# Patient Record
Sex: Female | Born: 1950
Health system: Southern US, Community
[De-identification: ages and names within clinical notes are randomized; demographics above are authoritative.]

## PROBLEM LIST (undated history)

## (undated) DIAGNOSIS — I2510 Atherosclerotic heart disease of native coronary artery without angina pectoris: Secondary | ICD-10-CM

## (undated) DIAGNOSIS — K551 Chronic vascular disorders of intestine: Secondary | ICD-10-CM

## (undated) DIAGNOSIS — K219 Gastro-esophageal reflux disease without esophagitis: Secondary | ICD-10-CM

## (undated) DIAGNOSIS — E119 Type 2 diabetes mellitus without complications: Secondary | ICD-10-CM

## (undated) DIAGNOSIS — N189 Chronic kidney disease, unspecified: Secondary | ICD-10-CM

## (undated) DIAGNOSIS — R112 Nausea with vomiting, unspecified: Secondary | ICD-10-CM

## (undated) DIAGNOSIS — N200 Calculus of kidney: Secondary | ICD-10-CM

## (undated) DIAGNOSIS — Z9889 Other specified postprocedural states: Secondary | ICD-10-CM

## (undated) DIAGNOSIS — I5189 Other ill-defined heart diseases: Secondary | ICD-10-CM

## (undated) DIAGNOSIS — D649 Anemia, unspecified: Secondary | ICD-10-CM

## (undated) DIAGNOSIS — I10 Essential (primary) hypertension: Secondary | ICD-10-CM

## (undated) DIAGNOSIS — F32A Depression, unspecified: Secondary | ICD-10-CM

## (undated) DIAGNOSIS — M1990 Unspecified osteoarthritis, unspecified site: Secondary | ICD-10-CM

## (undated) DIAGNOSIS — J189 Pneumonia, unspecified organism: Secondary | ICD-10-CM

## (undated) DIAGNOSIS — J45909 Unspecified asthma, uncomplicated: Secondary | ICD-10-CM

## (undated) DIAGNOSIS — Z87442 Personal history of urinary calculi: Secondary | ICD-10-CM

## (undated) HISTORY — DX: Calculus of kidney: N20.0

## (undated) HISTORY — PX: ABDOMINAL HYSTERECTOMY: SHX81

## (undated) HISTORY — PX: BREAST BIOPSY: SHX20

## (undated) HISTORY — PX: REDUCTION MAMMAPLASTY: SUR839

## (undated) HISTORY — DX: Chronic vascular disorders of intestine: K55.1

## (undated) HISTORY — PX: TONSILLECTOMY: SUR1361

## (undated) HISTORY — DX: Atherosclerotic heart disease of native coronary artery without angina pectoris: I25.10

## (undated) HISTORY — DX: Other ill-defined heart diseases: I51.89

---

## 2004-07-05 ENCOUNTER — Ambulatory Visit: Payer: Self-pay

## 2004-07-25 ENCOUNTER — Ambulatory Visit: Payer: Self-pay | Admitting: Internal Medicine

## 2005-05-15 ENCOUNTER — Ambulatory Visit: Payer: Self-pay | Admitting: Unknown Physician Specialty

## 2005-05-16 ENCOUNTER — Ambulatory Visit: Payer: Self-pay | Admitting: Unknown Physician Specialty

## 2005-10-09 ENCOUNTER — Ambulatory Visit: Payer: Self-pay | Admitting: Internal Medicine

## 2005-10-24 ENCOUNTER — Ambulatory Visit: Payer: Self-pay | Admitting: Internal Medicine

## 2005-11-28 ENCOUNTER — Ambulatory Visit: Payer: Self-pay | Admitting: Internal Medicine

## 2006-07-07 ENCOUNTER — Emergency Department: Payer: Self-pay | Admitting: Emergency Medicine

## 2006-08-28 ENCOUNTER — Ambulatory Visit: Payer: Self-pay | Admitting: Internal Medicine

## 2008-07-05 ENCOUNTER — Ambulatory Visit: Payer: Self-pay | Admitting: Internal Medicine

## 2008-07-12 ENCOUNTER — Ambulatory Visit: Payer: Self-pay | Admitting: Internal Medicine

## 2009-01-23 ENCOUNTER — Ambulatory Visit: Payer: Self-pay | Admitting: Internal Medicine

## 2009-02-27 ENCOUNTER — Emergency Department: Payer: Self-pay | Admitting: Emergency Medicine

## 2009-07-04 ENCOUNTER — Other Ambulatory Visit: Payer: Self-pay | Admitting: Internal Medicine

## 2009-07-07 ENCOUNTER — Other Ambulatory Visit: Payer: Self-pay | Admitting: Internal Medicine

## 2009-08-22 ENCOUNTER — Ambulatory Visit: Payer: Self-pay | Admitting: Internal Medicine

## 2010-01-17 ENCOUNTER — Encounter: Payer: Self-pay | Admitting: General Practice

## 2010-01-26 ENCOUNTER — Ambulatory Visit: Payer: Self-pay | Admitting: General Practice

## 2010-01-28 ENCOUNTER — Encounter: Payer: Self-pay | Admitting: General Practice

## 2010-02-28 ENCOUNTER — Encounter: Payer: Self-pay | Admitting: General Practice

## 2010-04-26 ENCOUNTER — Other Ambulatory Visit: Payer: Self-pay | Admitting: Internal Medicine

## 2010-10-03 ENCOUNTER — Ambulatory Visit: Payer: Self-pay

## 2011-10-10 ENCOUNTER — Ambulatory Visit: Payer: Self-pay | Admitting: Surgery

## 2012-02-17 ENCOUNTER — Ambulatory Visit: Payer: Self-pay | Admitting: Internal Medicine

## 2012-05-27 ENCOUNTER — Ambulatory Visit: Payer: Self-pay | Admitting: Internal Medicine

## 2012-07-27 ENCOUNTER — Ambulatory Visit: Payer: Self-pay | Admitting: Bariatrics

## 2012-07-31 ENCOUNTER — Ambulatory Visit: Payer: Self-pay | Admitting: Bariatrics

## 2012-08-30 ENCOUNTER — Ambulatory Visit: Payer: Self-pay | Admitting: Bariatrics

## 2012-09-03 ENCOUNTER — Ambulatory Visit: Payer: Self-pay | Admitting: Bariatrics

## 2012-09-03 LAB — COMPREHENSIVE METABOLIC PANEL
Albumin: 3.5 g/dL (ref 3.4–5.0)
Alkaline Phosphatase: 89 U/L (ref 50–136)
Anion Gap: 6 — ABNORMAL LOW (ref 7–16)
BUN: 12 mg/dL (ref 7–18)
Bilirubin,Total: 0.3 mg/dL (ref 0.2–1.0)
Calcium, Total: 9.2 mg/dL (ref 8.5–10.1)
Chloride: 104 mmol/L (ref 98–107)
Co2: 27 mmol/L (ref 21–32)
Creatinine: 0.65 mg/dL (ref 0.60–1.30)
EGFR (African American): >60
EGFR (Non-African Amer.): >60
Glucose: 101 mg/dL — ABNORMAL HIGH (ref 65–99)
Osmolality: 274 (ref 275–301)
Potassium: 4 mmol/L (ref 3.5–5.1)
SGOT(AST): 31 U/L (ref 15–37)
SGPT (ALT): 40 U/L (ref 12–78)
Sodium: 137 mmol/L (ref 136–145)
Total Protein: 7.8 g/dL (ref 6.4–8.2)

## 2012-09-03 LAB — CBC WITH DIFFERENTIAL/PLATELET
Basophil #: 0 10*3/uL (ref 0.0–0.1)
Basophil %: 0.5 %
Eosinophil #: 0.1 10*3/uL (ref 0.0–0.7)
Eosinophil %: 1.8 %
HCT: 39.6 % (ref 35.0–47.0)
HGB: 13.2 g/dL (ref 12.0–16.0)
Lymphocyte #: 1.6 10*3/uL (ref 1.0–3.6)
Lymphocyte %: 29 %
MCH: 28.6 pg (ref 26.0–34.0)
MCHC: 33.3 g/dL (ref 32.0–36.0)
MCV: 86 fL (ref 80–100)
Monocyte #: 0.5 x10 3/mm (ref 0.2–0.9)
Monocyte %: 9.4 %
Neutrophil #: 3.3 10*3/uL (ref 1.4–6.5)
Neutrophil %: 59.3 %
Platelet: 164 10*3/uL (ref 150–440)
RBC: 4.6 10*6/uL (ref 3.80–5.20)
RDW: 13.1 % (ref 11.5–14.5)
WBC: 5.6 10*3/uL (ref 3.6–11.0)

## 2012-09-03 LAB — LIPASE, BLOOD: Lipase: 122 U/L (ref 73–393)

## 2012-09-03 LAB — TSH: Thyroid Stimulating Horm: 2.4 u[IU]/mL

## 2012-09-03 LAB — MAGNESIUM: Magnesium: 1.9 mg/dL

## 2012-09-03 LAB — PROTIME-INR
INR: 0.9
Prothrombin Time: 12.7 secs (ref 11.5–14.7)

## 2012-09-03 LAB — IRON AND TIBC
Iron Bind.Cap.(Total): 290 ug/dL (ref 250–450)
Iron Saturation: 21 %
Iron: 61 ug/dL (ref 50–170)
Unbound Iron-Bind.Cap.: 229 ug/dL

## 2012-09-03 LAB — AMYLASE: Amylase: 52 U/L (ref 25–115)

## 2012-09-03 LAB — FERRITIN: Ferritin (ARMC): 299 ng/mL (ref 8–388)

## 2012-09-03 LAB — PHOSPHORUS: Phosphorus: 3.2 mg/dL (ref 2.5–4.9)

## 2012-09-03 LAB — HEMOGLOBIN A1C: Hemoglobin A1C: 5.7 % (ref 4.2–6.3)

## 2012-09-03 LAB — APTT: Activated PTT: 28.5 secs (ref 23.6–35.9)

## 2012-09-03 LAB — FOLATE: Folic Acid: 19.3 ng/mL (ref 3.1–100.0)

## 2012-09-03 LAB — BILIRUBIN, DIRECT: Bilirubin, Direct: <0.1 mg/dL (ref 0.00–0.20)

## 2012-09-30 ENCOUNTER — Ambulatory Visit: Payer: Self-pay | Admitting: Bariatrics

## 2012-11-03 ENCOUNTER — Ambulatory Visit: Payer: Self-pay | Admitting: Internal Medicine

## 2012-11-26 ENCOUNTER — Ambulatory Visit: Payer: Self-pay | Admitting: Internal Medicine

## 2012-12-08 ENCOUNTER — Ambulatory Visit: Payer: Self-pay | Admitting: Bariatrics

## 2012-12-15 ENCOUNTER — Inpatient Hospital Stay: Payer: Self-pay | Admitting: Bariatrics

## 2012-12-15 LAB — CREATININE, SERUM
Creatinine: 0.6 mg/dL (ref 0.60–1.30)
EGFR (African American): >60
EGFR (Non-African Amer.): >60

## 2012-12-16 LAB — CBC WITH DIFFERENTIAL/PLATELET
Basophil #: 0 10*3/uL (ref 0.0–0.1)
Basophil %: 0.4 %
Eosinophil #: 0 10*3/uL (ref 0.0–0.7)
Eosinophil %: 0.7 %
HCT: 36 % (ref 35.0–47.0)
HGB: 11.9 g/dL — ABNORMAL LOW (ref 12.0–16.0)
Lymphocyte #: 1 10*3/uL (ref 1.0–3.6)
Lymphocyte %: 17.7 %
MCH: 28.2 pg (ref 26.0–34.0)
MCHC: 33 g/dL (ref 32.0–36.0)
MCV: 85 fL (ref 80–100)
Monocyte #: 0.5 x10 3/mm (ref 0.2–0.9)
Monocyte %: 9.8 %
Neutrophil #: 4 10*3/uL (ref 1.4–6.5)
Neutrophil %: 71.4 %
Platelet: 123 10*3/uL — ABNORMAL LOW (ref 150–440)
RBC: 4.23 10*6/uL (ref 3.80–5.20)
RDW: 12.9 % (ref 11.5–14.5)
WBC: 5.6 10*3/uL (ref 3.6–11.0)

## 2012-12-16 LAB — BASIC METABOLIC PANEL
Anion Gap: 8 (ref 7–16)
BUN: 6 mg/dL — ABNORMAL LOW (ref 7–18)
Calcium, Total: 8 mg/dL — ABNORMAL LOW (ref 8.5–10.1)
Chloride: 103 mmol/L (ref 98–107)
Co2: 25 mmol/L (ref 21–32)
Creatinine: 0.71 mg/dL (ref 0.60–1.30)
EGFR (African American): >60
EGFR (Non-African Amer.): >60
Glucose: 123 mg/dL — ABNORMAL HIGH (ref 65–99)
Osmolality: 271 (ref 275–301)
Potassium: 3.9 mmol/L (ref 3.5–5.1)
Sodium: 136 mmol/L (ref 136–145)

## 2012-12-16 LAB — ALBUMIN: Albumin: 3 g/dL — ABNORMAL LOW (ref 3.4–5.0)

## 2012-12-16 LAB — MAGNESIUM: Magnesium: 1.7 mg/dL — ABNORMAL LOW

## 2012-12-16 LAB — PHOSPHORUS: Phosphorus: 2.5 mg/dL (ref 2.5–4.9)

## 2012-12-21 LAB — PATHOLOGY REPORT

## 2012-12-23 ENCOUNTER — Other Ambulatory Visit: Payer: Self-pay | Admitting: Bariatrics

## 2012-12-23 LAB — COMPREHENSIVE METABOLIC PANEL
Albumin: 3.4 g/dL (ref 3.4–5.0)
Alkaline Phosphatase: 65 U/L (ref 50–136)
Anion Gap: 7 (ref 7–16)
BUN: 9 mg/dL (ref 7–18)
Bilirubin,Total: 0.4 mg/dL (ref 0.2–1.0)
Calcium, Total: 9.2 mg/dL (ref 8.5–10.1)
Chloride: 104 mmol/L (ref 98–107)
Co2: 26 mmol/L (ref 21–32)
Creatinine: 0.72 mg/dL (ref 0.60–1.30)
Glucose: 93 mg/dL (ref 65–99)
Osmolality: 272 (ref 275–301)
Potassium: 3.8 mmol/L (ref 3.5–5.1)
SGOT(AST): 31 U/L (ref 15–37)
SGPT (ALT): 39 U/L (ref 12–78)
Sodium: 137 mmol/L (ref 136–145)
Total Protein: 7.3 g/dL (ref 6.4–8.2)

## 2012-12-23 LAB — CBC WITH DIFFERENTIAL/PLATELET
Basophil #: 0 10*3/uL (ref 0.0–0.1)
Basophil %: 0.7 %
Eosinophil #: 0.4 10*3/uL (ref 0.0–0.7)
Eosinophil %: 8 %
HCT: 40.6 % (ref 35.0–47.0)
HGB: 13.6 g/dL (ref 12.0–16.0)
Lymphocyte #: 1 10*3/uL (ref 1.0–3.6)
Lymphocyte %: 18.2 %
MCH: 28.5 pg (ref 26.0–34.0)
MCHC: 33.5 g/dL (ref 32.0–36.0)
MCV: 85 fL (ref 80–100)
Monocyte #: 0.6 x10 3/mm (ref 0.2–0.9)
Monocyte %: 11.5 %
Neutrophil #: 3.3 10*3/uL (ref 1.4–6.5)
Neutrophil %: 61.6 %
Platelet: 191 10*3/uL (ref 150–440)
RBC: 4.77 10*6/uL (ref 3.80–5.20)
RDW: 13.3 % (ref 11.5–14.5)
WBC: 5.3 10*3/uL (ref 3.6–11.0)

## 2012-12-23 LAB — CLOSTRIDIUM DIFFICILE BY PCR

## 2013-01-07 ENCOUNTER — Ambulatory Visit: Payer: Self-pay | Admitting: Bariatrics

## 2013-01-28 ENCOUNTER — Ambulatory Visit: Payer: Self-pay | Admitting: Bariatrics

## 2013-06-21 ENCOUNTER — Other Ambulatory Visit: Payer: Self-pay | Admitting: General Practice

## 2013-06-21 LAB — COMPREHENSIVE METABOLIC PANEL
Albumin: 3.5 g/dL (ref 3.4–5.0)
Alkaline Phosphatase: 99 U/L (ref 50–136)
Anion Gap: 5 — ABNORMAL LOW (ref 7–16)
BUN: 20 mg/dL — ABNORMAL HIGH (ref 7–18)
Bilirubin,Total: 0.2 mg/dL (ref 0.2–1.0)
Calcium, Total: 8.8 mg/dL (ref 8.5–10.1)
Chloride: 106 mmol/L (ref 98–107)
Co2: 27 mmol/L (ref 21–32)
Creatinine: 0.74 mg/dL (ref 0.60–1.30)
EGFR (African American): >60
EGFR (Non-African Amer.): >60
Glucose: 93 mg/dL (ref 65–99)
Osmolality: 278 (ref 275–301)
Potassium: 4.2 mmol/L (ref 3.5–5.1)
SGOT(AST): 30 U/L (ref 15–37)
SGPT (ALT): 28 U/L (ref 12–78)
Sodium: 138 mmol/L (ref 136–145)
Total Protein: 7.6 g/dL (ref 6.4–8.2)

## 2013-06-21 LAB — PHOSPHORUS: Phosphorus: 3.1 mg/dL (ref 2.5–4.9)

## 2013-06-21 LAB — CBC WITH DIFFERENTIAL/PLATELET
Basophil #: 0 10*3/uL (ref 0.0–0.1)
Basophil %: 0.6 %
Eosinophil #: 0.2 10*3/uL (ref 0.0–0.7)
Eosinophil %: 2.5 %
HCT: 38.2 % (ref 35.0–47.0)
HGB: 13.1 g/dL (ref 12.0–16.0)
Lymphocyte #: 2.1 10*3/uL (ref 1.0–3.6)
Lymphocyte %: 33.7 %
MCH: 29.3 pg (ref 26.0–34.0)
MCHC: 34.3 g/dL (ref 32.0–36.0)
MCV: 85 fL (ref 80–100)
Monocyte #: 0.5 x10 3/mm (ref 0.2–0.9)
Monocyte %: 8.4 %
Neutrophil #: 3.4 10*3/uL (ref 1.4–6.5)
Neutrophil %: 54.8 %
Platelet: 133 10*3/uL — ABNORMAL LOW (ref 150–440)
RBC: 4.47 10*6/uL (ref 3.80–5.20)
RDW: 13.4 % (ref 11.5–14.5)
WBC: 6.2 10*3/uL (ref 3.6–11.0)

## 2013-06-21 LAB — MAGNESIUM: Magnesium: 1.9 mg/dL

## 2013-06-21 LAB — FOLATE: Folic Acid: 15 ng/mL (ref 3.1–100.0)

## 2013-06-21 LAB — IRON: Iron: 68 ug/dL (ref 50–170)

## 2013-06-21 LAB — FERRITIN: Ferritin (ARMC): 284 ng/mL (ref 8–388)

## 2013-06-21 LAB — AMYLASE: Amylase: 59 U/L (ref 25–115)

## 2014-01-05 ENCOUNTER — Other Ambulatory Visit: Payer: Self-pay | Admitting: Bariatrics

## 2014-01-05 LAB — CBC WITH DIFFERENTIAL/PLATELET
Basophil #: 0 x10 3/mm 3
Basophil %: 0.8 %
Eosinophil #: 0.1 x10 3/mm 3
Eosinophil %: 1.9 %
HCT: 40.6 %
HGB: 13.6 g/dL
Lymphocyte %: 42 %
Lymphs Abs: 2.2 x10 3/mm 3
MCH: 29.1 pg
MCHC: 33.6 g/dL
MCV: 87 fL
Monocyte #: 0.6 "x10 3/mm "
Monocyte %: 10.7 %
Neutrophil #: 2.3 x10 3/mm 3
Neutrophil %: 44.6 %
Platelet: 137 x10 3/mm 3 — ABNORMAL LOW
RBC: 4.7 X10 6/mm 3
RDW: 12.8 %
WBC: 5.3 x10 3/mm 3

## 2014-01-05 LAB — AMYLASE: Amylase: 63 U/L (ref 25–115)

## 2014-01-05 LAB — COMPREHENSIVE METABOLIC PANEL
Albumin: 3.7 g/dL (ref 3.4–5.0)
Alkaline Phosphatase: 81 U/L
Anion Gap: 6 — ABNORMAL LOW (ref 7–16)
BUN: 24 mg/dL — ABNORMAL HIGH (ref 7–18)
Bilirubin,Total: 0.3 mg/dL (ref 0.2–1.0)
Calcium, Total: 8.7 mg/dL (ref 8.5–10.1)
Chloride: 102 mmol/L (ref 98–107)
Co2: 27 mmol/L (ref 21–32)
Creatinine: 0.56 mg/dL — ABNORMAL LOW (ref 0.60–1.30)
EGFR (African American): >60
EGFR (Non-African Amer.): >60
Glucose: 77 mg/dL (ref 65–99)
Osmolality: 273 (ref 275–301)
Potassium: 3.9 mmol/L (ref 3.5–5.1)
SGOT(AST): 29 U/L (ref 15–37)
SGPT (ALT): 31 U/L (ref 12–78)
Sodium: 135 mmol/L — ABNORMAL LOW (ref 136–145)
Total Protein: 7.6 g/dL (ref 6.4–8.2)

## 2014-01-05 LAB — IRON: Iron: 58 ug/dL (ref 50–170)

## 2014-01-05 LAB — FOLATE: Folic Acid: 12.9 ng/mL

## 2014-01-05 LAB — PHOSPHORUS: Phosphorus: 3.9 mg/dL (ref 2.5–4.9)

## 2014-01-05 LAB — MAGNESIUM: Magnesium: 1.7 mg/dL — ABNORMAL LOW

## 2014-01-05 LAB — FERRITIN: Ferritin (ARMC): 237 ng/mL (ref 8–388)

## 2014-06-29 DIAGNOSIS — R1011 Right upper quadrant pain: Secondary | ICD-10-CM | POA: Insufficient documentation

## 2014-06-30 ENCOUNTER — Ambulatory Visit: Payer: Self-pay | Admitting: Gastroenterology

## 2014-09-14 ENCOUNTER — Encounter (INDEPENDENT_AMBULATORY_CARE_PROVIDER_SITE_OTHER): Payer: Self-pay | Admitting: Ophthalmology

## 2014-09-19 ENCOUNTER — Ambulatory Visit: Payer: Self-pay | Admitting: Unknown Physician Specialty

## 2014-10-06 ENCOUNTER — Encounter (INDEPENDENT_AMBULATORY_CARE_PROVIDER_SITE_OTHER): Payer: Self-pay | Admitting: Ophthalmology

## 2015-01-04 ENCOUNTER — Encounter: Payer: Self-pay | Admitting: *Deleted

## 2015-01-20 NOTE — Op Note (Signed)
PATIENT NAME:  April ChandlerDANLEY, Priscella B MR#:  [id number] DATE OF BIRTH:  10-25-1950  DATE OF PROCEDURE:  12/15/2012  PREOPERATIVE DIAGNOSIS: Morbid obesity with a body mass index of 44 hypertension, diabetes and presence of a hiatal hernia.   POSTOPERATIVE DIAGNOSIS:   Morbid obesity with a body mass index of 44 hypertension, diabetes and presence of a hiatal hernia.  PROCEDURE PERFORMED:  Laparoscopic sleeve gastrectomy with repair of hiatal hernia.  PHYSICIANS IN ATTENDANCE:  Effie ShyMichael Tyner, MD  PROCEDURE: The patient was brought to the operating room and placed in supine position. General anesthesia obtained with orotracheal intubation and a Foley catheter inserted sterilely. TED hose and Thromboguards were applied and a foot board applied at the end of the operative bed. The patient then had prep and drape of the lower chest and abdomen. A 5 mm Optiview was introduced under direct visualization in the left upper quadrant of the abdomen. Pneumoperitoneum was obtained with carbon dioxide. Three additional trocars were introduced across the upper abdomen. A Nathanson liver retractor introduced through a subxiphoid wound. The left lobe of the liver was elevated.  It should be noted that the patient did have evidence of moderate fibrosis of the liver consistent with cirrhosis. During the completion of the procedure, the patient did have core biopsies of the liver. This was not mentioned in the procedure  terminology.   Following elevation of the left lobe of the liver, the patient was noted to have a small hiatal hernia that was appreciated on preoperative upper endoscopy. The patient had division of the gastric pedicles along the greater curvature of the stomach beginning approximately 4 cm proximal to the pylorus. This was then extended superiorly with full division of the gastrocolic and gastrosplenic ligaments.  The upper fundus was freed from the undersurface of the left hemidiaphragm by use of the Harmonic  scalpel. The very apex of the stomach was lying within the lower mediastinum through the hiatus. Thin phrenoesophageal ligaments were divided along the medial margin of the left crural musculature. The patient then had division of the gastrohepatic ligament by use of the Harmonic scalpel. The peritoneum was incised just lateral to the right crural margin and blunt dissection was then used to reduce the herniated lesser sac fatty tissue.  Next, the patient underwent circumferential dissection of the lower esophagus from the mediastinum, separating the esophagus and vagal nerves from the underlying aorta, the pleural surfaces and the overlying pericardium. This was extended into the lower mediastinum over a distance of approximately 6 to 7 cm.  Ultimately we were able to deliver 2 cm of esophagus lying comfortably in the abdominal cavity. Two interrupted 0 Ethibond sutures were then used to approximate the crural musculature posteriorly. The patient at this time had a 4634 French bougie passed transorally and was directed down until the level of the antrum. This was then used as a template for creation of a medially based gastric sleeve or tube effect. The first firing of the GIA stapler was a green load Ethicon GIA stapler placed in a relative transverse direction in an effort to avoid any narrowing at the level of the incisura. Next, a somewhat more vertical line of staples were placed paralleling the lesser curvature and brought out just lateral to the angle of His. This resulted in a smooth transition from staple line to staple line.  There was a small dog ear of stomach left intact nearer than lateral to the angle of His. Next, the divided aspects of the  gastrosplenic and gastrocolic ligament were then secured to the lateral margin of the gastric sleeve and after fixating the stomach in the unbent reherniation into the lower mediastinum and a torsion of the sleeve.   At this time the lateral stomach delivered  through the right upper quadrant 15 mm trocar site. The fascia and peritoneum of this defect was then closed with a 0 Vicryl suture passed by way of a needle suture passing system under direct visualization. The staple line inspected for hemostasis and found to be excellent. The pneumoperitoneum was relieved at this time. The trocars were removed. It should be noted prior to complain reduction of the pneumoperitoneum a core biopsy of the left lobe of the liver was performed with a Tru-Cut needle biopsy system with 2 samples being retrieved. The wall of the liver was treated with Harmonic scalpel to achieve hemostasis at a needle puncture sites. Again, at this time the pneumoperitoneum was relieved. The sites were injected with 0.25 Marcaine followed by 4-0 Monocryl in the dermis followed by Dermabond. The patient was allowed to recover at this time having tolerated the procedure well with minimal blood loss.     ____________________________ Tyrone Apple Alva Garnet, MD mat:ct D: 12/15/2012 13:34:10 ET T: 12/15/2012 14:00:53 ET JOB#: [id number]  cc: Casimiro Needle A. Alva Garnet, MD, <Dictator> Tauseef G. Kathi Ludwig, MD Everette Rank MD ELECTRONICALLY SIGNED 12/16/2012 21:16

## 2015-01-20 NOTE — Consult Note (Signed)
Brief Consult Note: Diagnosis: Outpatient insulin prescription, DM, HTN.   Patient was seen by consultant.   Consult note dictated.   Orders entered.   Discussed with Attending MD.   Comments: 64y/o F with morbid obesity, HTn and DM who had a sleeve gastrectomy surgery and is being discharged home today, Medicine consult requested for outpt insulin prescription.  * DM- was on victoza at home, now that she had surgery and sugars well controlled in the hospital, also will be on liquid diet only for the next 2 weeks Per Dr. Alva Garnetyner, pt also had some weight loss lately. d/c victoza for now, started on humaLog kwikpen sliding scale adn gave instructions. pt has an appt with her endocrinologist Dr. Ellie LunchBhakti paul in 10days and advised to keep the appt  * HTN- also BP running low, advised to hold BP meds for now  * Morbid obesity- s/p sleeve gastrectomy sx. f/u with Dr. Alva Garnetyner.  Electronic Signatures: Enid BaasKalisetti, Kaylan Friedmann (MD)  (Signed 19-Mar-14 15:41)  Authored: Brief Consult Note   Last Updated: 19-Mar-14 15:41 by Enid BaasKalisetti, Jenai Scaletta (MD)

## 2015-01-20 NOTE — Consult Note (Signed)
PATIENT NAME:  April ChandlerDANLEY, Amariss B MR#:  [id number] DATE OF BIRTH:  Mar 23, 1951  DATE OF CONSULTATION:  12/16/2012  ADMITTING  PHYSICIAN:  Effie ShyMichael Tyner, MD  CONSULTING PHYSICIAN:  Enid Baasadhika Cruzito Standre, MD PRIMARY CARE PHYSICIAN: Corky DownsJaved Masoud, MD  PRIMARY ENDOCRINOLOGIST: Ellie LunchBhakti Paul, MD   REASON FOR CONSULTATION: Insulin regimen for discharge.  BRIEF HISTORY: The patient is a 64 year old morbidly obese female with a past medical history significant for hypertension, diabetes mellitus and morbid obesity, got admitted to the hospital by Dr. Alva Garnetyner for a scheduled sleeve gastrectomy procedure and repair of hiatal hernia.  She had her procedure done on 12/15/2012, did not have any postop complications. The patient has been a known diabetic for the past 10 years, but sugars in the hospital have been running in the 100 to 120 range. She will be going home on a liquid diet, and Dr. Alva Garnetyner requested outpatient diabetes management after the surgery to avoid hypoglycemia. The patient was seeing Dr. Renae FicklePaul from an endocrinology standpoint, she has been on Victoza 1.8 mg for the past 2 years. Her blood pressure has also been low normal postoperatively, and she was taking Benicar/HCTZ and verapamil.   PAST MEDICAL HISTORY: 1. Obesity.  2. Hypertension.  3. Diabetes mellitus.  4. Asthma.   PAST SURGICAL HISTORY: 1. Tonsillectomy.  2. Tubal ligation.  3. Hysterectomy with bilateral salpingo-oophorectomy. 4. Sleeve gastrectomy procedure done yesterday.   ALLERGIES TO MEDICATIONS: CODEINE, SULFA, AND PENICILLIN.   HOME MEDICATIONS: 1. Albuterol inhaler 2 puffs 4 times a day as needed for wheezing.  2. Estradiol 1 mg daily.  3. Lyrica 50 mg p.o. daily.  4. Zoloft 100 mg p.o. daily.  5. Victoza 1.8 mg daily.  6. Verapamil 80 mg 3 times a day.  7. Benicar/HCTZ 40/12.5 mg 1 tablet p.o. daily.   SOCIAL HISTORY: She lives at home with her husband. No history of smoking or alcohol use.   FAMILY HISTORY:  Significant for diabetes. Mom with coronary artery disease and also brain tumor.   REVIEW OF SYSTEMS:  CONSTITUTIONAL: No fever, fatigue or weakness.  EYES: No blurred vision, glaucoma, inflammation or cataracts.  ENT: No tinnitus, ear pain, epistaxis or discharge.  RESPIRATORY: No cough, wheeze, hemoptysis or COPD.  CARDIOVASCULAR: No chest pain, orthopnea, edema, arrhythmia, palpitations or syncope.  GASTROINTESTINAL: Positive for some nausea. No vomiting. No diarrhea. Positive for abdominal pain from her surgery. No hematemesis or melena.  GENITOURINARY: No dysuria, hematuria, renal calculus, frequency or incontinence.  ENDOCRINE: No polyuria, nocturia, thyroid problems, heat or cold intolerance.  HEMATOLOGY: No anemia, easy bruising or bleeding.  SKIN: No acne, rash or lesions.  MUSCULOSKELETAL: No neck, back, shoulder pain, arthritis or gout.  NEUROLOGICAL: No numbness, weakness, CVA, TIA, or seizures.  PSYCHOLOGICAL: No anxiety, insomnia, or depression.   PHYSICAL EXAMINATION: VITAL SIGNS: Temperature 98.3 degrees Fahrenheit, pulse 69, respirations 18, blood pressure 117/60, pulse oximetry 92% on room air.  GENERAL: A heavily-built, well-nourished female lying in bed, not in any acute distress.  HEENT: Normocephalic, atraumatic. Pupils are equal, round, reacting to light. Anicteric sclerae. Extraocular movements intact. Oropharynx clear without erythema, mass or exudates.  NECK: Supple, short and thick.  No thyromegaly, JVD or carotid bruits.  LYMPH: No lymphadenopathy.  LUNGS: Moving air bilaterally, clear breath sounds. No wheeze or crackles. No use of accessory muscles for breathing.  CARDIOVASCULAR: S1, S2, regular rate and rhythm. No murmurs, rubs, or gallops.   ABDOMEN: Soft, mild discomfort and soreness at the surgical site. She had laparoscopic  procedures, so multiple laparoscopic incisions are present. wound healing well. Normal bowel sounds.  EXTREMITIES: No pedal edema. No  clubbing or cyanosis, 2+ dorsalis pedis pulses palpable bilaterally.  SKIN: No acne, rash or lesions.  LYMPHATICS: No cervical lymphadenopathy.  NEUROLOGICAL: Cranial nerves intact. No focal motor or sensory deficits.  PSYCHOLOGICAL: The patient is awake, alert, oriented x 3.   LABORATORY DATA:  WBC is 5.6, hemoglobin 11.9, hematocrit 36.0, platelet count 123.  Sodium 136, potassium 3.9, chloride 103, bicarbonate 25, BUN 6, creatinine 0.71, glucose 123 and calcium of 8.0. Magnesium 1.7, phosphorus 2.5, albumin 3.0.   RECOMMENDATIONS: A 64 year old female with obesity, hypertension, diabetes, who had a sleeve gastrectomy done yesterday, being discharged today. Medicine consult requested for outpatient insulin prescription.   1. Diabetes mellitus: She was on Victoza at home.  Since she had the surgery, sugars are very well controlled, and she will be on a liquid diet for the next 2 weeks. Also, she has had recent weight loss, so we will discontinue the Victoza.  She was started on Humalog KwikPen sliding scale and given instructions. The patient already has an appointment with her endocrinologist, Dr. Ellie Lunch, in the next 10 days and was advised to keep the appointment.  2. Hypertension:  Also blood pressure is running low after her surgery, and she will be on a liquid diet; so, we will hold her Benicar/HCTZ and verapamil for now.  3. Asthma: Continue inhaler, currently appears stable. No need for any steroids.  4. Morbid obesity, status post sleeve gastrectomy:  Follow up with Dr. Effie Shy as scheduled.   CODE STATUS:  FULL CODE.          TIME SPENT IN CONSULTATION: 50 minutes.  ____________________________ Enid Baas, MD rk:cb D: 12/16/2012 15:40:57 ET T: 12/16/2012 15:55:18 ET JOB#: [id number]  cc: Enid Baas, MD, <Dictator> Bhakti B. Renae Fickle, MD Corky Downs, MD Enid Baas MD ELECTRONICALLY SIGNED 01/01/2013 14:43

## 2015-01-23 LAB — SURGICAL PATHOLOGY

## 2015-05-12 ENCOUNTER — Other Ambulatory Visit
Admission: RE | Admit: 2015-05-12 | Discharge: 2015-05-12 | Disposition: A | Discharge status: Home / Self Care | Payer: 59 | Source: Ambulatory Visit | Attending: Psychiatry | Admitting: Psychiatry

## 2015-05-12 DIAGNOSIS — F331 Major depressive disorder, recurrent, moderate: Secondary | ICD-10-CM | POA: Diagnosis not present

## 2015-05-12 LAB — FOLATE: Folate: 15.1 ng/mL (ref >5.9)

## 2015-05-12 LAB — VITAMIN B12: Vitamin B-12: 3067 pg/mL — ABNORMAL HIGH (ref 180–914)

## 2015-05-12 LAB — TSH: TSH: 2.193 u[IU]/mL (ref 0.350–4.500)

## 2015-10-04 DIAGNOSIS — H547 Unspecified visual loss: Secondary | ICD-10-CM | POA: Diagnosis not present

## 2015-10-04 DIAGNOSIS — H35371 Puckering of macula, right eye: Secondary | ICD-10-CM | POA: Diagnosis not present

## 2015-10-11 DIAGNOSIS — Z09 Encounter for follow-up examination after completed treatment for conditions other than malignant neoplasm: Secondary | ICD-10-CM | POA: Diagnosis not present

## 2015-10-11 DIAGNOSIS — F4310 Post-traumatic stress disorder, unspecified: Secondary | ICD-10-CM | POA: Diagnosis not present

## 2015-10-11 DIAGNOSIS — F331 Major depressive disorder, recurrent, moderate: Secondary | ICD-10-CM | POA: Diagnosis not present

## 2015-10-11 DIAGNOSIS — H35371 Puckering of macula, right eye: Secondary | ICD-10-CM | POA: Diagnosis not present

## 2015-10-14 DIAGNOSIS — J069 Acute upper respiratory infection, unspecified: Secondary | ICD-10-CM | POA: Diagnosis not present

## 2015-10-17 DIAGNOSIS — H40051 Ocular hypertension, right eye: Secondary | ICD-10-CM | POA: Diagnosis not present

## 2015-10-17 DIAGNOSIS — H1131 Conjunctival hemorrhage, right eye: Secondary | ICD-10-CM | POA: Diagnosis not present

## 2015-10-17 DIAGNOSIS — H40041 Steroid responder, right eye: Secondary | ICD-10-CM | POA: Diagnosis not present

## 2015-10-17 DIAGNOSIS — H2000 Unspecified acute and subacute iridocyclitis: Secondary | ICD-10-CM | POA: Diagnosis not present

## 2015-10-20 DIAGNOSIS — H40051 Ocular hypertension, right eye: Secondary | ICD-10-CM | POA: Diagnosis not present

## 2015-10-20 DIAGNOSIS — H2000 Unspecified acute and subacute iridocyclitis: Secondary | ICD-10-CM | POA: Diagnosis not present

## 2015-10-20 DIAGNOSIS — H1131 Conjunctival hemorrhage, right eye: Secondary | ICD-10-CM | POA: Diagnosis not present

## 2015-10-20 DIAGNOSIS — H40041 Steroid responder, right eye: Secondary | ICD-10-CM | POA: Diagnosis not present

## 2015-11-04 DIAGNOSIS — J208 Acute bronchitis due to other specified organisms: Secondary | ICD-10-CM | POA: Diagnosis not present

## 2015-11-04 DIAGNOSIS — J019 Acute sinusitis, unspecified: Secondary | ICD-10-CM | POA: Diagnosis not present

## 2015-11-04 DIAGNOSIS — B9689 Other specified bacterial agents as the cause of diseases classified elsewhere: Secondary | ICD-10-CM | POA: Diagnosis not present

## 2015-11-04 DIAGNOSIS — R300 Dysuria: Secondary | ICD-10-CM | POA: Diagnosis not present

## 2015-11-09 DIAGNOSIS — F331 Major depressive disorder, recurrent, moderate: Secondary | ICD-10-CM | POA: Diagnosis not present

## 2015-11-09 DIAGNOSIS — F4310 Post-traumatic stress disorder, unspecified: Secondary | ICD-10-CM | POA: Diagnosis not present

## 2015-11-13 DIAGNOSIS — H35371 Puckering of macula, right eye: Secondary | ICD-10-CM | POA: Diagnosis not present

## 2015-11-13 DIAGNOSIS — H35372 Puckering of macula, left eye: Secondary | ICD-10-CM | POA: Diagnosis not present

## 2015-11-28 DIAGNOSIS — H35372 Puckering of macula, left eye: Secondary | ICD-10-CM | POA: Diagnosis not present

## 2015-11-28 DIAGNOSIS — H462 Nutritional optic neuropathy: Secondary | ICD-10-CM | POA: Diagnosis not present

## 2015-11-28 DIAGNOSIS — H35371 Puckering of macula, right eye: Secondary | ICD-10-CM | POA: Diagnosis not present

## 2015-11-29 ENCOUNTER — Other Ambulatory Visit
Admission: RE | Admit: 2015-11-29 | Discharge: 2015-11-29 | Disposition: A | Discharge status: Home / Self Care | Payer: 59 | Source: Ambulatory Visit | Attending: Psychiatry | Admitting: Psychiatry

## 2015-11-29 DIAGNOSIS — Z79899 Other long term (current) drug therapy: Secondary | ICD-10-CM | POA: Insufficient documentation

## 2015-11-29 DIAGNOSIS — F331 Major depressive disorder, recurrent, moderate: Secondary | ICD-10-CM | POA: Diagnosis not present

## 2015-11-29 LAB — LIPID PANEL
Cholesterol: 259 mg/dL — ABNORMAL HIGH (ref 0–200)
HDL: 59 mg/dL (ref >40)
LDL Cholesterol: 151 mg/dL — ABNORMAL HIGH (ref 0–99)
Total CHOL/HDL Ratio: 4.4 RATIO
Triglycerides: 247 mg/dL — ABNORMAL HIGH (ref <150)
VLDL: 49 mg/dL — ABNORMAL HIGH (ref 0–40)

## 2016-01-12 DIAGNOSIS — F39 Unspecified mood [affective] disorder: Secondary | ICD-10-CM | POA: Diagnosis not present

## 2016-01-12 DIAGNOSIS — F4310 Post-traumatic stress disorder, unspecified: Secondary | ICD-10-CM | POA: Diagnosis not present

## 2016-01-15 DIAGNOSIS — M79671 Pain in right foot: Secondary | ICD-10-CM | POA: Diagnosis not present

## 2016-01-30 DIAGNOSIS — F39 Unspecified mood [affective] disorder: Secondary | ICD-10-CM | POA: Diagnosis not present

## 2016-01-31 DIAGNOSIS — M7671 Peroneal tendinitis, right leg: Secondary | ICD-10-CM | POA: Diagnosis not present

## 2016-01-31 DIAGNOSIS — M79671 Pain in right foot: Secondary | ICD-10-CM | POA: Diagnosis not present

## 2016-02-05 DIAGNOSIS — F331 Major depressive disorder, recurrent, moderate: Secondary | ICD-10-CM | POA: Diagnosis not present

## 2016-02-05 DIAGNOSIS — F4310 Post-traumatic stress disorder, unspecified: Secondary | ICD-10-CM | POA: Diagnosis not present

## 2016-02-12 DIAGNOSIS — F4310 Post-traumatic stress disorder, unspecified: Secondary | ICD-10-CM | POA: Diagnosis not present

## 2016-02-12 DIAGNOSIS — F331 Major depressive disorder, recurrent, moderate: Secondary | ICD-10-CM | POA: Diagnosis not present

## 2016-02-14 ENCOUNTER — Ambulatory Visit: Payer: Self-pay | Admitting: Physician Assistant

## 2016-02-14 ENCOUNTER — Encounter: Payer: Self-pay | Admitting: Physician Assistant

## 2016-02-14 VITALS — BP 152/90 | HR 80 | Temp 98.7°F

## 2016-02-14 MED ORDER — CLOTRIMAZOLE-BETAMETHASONE 1-0.05 % EX CREA: 1.0000 "application " | TOPICAL_CREAM | Freq: Two times a day (BID) | CUTANEOUS | Status: DC

## 2016-02-14 MED ORDER — FLUCONAZOLE 150 MG PO TABS: ORAL_TABLET | ORAL | Status: DC

## 2016-02-14 NOTE — Progress Notes (Signed)
S: c/o yeast under and between breasts, at [facility] [place], states has been using multiple otc powders without relief, states now her eyes are itching, ?if has fungus in her eyes, states she has macular degeneration and had surgery in Jan, no fever/chills, had recent lab work and her liver functions were ok  O: vitals wnl, nad, skin with pink inflamed areas under breasts and under panus of abdomen, some between her breasts, no drainage, n/v intact, conjunctiva of eyes is pale, no redness or drainage  A: candidiasis, itchy eyes  P: recommend pt contact her opth. Due to surgical intervention in January, lotrisone, diflucan 1 now and 1 on a week

## 2016-02-21 DIAGNOSIS — M7671 Peroneal tendinitis, right leg: Secondary | ICD-10-CM | POA: Diagnosis not present

## 2016-03-18 DIAGNOSIS — F4310 Post-traumatic stress disorder, unspecified: Secondary | ICD-10-CM | POA: Diagnosis not present

## 2016-03-18 DIAGNOSIS — F331 Major depressive disorder, recurrent, moderate: Secondary | ICD-10-CM | POA: Diagnosis not present

## 2016-03-27 DIAGNOSIS — M7671 Peroneal tendinitis, right leg: Secondary | ICD-10-CM | POA: Diagnosis not present

## 2016-04-08 DIAGNOSIS — F331 Major depressive disorder, recurrent, moderate: Secondary | ICD-10-CM | POA: Diagnosis not present

## 2016-04-15 DIAGNOSIS — F331 Major depressive disorder, recurrent, moderate: Secondary | ICD-10-CM | POA: Diagnosis not present

## 2016-04-17 DIAGNOSIS — F39 Unspecified mood [affective] disorder: Secondary | ICD-10-CM | POA: Diagnosis not present

## 2016-04-17 DIAGNOSIS — F4310 Post-traumatic stress disorder, unspecified: Secondary | ICD-10-CM | POA: Diagnosis not present

## 2016-04-22 DIAGNOSIS — F331 Major depressive disorder, recurrent, moderate: Secondary | ICD-10-CM | POA: Diagnosis not present

## 2016-05-09 DIAGNOSIS — I10 Essential (primary) hypertension: Secondary | ICD-10-CM | POA: Diagnosis not present

## 2016-05-09 DIAGNOSIS — R350 Frequency of micturition: Secondary | ICD-10-CM | POA: Diagnosis not present

## 2016-05-09 DIAGNOSIS — B369 Superficial mycosis, unspecified: Secondary | ICD-10-CM | POA: Diagnosis not present

## 2016-05-09 DIAGNOSIS — K219 Gastro-esophageal reflux disease without esophagitis: Secondary | ICD-10-CM | POA: Diagnosis not present

## 2016-05-09 DIAGNOSIS — K2960 Other gastritis without bleeding: Secondary | ICD-10-CM | POA: Diagnosis not present

## 2016-05-10 DIAGNOSIS — I10 Essential (primary) hypertension: Secondary | ICD-10-CM | POA: Diagnosis not present

## 2016-05-10 DIAGNOSIS — R5381 Other malaise: Secondary | ICD-10-CM | POA: Diagnosis not present

## 2016-05-21 DIAGNOSIS — G8929 Other chronic pain: Secondary | ICD-10-CM | POA: Diagnosis not present

## 2016-05-21 DIAGNOSIS — M2550 Pain in unspecified joint: Secondary | ICD-10-CM | POA: Diagnosis not present

## 2016-05-21 DIAGNOSIS — E785 Hyperlipidemia, unspecified: Secondary | ICD-10-CM | POA: Diagnosis not present

## 2016-05-21 DIAGNOSIS — I10 Essential (primary) hypertension: Secondary | ICD-10-CM | POA: Diagnosis not present

## 2016-05-21 DIAGNOSIS — M1990 Unspecified osteoarthritis, unspecified site: Secondary | ICD-10-CM | POA: Diagnosis not present

## 2016-05-21 DIAGNOSIS — M19042 Primary osteoarthritis, left hand: Secondary | ICD-10-CM | POA: Diagnosis not present

## 2016-05-21 DIAGNOSIS — M19041 Primary osteoarthritis, right hand: Secondary | ICD-10-CM | POA: Diagnosis not present

## 2016-05-28 DIAGNOSIS — R399 Unspecified symptoms and signs involving the genitourinary system: Secondary | ICD-10-CM | POA: Diagnosis not present

## 2016-06-04 DIAGNOSIS — G4700 Insomnia, unspecified: Secondary | ICD-10-CM | POA: Diagnosis not present

## 2016-06-04 DIAGNOSIS — F39 Unspecified mood [affective] disorder: Secondary | ICD-10-CM | POA: Diagnosis not present

## 2016-06-04 DIAGNOSIS — F4310 Post-traumatic stress disorder, unspecified: Secondary | ICD-10-CM | POA: Diagnosis not present

## 2016-06-10 DIAGNOSIS — M19041 Primary osteoarthritis, right hand: Secondary | ICD-10-CM | POA: Diagnosis not present

## 2016-06-10 DIAGNOSIS — G8929 Other chronic pain: Secondary | ICD-10-CM | POA: Insufficient documentation

## 2016-06-10 DIAGNOSIS — M2550 Pain in unspecified joint: Secondary | ICD-10-CM | POA: Diagnosis not present

## 2016-06-10 DIAGNOSIS — M79671 Pain in right foot: Secondary | ICD-10-CM | POA: Diagnosis not present

## 2016-06-11 ENCOUNTER — Other Ambulatory Visit: Payer: Self-pay | Admitting: Internal Medicine

## 2016-06-11 DIAGNOSIS — M79671 Pain in right foot: Secondary | ICD-10-CM

## 2016-06-25 ENCOUNTER — Ambulatory Visit
Admission: RE | Admit: 2016-06-25 | Discharge: 2016-06-25 | Disposition: A | Discharge status: Home / Self Care | Payer: 59 | Source: Ambulatory Visit | Attending: Internal Medicine | Admitting: Internal Medicine

## 2016-06-25 DIAGNOSIS — M19071 Primary osteoarthritis, right ankle and foot: Secondary | ICD-10-CM | POA: Diagnosis not present

## 2016-06-25 DIAGNOSIS — M7989 Other specified soft tissue disorders: Secondary | ICD-10-CM | POA: Insufficient documentation

## 2016-06-25 DIAGNOSIS — M79671 Pain in right foot: Secondary | ICD-10-CM

## 2016-06-27 DIAGNOSIS — F331 Major depressive disorder, recurrent, moderate: Secondary | ICD-10-CM | POA: Diagnosis not present

## 2016-06-27 DIAGNOSIS — F4310 Post-traumatic stress disorder, unspecified: Secondary | ICD-10-CM | POA: Diagnosis not present

## 2016-07-18 DIAGNOSIS — J983 Compensatory emphysema: Secondary | ICD-10-CM | POA: Diagnosis not present

## 2016-07-18 DIAGNOSIS — R5381 Other malaise: Secondary | ICD-10-CM | POA: Diagnosis not present

## 2016-07-18 DIAGNOSIS — E669 Obesity, unspecified: Secondary | ICD-10-CM | POA: Diagnosis not present

## 2016-07-18 DIAGNOSIS — F331 Major depressive disorder, recurrent, moderate: Secondary | ICD-10-CM | POA: Diagnosis not present

## 2016-07-18 DIAGNOSIS — K2960 Other gastritis without bleeding: Secondary | ICD-10-CM | POA: Diagnosis not present

## 2016-07-18 DIAGNOSIS — E119 Type 2 diabetes mellitus without complications: Secondary | ICD-10-CM | POA: Diagnosis not present

## 2016-08-01 DIAGNOSIS — H903 Sensorineural hearing loss, bilateral: Secondary | ICD-10-CM | POA: Diagnosis not present

## 2016-08-26 DIAGNOSIS — J983 Compensatory emphysema: Secondary | ICD-10-CM | POA: Diagnosis not present

## 2016-08-26 DIAGNOSIS — E119 Type 2 diabetes mellitus without complications: Secondary | ICD-10-CM | POA: Diagnosis not present

## 2016-08-26 DIAGNOSIS — E669 Obesity, unspecified: Secondary | ICD-10-CM | POA: Diagnosis not present

## 2016-08-26 DIAGNOSIS — E8881 Metabolic syndrome: Secondary | ICD-10-CM | POA: Diagnosis not present

## 2016-09-18 DIAGNOSIS — F5081 Binge eating disorder: Secondary | ICD-10-CM | POA: Diagnosis not present

## 2016-09-18 DIAGNOSIS — G4700 Insomnia, unspecified: Secondary | ICD-10-CM | POA: Diagnosis not present

## 2016-09-18 DIAGNOSIS — F4310 Post-traumatic stress disorder, unspecified: Secondary | ICD-10-CM | POA: Diagnosis not present

## 2016-09-18 DIAGNOSIS — F39 Unspecified mood [affective] disorder: Secondary | ICD-10-CM | POA: Diagnosis not present

## 2016-09-21 DIAGNOSIS — J4521 Mild intermittent asthma with (acute) exacerbation: Secondary | ICD-10-CM | POA: Diagnosis not present

## 2016-10-15 DIAGNOSIS — F5081 Binge eating disorder: Secondary | ICD-10-CM | POA: Diagnosis not present

## 2016-10-15 DIAGNOSIS — F39 Unspecified mood [affective] disorder: Secondary | ICD-10-CM | POA: Diagnosis not present

## 2016-10-15 DIAGNOSIS — F4310 Post-traumatic stress disorder, unspecified: Secondary | ICD-10-CM | POA: Diagnosis not present

## 2016-10-15 DIAGNOSIS — G4700 Insomnia, unspecified: Secondary | ICD-10-CM | POA: Diagnosis not present

## 2016-11-25 DIAGNOSIS — H35371 Puckering of macula, right eye: Secondary | ICD-10-CM | POA: Diagnosis not present

## 2016-11-29 DIAGNOSIS — J983 Compensatory emphysema: Secondary | ICD-10-CM | POA: Diagnosis not present

## 2016-11-29 DIAGNOSIS — E119 Type 2 diabetes mellitus without complications: Secondary | ICD-10-CM | POA: Diagnosis not present

## 2016-11-29 DIAGNOSIS — K739 Chronic hepatitis, unspecified: Secondary | ICD-10-CM | POA: Diagnosis not present

## 2016-11-29 DIAGNOSIS — I10 Essential (primary) hypertension: Secondary | ICD-10-CM | POA: Diagnosis not present

## 2016-12-19 DIAGNOSIS — H26492 Other secondary cataract, left eye: Secondary | ICD-10-CM | POA: Diagnosis not present

## 2017-01-02 DIAGNOSIS — I10 Essential (primary) hypertension: Secondary | ICD-10-CM | POA: Diagnosis not present

## 2017-01-02 DIAGNOSIS — E119 Type 2 diabetes mellitus without complications: Secondary | ICD-10-CM | POA: Diagnosis not present

## 2017-01-02 DIAGNOSIS — R0602 Shortness of breath: Secondary | ICD-10-CM | POA: Diagnosis not present

## 2017-01-02 DIAGNOSIS — K739 Chronic hepatitis, unspecified: Secondary | ICD-10-CM | POA: Diagnosis not present

## 2017-01-07 DIAGNOSIS — F4310 Post-traumatic stress disorder, unspecified: Secondary | ICD-10-CM | POA: Diagnosis not present

## 2017-01-07 DIAGNOSIS — F5081 Binge eating disorder: Secondary | ICD-10-CM | POA: Diagnosis not present

## 2017-01-07 DIAGNOSIS — G4700 Insomnia, unspecified: Secondary | ICD-10-CM | POA: Diagnosis not present

## 2017-01-07 DIAGNOSIS — F39 Unspecified mood [affective] disorder: Secondary | ICD-10-CM | POA: Diagnosis not present

## 2017-01-16 DIAGNOSIS — F331 Major depressive disorder, recurrent, moderate: Secondary | ICD-10-CM | POA: Diagnosis not present

## 2017-01-16 DIAGNOSIS — F4310 Post-traumatic stress disorder, unspecified: Secondary | ICD-10-CM | POA: Diagnosis not present

## 2017-01-22 DIAGNOSIS — K739 Chronic hepatitis, unspecified: Secondary | ICD-10-CM | POA: Diagnosis not present

## 2017-01-22 DIAGNOSIS — R0602 Shortness of breath: Secondary | ICD-10-CM | POA: Diagnosis not present

## 2017-01-22 DIAGNOSIS — R079 Chest pain, unspecified: Secondary | ICD-10-CM | POA: Diagnosis not present

## 2017-01-22 DIAGNOSIS — E119 Type 2 diabetes mellitus without complications: Secondary | ICD-10-CM | POA: Diagnosis not present

## 2017-01-22 DIAGNOSIS — I10 Essential (primary) hypertension: Secondary | ICD-10-CM | POA: Diagnosis not present

## 2017-01-23 DIAGNOSIS — E8881 Metabolic syndrome: Secondary | ICD-10-CM | POA: Diagnosis not present

## 2017-01-23 DIAGNOSIS — R42 Dizziness and giddiness: Secondary | ICD-10-CM | POA: Diagnosis not present

## 2017-01-23 DIAGNOSIS — R079 Chest pain, unspecified: Secondary | ICD-10-CM | POA: Diagnosis not present

## 2017-01-23 DIAGNOSIS — H26492 Other secondary cataract, left eye: Secondary | ICD-10-CM | POA: Diagnosis not present

## 2017-02-10 DIAGNOSIS — F39 Unspecified mood [affective] disorder: Secondary | ICD-10-CM | POA: Diagnosis not present

## 2017-02-10 DIAGNOSIS — Z79899 Other long term (current) drug therapy: Secondary | ICD-10-CM | POA: Diagnosis not present

## 2017-04-09 DIAGNOSIS — F4310 Post-traumatic stress disorder, unspecified: Secondary | ICD-10-CM | POA: Diagnosis not present

## 2017-04-09 DIAGNOSIS — G4700 Insomnia, unspecified: Secondary | ICD-10-CM | POA: Diagnosis not present

## 2017-04-09 DIAGNOSIS — F39 Unspecified mood [affective] disorder: Secondary | ICD-10-CM | POA: Diagnosis not present

## 2017-05-23 ENCOUNTER — Ambulatory Visit: Payer: Self-pay | Admitting: Family

## 2017-05-23 VITALS — BP 165/89 | HR 93 | Temp 98.5°F | Resp 16

## 2017-05-23 DIAGNOSIS — R350 Frequency of micturition: Secondary | ICD-10-CM

## 2017-05-23 LAB — POCT URINALYSIS DIPSTICK
Bilirubin, UA: NEGATIVE
Blood, UA: NEGATIVE
Glucose, UA: NEGATIVE
Leukocytes, UA: NEGATIVE
Nitrite, UA: POSITIVE
Protein, UA: NEGATIVE
Spec Grav, UA: >=1.03 — AB (ref 1.010–1.025)
Urobilinogen, UA: 1 E.U./dL
pH, UA: 5.5 (ref 5.0–8.0)

## 2017-05-23 MED ORDER — FLUCONAZOLE 150 MG PO TABS: 150.0000 mg | ORAL_TABLET | Freq: Once | ORAL | 0 refills | Status: AC

## 2017-05-23 MED ORDER — PHENAZOPYRIDINE HCL 200 MG PO TABS: 200.0000 mg | ORAL_TABLET | Freq: Three times a day (TID) | ORAL | 0 refills | Status: DC | PRN

## 2017-05-23 MED ORDER — NITROFURANTOIN MONOHYD MACRO 100 MG PO CAPS: 100.0000 mg | ORAL_CAPSULE | Freq: Two times a day (BID) | ORAL | 0 refills | Status: DC

## 2017-05-23 NOTE — Progress Notes (Signed)
S / 3-4 d h/o dysuria ,frequency , felt chilled yesterday, has been drinking diet sodas more and had constipation, h/o Recurrant UTIS   O/ alert , appears uncomfortable but not toxic ,BP up , afebrile- has had tylenol Heart rsr lungs clear, abd + sp tenderness   U/a with + nitrites , tr ketones A/ UTI  P /rx macrobid, diflucan, pyridium hydration encouraged.  If sxs not improving urged to seek urgent care.

## 2017-05-26 DIAGNOSIS — E119 Type 2 diabetes mellitus without complications: Secondary | ICD-10-CM | POA: Diagnosis not present

## 2017-05-26 DIAGNOSIS — J983 Compensatory emphysema: Secondary | ICD-10-CM | POA: Diagnosis not present

## 2017-05-26 DIAGNOSIS — K219 Gastro-esophageal reflux disease without esophagitis: Secondary | ICD-10-CM | POA: Diagnosis not present

## 2017-05-26 DIAGNOSIS — J399 Disease of upper respiratory tract, unspecified: Secondary | ICD-10-CM | POA: Diagnosis not present

## 2017-05-27 DIAGNOSIS — E119 Type 2 diabetes mellitus without complications: Secondary | ICD-10-CM | POA: Diagnosis not present

## 2017-06-16 DIAGNOSIS — R109 Unspecified abdominal pain: Secondary | ICD-10-CM | POA: Diagnosis not present

## 2017-06-16 DIAGNOSIS — R399 Unspecified symptoms and signs involving the genitourinary system: Secondary | ICD-10-CM | POA: Diagnosis not present

## 2017-06-16 DIAGNOSIS — Z87442 Personal history of urinary calculi: Secondary | ICD-10-CM | POA: Diagnosis not present

## 2017-07-08 DIAGNOSIS — F39 Unspecified mood [affective] disorder: Secondary | ICD-10-CM | POA: Diagnosis not present

## 2017-07-08 DIAGNOSIS — G4700 Insomnia, unspecified: Secondary | ICD-10-CM | POA: Diagnosis not present

## 2017-07-08 DIAGNOSIS — F4310 Post-traumatic stress disorder, unspecified: Secondary | ICD-10-CM | POA: Diagnosis not present

## 2017-07-08 DIAGNOSIS — F5081 Binge eating disorder: Secondary | ICD-10-CM | POA: Diagnosis not present

## 2017-07-09 DIAGNOSIS — E119 Type 2 diabetes mellitus without complications: Secondary | ICD-10-CM | POA: Diagnosis not present

## 2017-07-09 DIAGNOSIS — H35372 Puckering of macula, left eye: Secondary | ICD-10-CM | POA: Diagnosis not present

## 2017-07-24 DIAGNOSIS — N390 Urinary tract infection, site not specified: Secondary | ICD-10-CM | POA: Diagnosis not present

## 2017-07-24 DIAGNOSIS — N200 Calculus of kidney: Secondary | ICD-10-CM | POA: Diagnosis not present

## 2017-07-24 DIAGNOSIS — N23 Unspecified renal colic: Secondary | ICD-10-CM | POA: Diagnosis not present

## 2017-10-04 DIAGNOSIS — B0229 Other postherpetic nervous system involvement: Secondary | ICD-10-CM | POA: Diagnosis not present

## 2017-10-04 DIAGNOSIS — B0221 Postherpetic geniculate ganglionitis: Secondary | ICD-10-CM | POA: Diagnosis not present

## 2017-10-06 ENCOUNTER — Encounter: Payer: Self-pay | Admitting: Emergency Medicine

## 2017-10-06 ENCOUNTER — Emergency Department
Admission: EM | Admit: 2017-10-06 | Discharge: 2017-10-06 | Disposition: A | Discharge status: Home / Self Care | Payer: 59 | Attending: Emergency Medicine | Admitting: Emergency Medicine

## 2017-10-06 ENCOUNTER — Other Ambulatory Visit: Payer: Self-pay

## 2017-10-06 DIAGNOSIS — R21 Rash and other nonspecific skin eruption: Secondary | ICD-10-CM | POA: Diagnosis not present

## 2017-10-06 DIAGNOSIS — B029 Zoster without complications: Secondary | ICD-10-CM | POA: Insufficient documentation

## 2017-10-06 DIAGNOSIS — E119 Type 2 diabetes mellitus without complications: Secondary | ICD-10-CM | POA: Diagnosis not present

## 2017-10-06 DIAGNOSIS — J029 Acute pharyngitis, unspecified: Secondary | ICD-10-CM | POA: Diagnosis not present

## 2017-10-06 DIAGNOSIS — I10 Essential (primary) hypertension: Secondary | ICD-10-CM | POA: Insufficient documentation

## 2017-10-06 DIAGNOSIS — Z79899 Other long term (current) drug therapy: Secondary | ICD-10-CM | POA: Insufficient documentation

## 2017-10-06 DIAGNOSIS — J45909 Unspecified asthma, uncomplicated: Secondary | ICD-10-CM | POA: Diagnosis not present

## 2017-10-06 DIAGNOSIS — R509 Fever, unspecified: Secondary | ICD-10-CM | POA: Diagnosis not present

## 2017-10-06 HISTORY — DX: Type 2 diabetes mellitus without complications: E11.9

## 2017-10-06 HISTORY — DX: Unspecified asthma, uncomplicated: J45.909

## 2017-10-06 HISTORY — DX: Essential (primary) hypertension: I10

## 2017-10-06 LAB — CBC WITH DIFFERENTIAL/PLATELET
Basophils Absolute: 0 10*3/uL (ref 0–0.1)
Basophils Relative: 0 %
Eosinophils Absolute: 0 10*3/uL (ref 0–0.7)
Eosinophils Relative: 1 %
HCT: 42.7 % (ref 35.0–47.0)
Hemoglobin: 14.4 g/dL (ref 12.0–16.0)
Lymphocytes Relative: 14 %
Lymphs Abs: 0.6 10*3/uL — ABNORMAL LOW (ref 1.0–3.6)
MCH: 29 pg (ref 26.0–34.0)
MCHC: 33.8 g/dL (ref 32.0–36.0)
MCV: 85.8 fL (ref 80.0–100.0)
Monocytes Absolute: 0.7 10*3/uL (ref 0.2–0.9)
Monocytes Relative: 15 %
Neutro Abs: 3.3 10*3/uL (ref 1.4–6.5)
Neutrophils Relative %: 70 %
Platelets: 149 10*3/uL — ABNORMAL LOW (ref 150–440)
RBC: 4.97 MIL/uL (ref 3.80–5.20)
RDW: 12.9 % (ref 11.5–14.5)
WBC: 4.7 10*3/uL (ref 3.6–11.0)

## 2017-10-06 LAB — COMPREHENSIVE METABOLIC PANEL
ALT: 24 U/L (ref 14–54)
AST: 31 U/L (ref 15–41)
Albumin: 3.7 g/dL (ref 3.5–5.0)
Alkaline Phosphatase: 92 U/L (ref 38–126)
Anion gap: 8 (ref 5–15)
BUN: 9 mg/dL (ref 6–20)
CO2: 25 mmol/L (ref 22–32)
Calcium: 8.8 mg/dL — ABNORMAL LOW (ref 8.9–10.3)
Chloride: 100 mmol/L — ABNORMAL LOW (ref 101–111)
Creatinine, Ser: 0.62 mg/dL (ref 0.44–1.00)
GFR calc Af Amer: >60 mL/min (ref >60)
GFR calc non Af Amer: >60 mL/min (ref >60)
Glucose, Bld: 167 mg/dL — ABNORMAL HIGH (ref 65–99)
Potassium: 4.2 mmol/L (ref 3.5–5.1)
Sodium: 133 mmol/L — ABNORMAL LOW (ref 135–145)
Total Bilirubin: 0.6 mg/dL (ref 0.3–1.2)
Total Protein: 7.5 g/dL (ref 6.5–8.1)

## 2017-10-06 MED ORDER — SODIUM CHLORIDE 0.9 % IV BOLUS (SEPSIS)
1000.0000 mL | Freq: Once | INTRAVENOUS | Status: AC
  Administered 2017-10-06: 1000 mL via INTRAVENOUS

## 2017-10-06 MED ORDER — LIDOCAINE VISCOUS 2 % MT SOLN
5.0000 mL | Freq: Once | OROMUCOSAL | Status: AC
  Administered 2017-10-06: 5 mL via OROMUCOSAL
  Filled 2017-10-06: qty 15

## 2017-10-06 MED ORDER — MAGIC MOUTHWASH
5.0000 mL | Freq: Once | ORAL | Status: AC
  Administered 2017-10-06: 5 mL via ORAL
  Filled 2017-10-06: qty 10

## 2017-10-06 MED ORDER — ONDANSETRON HCL 4 MG/2ML IJ SOLN
4.0000 mg | Freq: Once | INTRAMUSCULAR | Status: AC
  Administered 2017-10-06: 4 mg via INTRAVENOUS
  Filled 2017-10-06: qty 2

## 2017-10-06 MED ORDER — LIDOCAINE VISCOUS 2 % MT SOLN: OROMUCOSAL | 0 refills | Status: DC

## 2017-10-06 NOTE — ED Provider Notes (Signed)
[facility] Emergency Department Provider Note  ___________________________________________   First MD Initiated Contact with Patient 10/06/17 [phone]     (approximate)  I have reviewed the triage vital signs and the nursing notes.   HISTORY  Chief Complaint Herpes Zoster   HPI April Singleton is a 67 y.o. female is here with complaint of right-sided  And facial rash with swelling. Patient states that she saw one of the doctors at [facility] and started on antiviral medication 2 days ago. She was diagnosed with herpes zoster. Patient is continue taking medication as directed. She states that her mouth is now extremely sore making it difficult for her to drink fluids. She denies any fever or chills. She continues to take Norco and Valtrex 1000mg   3 times a day.  patient rates her pain as 8 out of 10.   Past Medical History:  Diagnosis Date  . Asthma   . Diabetes mellitus without complication (HCC)   . Hypertension     There are no active problems to display for this patient.   Past Surgical History:  Procedure Laterality Date  . ABDOMINAL HYSTERECTOMY    . BREAST BIOPSY    . TONSILLECTOMY      Prior to Admission medications   Medication Sig Start Date End Date Taking? Authorizing Provider  carbamazepine (TEGRETOL) 200 MG tablet Take 200 mg by mouth 3 (three) times daily.   Yes [provider]  citalopram (CELEXA) 20 MG tablet Take 20 mg by mouth daily.   Yes [provider]  lidocaine (XYLOCAINE) 2 % solution 1-2 tsp swish and swallow as needed for throat pain q 4 hours 10/06/17   Tommi RumpsSummers, Christl Fessenden L, PA-C  nitrofurantoin, macrocrystal-monohydrate, (MACROBID) 100 MG capsule Take 1 capsule (100 mg total) by mouth 2 (two) times daily. 05/23/17   Francesco SorMoore, Tommie Anne, NP  phenazopyridine (PYRIDIUM) 200 MG tablet Take 1 tablet (200 mg total) by mouth 3 (three) times daily as needed for pain. 05/23/17   Francesco SorMoore, Tommie Anne, NP  verapamil  (CALAN-SR) 180 MG CR tablet  04/04/17   [provider]    Allergies Codeine; Penicillins; Prednisone; and Sulfa antibiotics  No family history on file.  Social History Social History   Tobacco Use  . Smoking status: Never Smoker  Substance Use Topics  . Alcohol use: Not on file  . Drug use: Not on file    Review of Systems Constitutional: subjective fever/chills Eyes: No visual changes. ENT: positive sore throat. Cardiovascular: Denies chest pain. Respiratory: Denies shortness of breath. Gastrointestinal: No abdominal pain.  positive nausea, no vomiting.  Skin: positive for rash. Neurological: Negative for headaches, focal weakness or numbness. ____________________________________________   PHYSICAL EXAM:  VITAL SIGNS: ED Triage Vitals  Enc Vitals Group     BP 10/06/17 0744 (!) 179/76     Pulse Rate 10/06/17 0744 95     Resp 10/06/17 0744 18     Temp 10/06/17 0744 98.5 F (36.9 C)     Temp Source 10/06/17 0744 Oral     SpO2 10/06/17 0744 98 %     Weight 10/06/17 0744 220 lb (99.8 kg)     Height 10/06/17 0744 5\' 1"  (1.549 m)     Head Circumference --      Peak Flow --      Pain Score 10/06/17 0752 8     Pain Loc --      Pain Edu? --      Excl. in  GC? --    Constitutional: Alert and oriented. Well appearing and in no acute distress. Eyes: Conjunctivae are normal.  Head: Atraumatic. Nose: No congestion/rhinnorhea. Skin around her nose is erythematous with postnasal present at the nostril. No active drainage. Mouth/Throat: Mucous membranes are moist.  Oropharynx  With right sided erythema and individual vesicles on the soft palate. Uvula is midline.patient is able to talk in complete sentences. Neck: No stridor.   Hematological/Lymphatic/Immunilogical: No cervical lymphadenopathy.  Cardiovascular: Normal rate, regular rhythm. Grossly normal heart sounds.  Good peripheral circulation. Respiratory: Normal respiratory effort.  No retractions. Lungs  CTAB. Gastrointestinal: Soft and nontender. No distention.  Musculoskeletal: moves upper and lower extremities without difficulty. Normal gait and without assistance. Neurologic:  Normal speech and language. No gross focal neurologic deficits are appreciated. No gait instability. Skin:  Skin is warm, dry.  Erythematous macular area on the right side of face including cheek and right side of nose.Vesicles present with pustule just below the nostril to the right.No drainage and no open areas present. Psychiatric: Mood and affect are normal. Speech and behavior are normal.  ____________________________________________   LABS (all labs ordered are listed, but only abnormal results are displayed)  Labs Reviewed  CBC WITH DIFFERENTIAL/PLATELET - Abnormal; Notable for the following components:      Result Value   Platelets 149 (*)    Lymphs Abs 0.6 (*)    All other components within normal limits  COMPREHENSIVE METABOLIC PANEL - Abnormal; Notable for the following components:   Sodium 133 (*)    Chloride 100 (*)    Glucose, Bld 167 (*)    Calcium 8.8 (*)    All other components within normal limits     PROCEDURES  Procedure(s) performed: None  Procedures  Critical Care performed: No  ____________________________________________   INITIAL IMPRESSION / ASSESSMENT AND PLAN / ED COURSE Patient was given normal saline 1 L while in the department and began to improve. Patient was sitting in a chair prior to discharge. Patient will continue taking Valtrex and Norco as directed. Patient has an appointment with her ophthalmologist at [facility] tomorrow. She is given a prescription for viscous lidocaine to swish in her mouth for throat pain to enable her to drink fluids without pain. Patient and husband were told to return to the emergency Department if any worsening of her symptoms. In talking with the husband and patient was recently exposed to her daughter who had shingles and also  a grandchild who has chickenpox. ____________________________________________   FINAL CLINICAL IMPRESSION(S) / ED DIAGNOSES  Final diagnoses:  Herpes zoster without complication     ED Discharge Orders        Ordered    lidocaine (XYLOCAINE) 2 % solution     10/06/17 1505       Note:  This document was prepared using Dragon voice recognition software and may include unintentional dictation errors.    Tommi Rumps, PA-C 10/06/17 1534    Minna Antis, MD 10/07/17 2318

## 2017-10-06 NOTE — ED Notes (Addendum)
See triage note  Developed rash to right side of face several days ago  Was [facility] and placed on Valtrex and given norco for the pain  States pain is worse  States she now has swelling to right side of face and feels likes throat is swollen

## 2017-10-06 NOTE — Discharge Instructions (Signed)
Continue your shingles medication as prescribed by your doctor. Continue pain medication as directed. Keep your appointment with [facility] tomorrow. Return to the emergency department if any severe worsening of her symptoms.

## 2017-10-06 NOTE — ED Triage Notes (Signed)
Here for recheck. Began rash 5 days ago R face. States was seen by MD and started on antiviral [facility]. States painful in mouth. Still taking antiviral.

## 2017-10-06 NOTE — ED Notes (Signed)
Up to bathroom

## 2017-10-09 DIAGNOSIS — G4700 Insomnia, unspecified: Secondary | ICD-10-CM | POA: Diagnosis not present

## 2017-10-09 DIAGNOSIS — H538 Other visual disturbances: Secondary | ICD-10-CM | POA: Diagnosis not present

## 2017-10-09 DIAGNOSIS — F5081 Binge eating disorder: Secondary | ICD-10-CM | POA: Diagnosis not present

## 2017-10-09 DIAGNOSIS — F39 Unspecified mood [affective] disorder: Secondary | ICD-10-CM | POA: Diagnosis not present

## 2017-10-09 DIAGNOSIS — F4310 Post-traumatic stress disorder, unspecified: Secondary | ICD-10-CM | POA: Diagnosis not present

## 2017-10-20 ENCOUNTER — Other Ambulatory Visit: Payer: Self-pay | Admitting: Ophthalmology

## 2017-10-20 DIAGNOSIS — H53451 Other localized visual field defect, right eye: Secondary | ICD-10-CM | POA: Diagnosis not present

## 2017-10-28 ENCOUNTER — Ambulatory Visit
Admission: RE | Admit: 2017-10-28 | Discharge: 2017-10-28 | Disposition: A | Discharge status: Home / Self Care | Payer: 59 | Source: Ambulatory Visit | Attending: Ophthalmology | Admitting: Ophthalmology

## 2017-10-28 DIAGNOSIS — H53451 Other localized visual field defect, right eye: Secondary | ICD-10-CM | POA: Insufficient documentation

## 2017-10-28 DIAGNOSIS — H53452 Other localized visual field defect, left eye: Secondary | ICD-10-CM | POA: Diagnosis not present

## 2017-10-28 MED ORDER — GADOBENATE DIMEGLUMINE 529 MG/ML IV SOLN
20.0000 mL | Freq: Once | INTRAVENOUS | Status: AC | PRN
  Administered 2017-10-28: 20 mL via INTRAVENOUS

## 2017-12-03 DIAGNOSIS — F39 Unspecified mood [affective] disorder: Secondary | ICD-10-CM | POA: Diagnosis not present

## 2017-12-03 DIAGNOSIS — F4310 Post-traumatic stress disorder, unspecified: Secondary | ICD-10-CM | POA: Diagnosis not present

## 2017-12-03 DIAGNOSIS — G4700 Insomnia, unspecified: Secondary | ICD-10-CM | POA: Diagnosis not present

## 2017-12-29 DIAGNOSIS — F39 Unspecified mood [affective] disorder: Secondary | ICD-10-CM | POA: Diagnosis not present

## 2017-12-29 DIAGNOSIS — F4310 Post-traumatic stress disorder, unspecified: Secondary | ICD-10-CM | POA: Diagnosis not present

## 2017-12-29 DIAGNOSIS — G4700 Insomnia, unspecified: Secondary | ICD-10-CM | POA: Diagnosis not present

## 2017-12-29 DIAGNOSIS — F5081 Binge eating disorder: Secondary | ICD-10-CM | POA: Diagnosis not present

## 2018-02-17 DIAGNOSIS — F5081 Binge eating disorder: Secondary | ICD-10-CM | POA: Diagnosis not present

## 2018-02-17 DIAGNOSIS — F4310 Post-traumatic stress disorder, unspecified: Secondary | ICD-10-CM | POA: Diagnosis not present

## 2018-02-17 DIAGNOSIS — F39 Unspecified mood [affective] disorder: Secondary | ICD-10-CM | POA: Diagnosis not present

## 2018-02-17 DIAGNOSIS — G4700 Insomnia, unspecified: Secondary | ICD-10-CM | POA: Diagnosis not present

## 2018-03-02 DIAGNOSIS — K219 Gastro-esophageal reflux disease without esophagitis: Secondary | ICD-10-CM | POA: Diagnosis not present

## 2018-03-02 DIAGNOSIS — W450XXA Nail entering through skin, initial encounter: Secondary | ICD-10-CM | POA: Diagnosis not present

## 2018-03-02 DIAGNOSIS — E119 Type 2 diabetes mellitus without complications: Secondary | ICD-10-CM | POA: Diagnosis not present

## 2018-03-02 DIAGNOSIS — J983 Compensatory emphysema: Secondary | ICD-10-CM | POA: Diagnosis not present

## 2018-03-02 DIAGNOSIS — E669 Obesity, unspecified: Secondary | ICD-10-CM | POA: Diagnosis not present

## 2018-03-12 DIAGNOSIS — Z0000 Encounter for general adult medical examination without abnormal findings: Secondary | ICD-10-CM | POA: Diagnosis not present

## 2018-03-16 ENCOUNTER — Other Ambulatory Visit
Admission: RE | Admit: 2018-03-16 | Discharge: 2018-03-16 | Disposition: A | Discharge status: Home / Self Care | Payer: 59 | Source: Ambulatory Visit | Attending: Internal Medicine | Admitting: Internal Medicine

## 2018-03-16 DIAGNOSIS — E7800 Pure hypercholesterolemia, unspecified: Secondary | ICD-10-CM | POA: Insufficient documentation

## 2018-03-16 LAB — CBC
HCT: 40.2 % (ref 35.0–47.0)
Hemoglobin: 13.7 g/dL (ref 12.0–16.0)
MCH: 29.6 pg (ref 26.0–34.0)
MCHC: 34.2 g/dL (ref 32.0–36.0)
MCV: 86.5 fL (ref 80.0–100.0)
Platelets: 177 10*3/uL (ref 150–440)
RBC: 4.65 MIL/uL (ref 3.80–5.20)
RDW: 12.8 % (ref 11.5–14.5)
WBC: 5.3 10*3/uL (ref 3.6–11.0)

## 2018-03-16 LAB — BASIC METABOLIC PANEL
Anion gap: 12 (ref 5–15)
BUN: 10 mg/dL (ref 6–20)
CO2: 25 mmol/L (ref 22–32)
Calcium: 9.2 mg/dL (ref 8.9–10.3)
Chloride: 100 mmol/L — ABNORMAL LOW (ref 101–111)
Creatinine, Ser: 0.54 mg/dL (ref 0.44–1.00)
GFR calc Af Amer: >60 mL/min (ref >60)
GFR calc non Af Amer: >60 mL/min (ref >60)
Glucose, Bld: 117 mg/dL — ABNORMAL HIGH (ref 65–99)
Potassium: 4 mmol/L (ref 3.5–5.1)
Sodium: 137 mmol/L (ref 135–145)

## 2018-03-16 LAB — LIPID PANEL
Cholesterol: 281 mg/dL — ABNORMAL HIGH (ref 0–200)
HDL: 68 mg/dL (ref >40)
LDL Cholesterol: 136 mg/dL — ABNORMAL HIGH (ref 0–99)
Total CHOL/HDL Ratio: 4.1 RATIO
Triglycerides: 383 mg/dL — ABNORMAL HIGH (ref <150)
VLDL: 77 mg/dL — ABNORMAL HIGH (ref 0–40)

## 2018-03-16 LAB — HEMOGLOBIN A1C
Hgb A1c MFr Bld: 7.1 % — ABNORMAL HIGH (ref 4.8–5.6)
Mean Plasma Glucose: 157.07 mg/dL

## 2018-03-16 LAB — CORTISOL: Cortisol, Plasma: 7.4 ug/dL

## 2018-03-16 LAB — TSH: TSH: 2.682 u[IU]/mL (ref 0.350–4.500)

## 2018-03-17 LAB — MICROALBUMIN / CREATININE URINE RATIO
Creatinine, Urine: 135.2 mg/dL
Microalb Creat Ratio: 4.1 mg/g creat (ref 0.0–30.0)
Microalb, Ur: 5.5 ug/mL — ABNORMAL HIGH

## 2018-03-25 DIAGNOSIS — M25551 Pain in right hip: Secondary | ICD-10-CM | POA: Diagnosis not present

## 2018-03-25 DIAGNOSIS — M25552 Pain in left hip: Secondary | ICD-10-CM | POA: Diagnosis not present

## 2018-03-25 DIAGNOSIS — R5382 Chronic fatigue, unspecified: Secondary | ICD-10-CM | POA: Diagnosis not present

## 2018-03-25 DIAGNOSIS — M19042 Primary osteoarthritis, left hand: Secondary | ICD-10-CM | POA: Diagnosis not present

## 2018-03-25 DIAGNOSIS — R7989 Other specified abnormal findings of blood chemistry: Secondary | ICD-10-CM | POA: Diagnosis not present

## 2018-03-25 DIAGNOSIS — M2550 Pain in unspecified joint: Secondary | ICD-10-CM | POA: Diagnosis not present

## 2018-03-25 DIAGNOSIS — M19041 Primary osteoarthritis, right hand: Secondary | ICD-10-CM | POA: Diagnosis not present

## 2018-05-18 DIAGNOSIS — G4700 Insomnia, unspecified: Secondary | ICD-10-CM | POA: Diagnosis not present

## 2018-05-18 DIAGNOSIS — F39 Unspecified mood [affective] disorder: Secondary | ICD-10-CM | POA: Diagnosis not present

## 2018-05-18 DIAGNOSIS — F4310 Post-traumatic stress disorder, unspecified: Secondary | ICD-10-CM | POA: Diagnosis not present

## 2018-05-22 ENCOUNTER — Other Ambulatory Visit
Admission: RE | Admit: 2018-05-22 | Discharge: 2018-05-22 | Disposition: A | Discharge status: Home / Self Care | Payer: 59 | Source: Ambulatory Visit | Attending: Psychiatry | Admitting: Psychiatry

## 2018-05-22 DIAGNOSIS — D510 Vitamin B12 deficiency anemia due to intrinsic factor deficiency: Secondary | ICD-10-CM | POA: Insufficient documentation

## 2018-05-22 DIAGNOSIS — Z79899 Other long term (current) drug therapy: Secondary | ICD-10-CM | POA: Insufficient documentation

## 2018-05-22 LAB — VITAMIN B12: Vitamin B-12: 676 pg/mL (ref 180–914)

## 2018-05-22 LAB — FOLATE: Folate: 15.2 ng/mL (ref >5.9)

## 2018-05-22 LAB — HEMOGLOBIN A1C
Hgb A1c MFr Bld: 7.4 % — ABNORMAL HIGH (ref 4.8–5.6)
Mean Plasma Glucose: 165.68 mg/dL

## 2018-06-10 ENCOUNTER — Other Ambulatory Visit: Payer: Self-pay | Admitting: Internal Medicine

## 2018-06-10 ENCOUNTER — Ambulatory Visit
Admission: RE | Admit: 2018-06-10 | Discharge: 2018-06-10 | Disposition: A | Discharge status: Home / Self Care | Payer: 59 | Source: Ambulatory Visit | Attending: Internal Medicine | Admitting: Internal Medicine

## 2018-06-10 DIAGNOSIS — Z1231 Encounter for screening mammogram for malignant neoplasm of breast: Secondary | ICD-10-CM | POA: Diagnosis not present

## 2018-07-02 DIAGNOSIS — M19041 Primary osteoarthritis, right hand: Secondary | ICD-10-CM | POA: Diagnosis not present

## 2018-07-02 DIAGNOSIS — R7989 Other specified abnormal findings of blood chemistry: Secondary | ICD-10-CM | POA: Diagnosis not present

## 2018-07-02 DIAGNOSIS — R5382 Chronic fatigue, unspecified: Secondary | ICD-10-CM | POA: Diagnosis not present

## 2018-07-08 ENCOUNTER — Other Ambulatory Visit: Payer: Self-pay | Admitting: Gastroenterology

## 2018-07-08 DIAGNOSIS — R1011 Right upper quadrant pain: Secondary | ICD-10-CM | POA: Diagnosis not present

## 2018-07-08 DIAGNOSIS — R1031 Right lower quadrant pain: Secondary | ICD-10-CM | POA: Diagnosis not present

## 2018-07-08 DIAGNOSIS — R197 Diarrhea, unspecified: Secondary | ICD-10-CM | POA: Diagnosis not present

## 2018-07-10 ENCOUNTER — Other Ambulatory Visit
Admission: RE | Admit: 2018-07-10 | Discharge: 2018-07-10 | Disposition: A | Discharge status: Home / Self Care | Payer: 59 | Source: Ambulatory Visit | Attending: Gastroenterology | Admitting: Gastroenterology

## 2018-07-10 DIAGNOSIS — R197 Diarrhea, unspecified: Secondary | ICD-10-CM | POA: Insufficient documentation

## 2018-07-10 LAB — GASTROINTESTINAL PANEL BY PCR, STOOL (REPLACES STOOL CULTURE)

## 2018-07-10 LAB — C DIFFICILE QUICK SCREEN W PCR REFLEX
C Diff antigen: NEGATIVE
C Diff interpretation: NOT DETECTED
C Diff toxin: NEGATIVE

## 2018-07-13 ENCOUNTER — Ambulatory Visit: Payer: 59

## 2018-07-15 DIAGNOSIS — G4700 Insomnia, unspecified: Secondary | ICD-10-CM | POA: Diagnosis not present

## 2018-07-15 DIAGNOSIS — F5081 Binge eating disorder: Secondary | ICD-10-CM | POA: Diagnosis not present

## 2018-07-15 DIAGNOSIS — F39 Unspecified mood [affective] disorder: Secondary | ICD-10-CM | POA: Diagnosis not present

## 2018-07-15 DIAGNOSIS — F4310 Post-traumatic stress disorder, unspecified: Secondary | ICD-10-CM | POA: Diagnosis not present

## 2018-07-17 ENCOUNTER — Ambulatory Visit
Admission: RE | Admit: 2018-07-17 | Discharge: 2018-07-17 | Disposition: A | Discharge status: Home / Self Care | Payer: 59 | Source: Ambulatory Visit | Attending: Gastroenterology | Admitting: Gastroenterology

## 2018-07-17 DIAGNOSIS — Z9884 Bariatric surgery status: Secondary | ICD-10-CM | POA: Diagnosis not present

## 2018-07-17 DIAGNOSIS — K5730 Diverticulosis of large intestine without perforation or abscess without bleeding: Secondary | ICD-10-CM | POA: Insufficient documentation

## 2018-07-17 DIAGNOSIS — R197 Diarrhea, unspecified: Secondary | ICD-10-CM | POA: Diagnosis not present

## 2018-07-17 DIAGNOSIS — R1011 Right upper quadrant pain: Secondary | ICD-10-CM | POA: Insufficient documentation

## 2018-07-17 DIAGNOSIS — R1031 Right lower quadrant pain: Secondary | ICD-10-CM | POA: Insufficient documentation

## 2018-07-17 DIAGNOSIS — N200 Calculus of kidney: Secondary | ICD-10-CM | POA: Insufficient documentation

## 2018-07-17 DIAGNOSIS — K7460 Unspecified cirrhosis of liver: Secondary | ICD-10-CM | POA: Diagnosis not present

## 2018-07-17 DIAGNOSIS — K449 Diaphragmatic hernia without obstruction or gangrene: Secondary | ICD-10-CM | POA: Diagnosis not present

## 2018-07-17 LAB — POCT I-STAT CREATININE: Creatinine, Ser: 0.5 mg/dL (ref 0.44–1.00)

## 2018-07-17 MED ORDER — IOHEXOL 300 MG/ML  SOLN
100.0000 mL | Freq: Once | INTRAMUSCULAR | Status: AC | PRN
  Administered 2018-07-17: 100 mL via INTRAVENOUS

## 2018-08-19 DIAGNOSIS — R197 Diarrhea, unspecified: Secondary | ICD-10-CM | POA: Diagnosis not present

## 2018-08-19 DIAGNOSIS — Z538 Procedure and treatment not carried out for other reasons: Secondary | ICD-10-CM | POA: Diagnosis not present

## 2018-08-19 DIAGNOSIS — K635 Polyp of colon: Secondary | ICD-10-CM | POA: Diagnosis not present

## 2018-08-19 DIAGNOSIS — K5730 Diverticulosis of large intestine without perforation or abscess without bleeding: Secondary | ICD-10-CM | POA: Diagnosis not present

## 2018-08-19 DIAGNOSIS — R1012 Left upper quadrant pain: Secondary | ICD-10-CM | POA: Diagnosis not present

## 2018-08-19 DIAGNOSIS — R1011 Right upper quadrant pain: Secondary | ICD-10-CM | POA: Diagnosis not present

## 2018-08-19 DIAGNOSIS — E669 Obesity, unspecified: Secondary | ICD-10-CM | POA: Diagnosis not present

## 2018-08-19 DIAGNOSIS — K641 Second degree hemorrhoids: Secondary | ICD-10-CM | POA: Diagnosis not present

## 2018-08-19 DIAGNOSIS — K219 Gastro-esophageal reflux disease without esophagitis: Secondary | ICD-10-CM | POA: Diagnosis not present

## 2018-08-19 DIAGNOSIS — K6289 Other specified diseases of anus and rectum: Secondary | ICD-10-CM | POA: Diagnosis not present

## 2018-08-19 DIAGNOSIS — K621 Rectal polyp: Secondary | ICD-10-CM | POA: Diagnosis not present

## 2018-09-09 DIAGNOSIS — G8929 Other chronic pain: Secondary | ICD-10-CM | POA: Diagnosis not present

## 2018-10-14 DIAGNOSIS — E119 Type 2 diabetes mellitus without complications: Secondary | ICD-10-CM | POA: Diagnosis not present

## 2018-10-14 DIAGNOSIS — R5381 Other malaise: Secondary | ICD-10-CM | POA: Diagnosis not present

## 2018-10-29 DIAGNOSIS — G4700 Insomnia, unspecified: Secondary | ICD-10-CM | POA: Diagnosis not present

## 2018-10-29 DIAGNOSIS — F39 Unspecified mood [affective] disorder: Secondary | ICD-10-CM | POA: Diagnosis not present

## 2018-10-29 DIAGNOSIS — F4310 Post-traumatic stress disorder, unspecified: Secondary | ICD-10-CM | POA: Diagnosis not present

## 2018-10-29 DIAGNOSIS — F5081 Binge eating disorder: Secondary | ICD-10-CM | POA: Diagnosis not present

## 2018-11-18 ENCOUNTER — Other Ambulatory Visit: Payer: Self-pay | Admitting: Gastroenterology

## 2018-11-18 DIAGNOSIS — F5081 Binge eating disorder: Secondary | ICD-10-CM | POA: Diagnosis not present

## 2018-11-18 DIAGNOSIS — F4310 Post-traumatic stress disorder, unspecified: Secondary | ICD-10-CM | POA: Diagnosis not present

## 2018-11-18 DIAGNOSIS — F39 Unspecified mood [affective] disorder: Secondary | ICD-10-CM | POA: Diagnosis not present

## 2018-11-18 DIAGNOSIS — G4700 Insomnia, unspecified: Secondary | ICD-10-CM | POA: Diagnosis not present

## 2018-11-18 DIAGNOSIS — K7460 Unspecified cirrhosis of liver: Secondary | ICD-10-CM

## 2018-12-06 DIAGNOSIS — M1711 Unilateral primary osteoarthritis, right knee: Secondary | ICD-10-CM | POA: Diagnosis not present

## 2018-12-06 DIAGNOSIS — M25561 Pain in right knee: Secondary | ICD-10-CM | POA: Diagnosis not present

## 2018-12-06 DIAGNOSIS — R8290 Unspecified abnormal findings in urine: Secondary | ICD-10-CM | POA: Diagnosis not present

## 2018-12-06 DIAGNOSIS — B373 Candidiasis of vulva and vagina: Secondary | ICD-10-CM | POA: Diagnosis not present

## 2018-12-06 DIAGNOSIS — R350 Frequency of micturition: Secondary | ICD-10-CM | POA: Diagnosis not present

## 2019-02-18 DIAGNOSIS — R5382 Chronic fatigue, unspecified: Secondary | ICD-10-CM | POA: Diagnosis not present

## 2019-02-18 DIAGNOSIS — M2550 Pain in unspecified joint: Secondary | ICD-10-CM | POA: Diagnosis not present

## 2019-02-18 DIAGNOSIS — R7989 Other specified abnormal findings of blood chemistry: Secondary | ICD-10-CM | POA: Diagnosis not present

## 2019-02-23 DIAGNOSIS — M2550 Pain in unspecified joint: Secondary | ICD-10-CM | POA: Diagnosis not present

## 2019-02-23 DIAGNOSIS — M19041 Primary osteoarthritis, right hand: Secondary | ICD-10-CM | POA: Diagnosis not present

## 2019-02-23 DIAGNOSIS — G8929 Other chronic pain: Secondary | ICD-10-CM | POA: Diagnosis not present

## 2019-02-25 DIAGNOSIS — M25561 Pain in right knee: Secondary | ICD-10-CM | POA: Diagnosis not present

## 2019-02-25 DIAGNOSIS — G8929 Other chronic pain: Secondary | ICD-10-CM | POA: Diagnosis not present

## 2019-04-12 DIAGNOSIS — F4310 Post-traumatic stress disorder, unspecified: Secondary | ICD-10-CM | POA: Diagnosis not present

## 2019-04-12 DIAGNOSIS — F39 Unspecified mood [affective] disorder: Secondary | ICD-10-CM | POA: Diagnosis not present

## 2019-04-12 DIAGNOSIS — G4700 Insomnia, unspecified: Secondary | ICD-10-CM | POA: Diagnosis not present

## 2019-04-12 DIAGNOSIS — F5081 Binge eating disorder: Secondary | ICD-10-CM | POA: Diagnosis not present

## 2019-04-26 DIAGNOSIS — Z03818 Encounter for observation for suspected exposure to other biological agents ruled out: Secondary | ICD-10-CM | POA: Diagnosis not present

## 2019-04-26 DIAGNOSIS — R1011 Right upper quadrant pain: Secondary | ICD-10-CM | POA: Diagnosis not present

## 2019-04-26 DIAGNOSIS — R399 Unspecified symptoms and signs involving the genitourinary system: Secondary | ICD-10-CM | POA: Diagnosis not present

## 2019-04-26 DIAGNOSIS — J0140 Acute pansinusitis, unspecified: Secondary | ICD-10-CM | POA: Diagnosis not present

## 2019-05-04 ENCOUNTER — Ambulatory Visit
Admission: RE | Admit: 2019-05-04 | Discharge: 2019-05-04 | Disposition: A | Discharge status: Home / Self Care | Payer: 59 | Source: Ambulatory Visit | Attending: Gastroenterology | Admitting: Gastroenterology

## 2019-05-04 ENCOUNTER — Other Ambulatory Visit: Payer: Self-pay

## 2019-05-04 DIAGNOSIS — K7460 Unspecified cirrhosis of liver: Secondary | ICD-10-CM | POA: Insufficient documentation

## 2019-05-06 DIAGNOSIS — K7460 Unspecified cirrhosis of liver: Secondary | ICD-10-CM | POA: Diagnosis not present

## 2019-05-10 ENCOUNTER — Other Ambulatory Visit: Payer: Self-pay | Admitting: Gastroenterology

## 2019-05-10 DIAGNOSIS — R935 Abnormal findings on diagnostic imaging of other abdominal regions, including retroperitoneum: Secondary | ICD-10-CM

## 2019-05-10 DIAGNOSIS — K7460 Unspecified cirrhosis of liver: Secondary | ICD-10-CM

## 2019-05-10 DIAGNOSIS — K769 Liver disease, unspecified: Secondary | ICD-10-CM

## 2019-05-11 DIAGNOSIS — F5081 Binge eating disorder: Secondary | ICD-10-CM | POA: Diagnosis not present

## 2019-05-11 DIAGNOSIS — F4310 Post-traumatic stress disorder, unspecified: Secondary | ICD-10-CM | POA: Diagnosis not present

## 2019-05-11 DIAGNOSIS — G4700 Insomnia, unspecified: Secondary | ICD-10-CM | POA: Diagnosis not present

## 2019-05-11 DIAGNOSIS — F39 Unspecified mood [affective] disorder: Secondary | ICD-10-CM | POA: Diagnosis not present

## 2019-05-14 ENCOUNTER — Other Ambulatory Visit: Payer: Self-pay

## 2019-05-14 ENCOUNTER — Ambulatory Visit
Admission: RE | Admit: 2019-05-14 | Discharge: 2019-05-14 | Disposition: A | Discharge status: Home / Self Care | Payer: 59 | Source: Ambulatory Visit | Attending: Gastroenterology | Admitting: Gastroenterology

## 2019-05-14 DIAGNOSIS — K7460 Unspecified cirrhosis of liver: Secondary | ICD-10-CM | POA: Insufficient documentation

## 2019-05-14 DIAGNOSIS — K769 Liver disease, unspecified: Secondary | ICD-10-CM | POA: Insufficient documentation

## 2019-05-14 DIAGNOSIS — R935 Abnormal findings on diagnostic imaging of other abdominal regions, including retroperitoneum: Secondary | ICD-10-CM | POA: Diagnosis not present

## 2019-05-14 DIAGNOSIS — R112 Nausea with vomiting, unspecified: Secondary | ICD-10-CM | POA: Diagnosis not present

## 2019-05-14 MED ORDER — GADOBUTROL 1 MMOL/ML IV SOLN
9.0000 mL | Freq: Once | INTRAVENOUS | Status: AC | PRN
  Administered 2019-05-14: 9 mL via INTRAVENOUS

## 2019-06-11 DIAGNOSIS — F5081 Binge eating disorder: Secondary | ICD-10-CM | POA: Diagnosis not present

## 2019-06-11 DIAGNOSIS — F4310 Post-traumatic stress disorder, unspecified: Secondary | ICD-10-CM | POA: Diagnosis not present

## 2019-06-11 DIAGNOSIS — F39 Unspecified mood [affective] disorder: Secondary | ICD-10-CM | POA: Diagnosis not present

## 2019-06-11 DIAGNOSIS — G4700 Insomnia, unspecified: Secondary | ICD-10-CM | POA: Diagnosis not present

## 2019-06-24 DIAGNOSIS — F332 Major depressive disorder, recurrent severe without psychotic features: Secondary | ICD-10-CM | POA: Diagnosis not present

## 2019-07-13 DIAGNOSIS — F332 Major depressive disorder, recurrent severe without psychotic features: Secondary | ICD-10-CM | POA: Diagnosis not present

## 2019-07-19 DIAGNOSIS — F332 Major depressive disorder, recurrent severe without psychotic features: Secondary | ICD-10-CM | POA: Diagnosis not present

## 2019-07-20 DIAGNOSIS — F332 Major depressive disorder, recurrent severe without psychotic features: Secondary | ICD-10-CM | POA: Diagnosis not present

## 2019-07-21 DIAGNOSIS — F332 Major depressive disorder, recurrent severe without psychotic features: Secondary | ICD-10-CM | POA: Diagnosis not present

## 2019-07-22 DIAGNOSIS — F332 Major depressive disorder, recurrent severe without psychotic features: Secondary | ICD-10-CM | POA: Diagnosis not present

## 2019-07-23 DIAGNOSIS — F332 Major depressive disorder, recurrent severe without psychotic features: Secondary | ICD-10-CM | POA: Diagnosis not present

## 2019-07-27 DIAGNOSIS — F332 Major depressive disorder, recurrent severe without psychotic features: Secondary | ICD-10-CM | POA: Diagnosis not present

## 2019-07-28 DIAGNOSIS — F332 Major depressive disorder, recurrent severe without psychotic features: Secondary | ICD-10-CM | POA: Diagnosis not present

## 2019-07-29 DIAGNOSIS — F332 Major depressive disorder, recurrent severe without psychotic features: Secondary | ICD-10-CM | POA: Diagnosis not present

## 2019-08-02 DIAGNOSIS — F332 Major depressive disorder, recurrent severe without psychotic features: Secondary | ICD-10-CM | POA: Diagnosis not present

## 2019-08-03 DIAGNOSIS — F332 Major depressive disorder, recurrent severe without psychotic features: Secondary | ICD-10-CM | POA: Diagnosis not present

## 2019-08-04 DIAGNOSIS — F332 Major depressive disorder, recurrent severe without psychotic features: Secondary | ICD-10-CM | POA: Diagnosis not present

## 2019-08-05 DIAGNOSIS — F332 Major depressive disorder, recurrent severe without psychotic features: Secondary | ICD-10-CM | POA: Diagnosis not present

## 2019-08-06 DIAGNOSIS — F332 Major depressive disorder, recurrent severe without psychotic features: Secondary | ICD-10-CM | POA: Diagnosis not present

## 2019-08-09 DIAGNOSIS — F332 Major depressive disorder, recurrent severe without psychotic features: Secondary | ICD-10-CM | POA: Diagnosis not present

## 2019-08-10 DIAGNOSIS — I10 Essential (primary) hypertension: Secondary | ICD-10-CM | POA: Diagnosis not present

## 2019-08-10 DIAGNOSIS — F410 Panic disorder [episodic paroxysmal anxiety] without agoraphobia: Secondary | ICD-10-CM | POA: Diagnosis not present

## 2019-08-10 DIAGNOSIS — Z8659 Personal history of other mental and behavioral disorders: Secondary | ICD-10-CM | POA: Diagnosis not present

## 2019-08-10 DIAGNOSIS — F332 Major depressive disorder, recurrent severe without psychotic features: Secondary | ICD-10-CM | POA: Diagnosis not present

## 2019-08-10 DIAGNOSIS — F419 Anxiety disorder, unspecified: Secondary | ICD-10-CM | POA: Diagnosis not present

## 2019-08-17 DIAGNOSIS — F332 Major depressive disorder, recurrent severe without psychotic features: Secondary | ICD-10-CM | POA: Diagnosis not present

## 2019-08-18 DIAGNOSIS — F332 Major depressive disorder, recurrent severe without psychotic features: Secondary | ICD-10-CM | POA: Diagnosis not present

## 2019-08-19 DIAGNOSIS — F332 Major depressive disorder, recurrent severe without psychotic features: Secondary | ICD-10-CM | POA: Diagnosis not present

## 2019-08-20 DIAGNOSIS — F332 Major depressive disorder, recurrent severe without psychotic features: Secondary | ICD-10-CM | POA: Diagnosis not present

## 2019-08-23 DIAGNOSIS — F332 Major depressive disorder, recurrent severe without psychotic features: Secondary | ICD-10-CM | POA: Diagnosis not present

## 2019-08-24 DIAGNOSIS — F332 Major depressive disorder, recurrent severe without psychotic features: Secondary | ICD-10-CM | POA: Diagnosis not present

## 2019-08-25 DIAGNOSIS — F332 Major depressive disorder, recurrent severe without psychotic features: Secondary | ICD-10-CM | POA: Diagnosis not present

## 2019-08-27 DIAGNOSIS — F332 Major depressive disorder, recurrent severe without psychotic features: Secondary | ICD-10-CM | POA: Diagnosis not present

## 2019-08-30 DIAGNOSIS — F332 Major depressive disorder, recurrent severe without psychotic features: Secondary | ICD-10-CM | POA: Diagnosis not present

## 2019-08-31 DIAGNOSIS — F332 Major depressive disorder, recurrent severe without psychotic features: Secondary | ICD-10-CM | POA: Diagnosis not present

## 2019-09-01 DIAGNOSIS — F332 Major depressive disorder, recurrent severe without psychotic features: Secondary | ICD-10-CM | POA: Diagnosis not present

## 2019-09-02 DIAGNOSIS — F332 Major depressive disorder, recurrent severe without psychotic features: Secondary | ICD-10-CM | POA: Diagnosis not present

## 2019-09-02 IMAGING — CT CT ABD-PELV W/ CM
2 of 5 series · 17 of 46 positions shown, 19 images · IV contrast (APPLIED)
Comparison: None.

CLINICAL DATA: Worsening right lower quadrant and right flank pain
for 4 weeks. Diarrhea. Previous sleeve gastrectomy and hysterectomy.

EXAM:
CT ABDOMEN AND PELVIS WITH CONTRAST
TECHNIQUE: Multidetector CT imaging of the abdomen and pelvis was performed
using the standard protocol following bolus administration of
intravenous contrast.
CONTRAST:  100mL OMNIPAQUE IOHEXOL 300 MG/ML  SOLN

[Series 2: routine abd/pel with · axial · 0.89mm/px · z∈[-426,-26]mm · 14 of 90 slices shown, 16 images]
[im 5/90  soft-tissue]
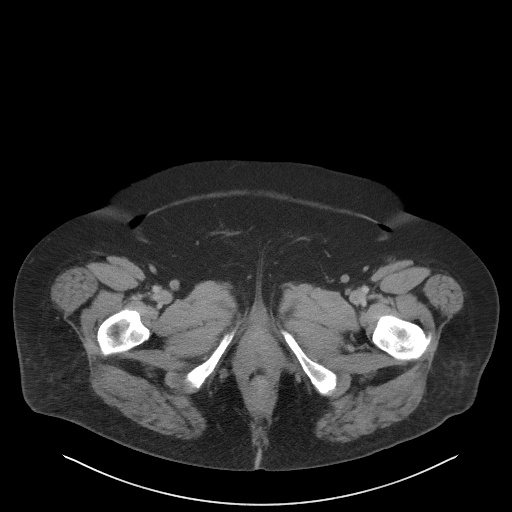
[im 5/90  bone]
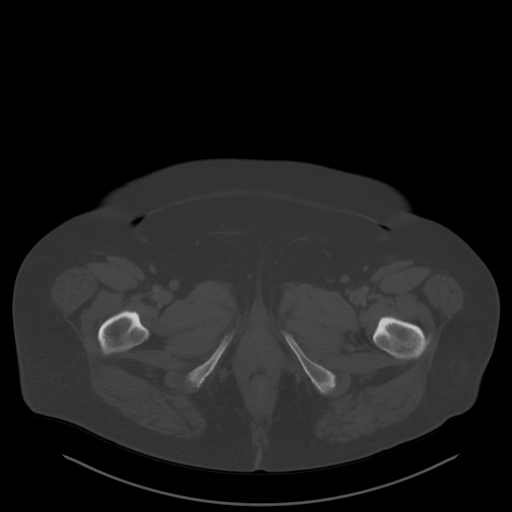
[im 10/90  soft-tissue]
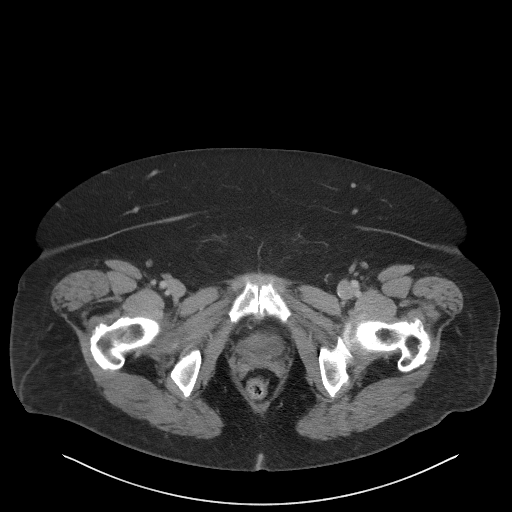
[im 20/90  soft-tissue]
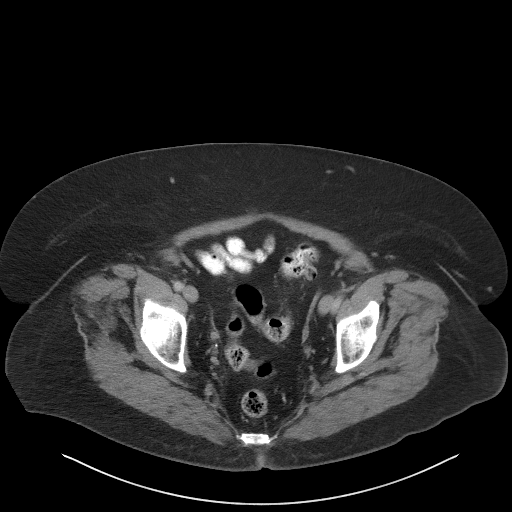
[im 25/90  soft-tissue]
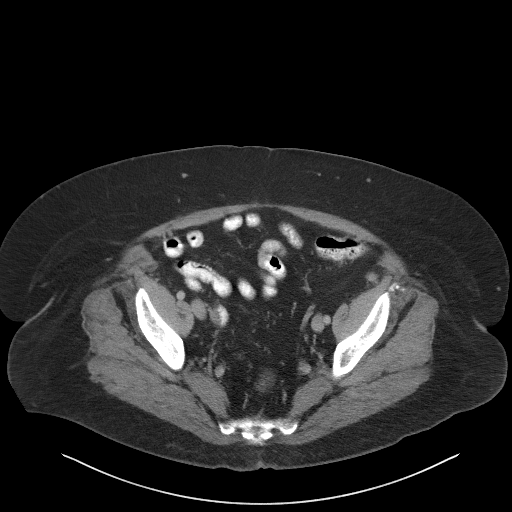
[im 30/90  soft-tissue]
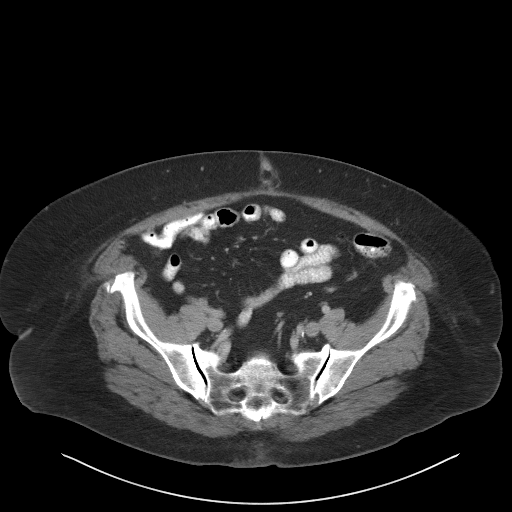
[im 35/90  soft-tissue]
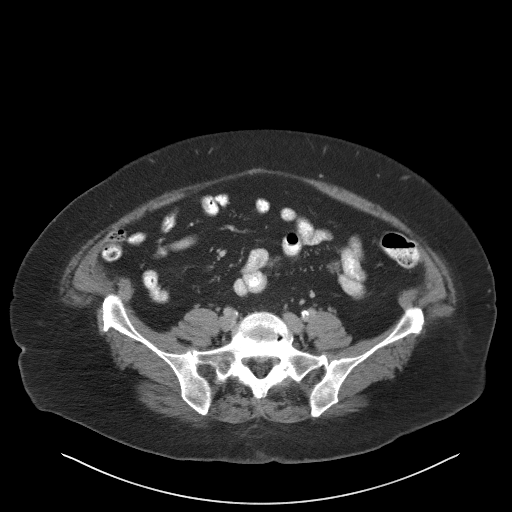
[im 40/90  soft-tissue]
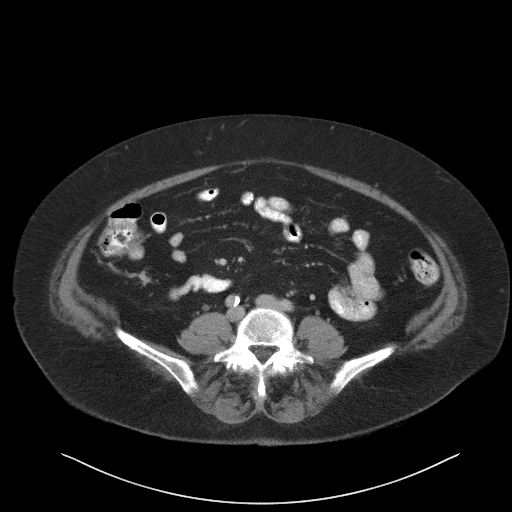
[im 50/90  soft-tissue]
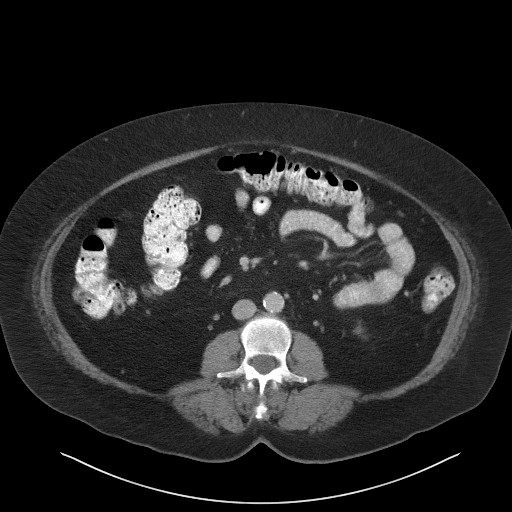
[im 55/90  soft-tissue]
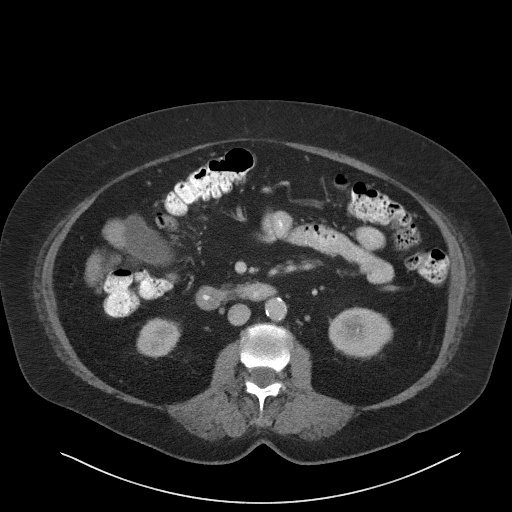
[im 55/90  bone]
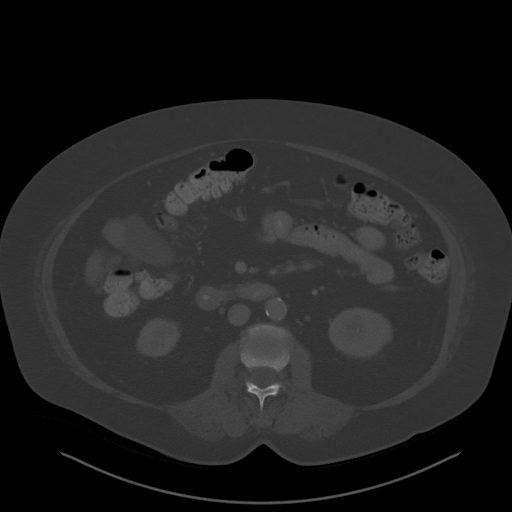
[im 60/90  soft-tissue]
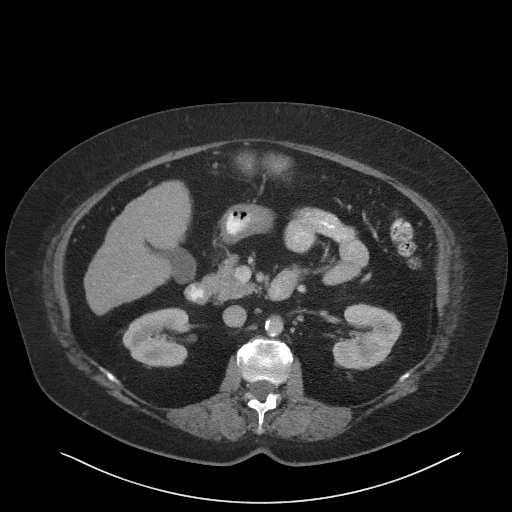
[im 65/90  soft-tissue]
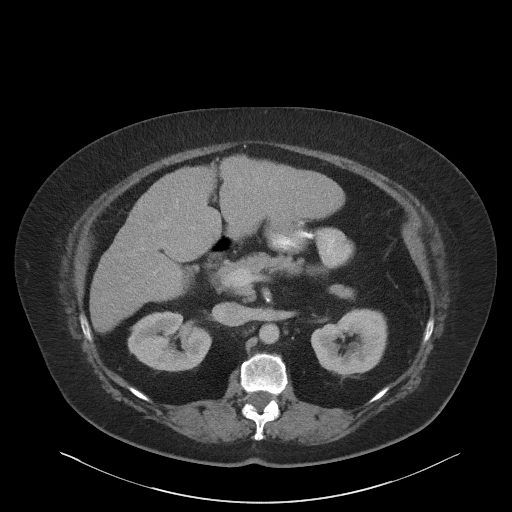
[im 70/90  soft-tissue]
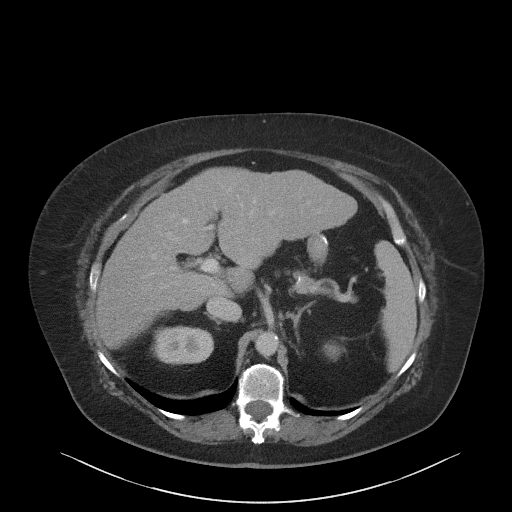
[im 80/90  soft-tissue]
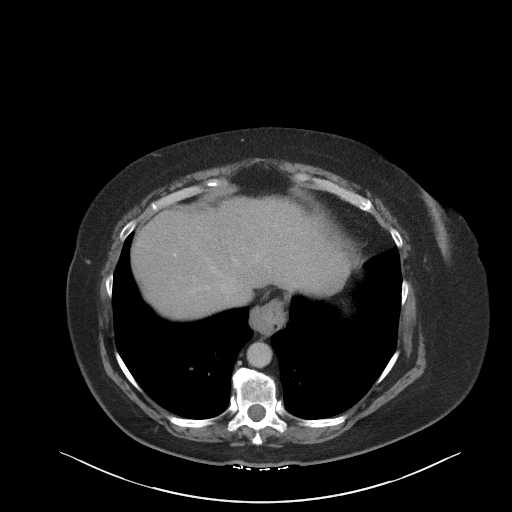
[im 85/90  soft-tissue]
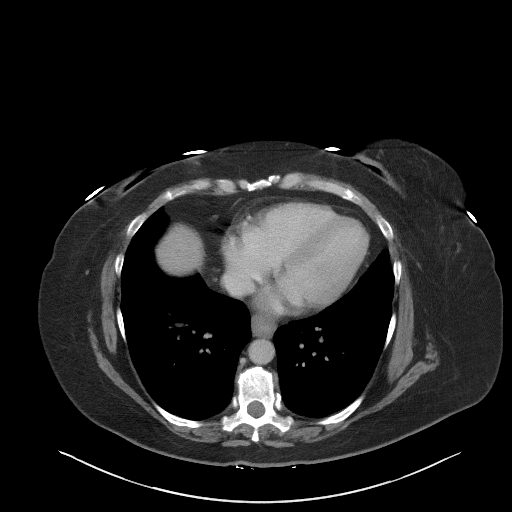

[Series 6: coronal st · coronal · 0.88mm/px · 3 of 112 slices shown]
[im 38/112  soft-tissue]
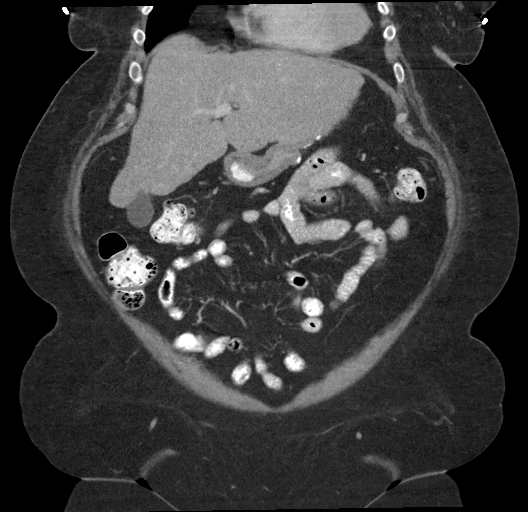
[im 50/112  soft-tissue]
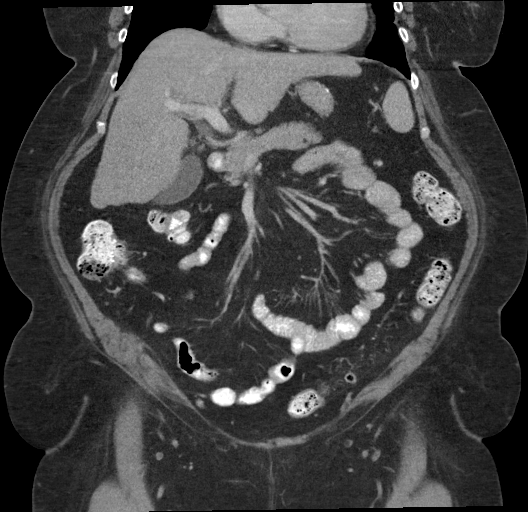
[im 62/112  soft-tissue]
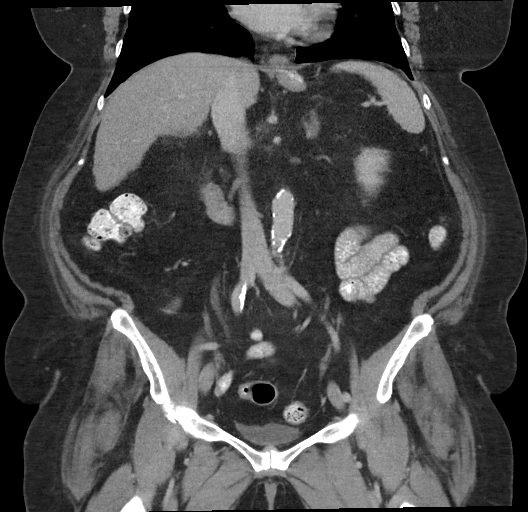

[17 of 46 positions shown; findings below may reference images not displayed]

FINDINGS: Lower Chest: No acute findings.

Hepatobiliary: Hepatic cirrhosis is demonstrated. No hepatic masses
identified. Portal veins are patent. Gallbladder is unremarkable.

Pancreas:  No mass or inflammatory changes.

Spleen: Within normal limits in size and appearance.

Adrenals/Urinary Tract: No masses identified. A few punctate renal
calculi are noted bilaterally. No evidence of ureteral calculi or
hydronephrosis. Unremarkable unopacified urinary bladder.

Stomach/Bowel: Previous sleeve gastrectomy noted. Small hiatal
hernia is seen. No evidence of obstruction, inflammatory process or
abnormal fluid collections. Mild diverticulosis is seen mainly
involving the descending and sigmoid colon, however there is no
evidence of diverticulitis. Normal appendix visualized.

Vascular/Lymphatic: No pathologically enlarged lymph nodes. No
abdominal aortic aneurysm. Aortic atherosclerosis.

Reproductive: Prior hysterectomy noted. Adnexal regions are
unremarkable in appearance.

Other:  No evidence of ascites.

Musculoskeletal:  No suspicious bone lesions identified.
IMPRESSION: No evidence of appendicitis or other acute findings.

Punctate bilateral renal calculi. No evidence of ureteral calculi or
hydronephrosis.

Small hiatal hernia.  Previous sleeve gastrectomy.

Colonic diverticulosis, without radiographic evidence of
diverticulitis.

Hepatic cirrhosis. No evidence of hepatic neoplasm or other
complication.

## 2019-09-03 DIAGNOSIS — F332 Major depressive disorder, recurrent severe without psychotic features: Secondary | ICD-10-CM | POA: Diagnosis not present

## 2019-09-06 DIAGNOSIS — F332 Major depressive disorder, recurrent severe without psychotic features: Secondary | ICD-10-CM | POA: Diagnosis not present

## 2019-09-07 DIAGNOSIS — F332 Major depressive disorder, recurrent severe without psychotic features: Secondary | ICD-10-CM | POA: Diagnosis not present

## 2019-09-09 DIAGNOSIS — F332 Major depressive disorder, recurrent severe without psychotic features: Secondary | ICD-10-CM | POA: Diagnosis not present

## 2019-09-13 DIAGNOSIS — F332 Major depressive disorder, recurrent severe without psychotic features: Secondary | ICD-10-CM | POA: Diagnosis not present

## 2019-09-21 DIAGNOSIS — F332 Major depressive disorder, recurrent severe without psychotic features: Secondary | ICD-10-CM | POA: Diagnosis not present

## 2019-09-23 DIAGNOSIS — F332 Major depressive disorder, recurrent severe without psychotic features: Secondary | ICD-10-CM | POA: Diagnosis not present

## 2019-09-27 DIAGNOSIS — F332 Major depressive disorder, recurrent severe without psychotic features: Secondary | ICD-10-CM | POA: Diagnosis not present

## 2019-09-29 DIAGNOSIS — G4700 Insomnia, unspecified: Secondary | ICD-10-CM | POA: Diagnosis not present

## 2019-09-29 DIAGNOSIS — F5081 Binge eating disorder: Secondary | ICD-10-CM | POA: Diagnosis not present

## 2019-09-29 DIAGNOSIS — F4310 Post-traumatic stress disorder, unspecified: Secondary | ICD-10-CM | POA: Diagnosis not present

## 2019-09-29 DIAGNOSIS — F39 Unspecified mood [affective] disorder: Secondary | ICD-10-CM | POA: Diagnosis not present

## 2019-11-25 DIAGNOSIS — G4700 Insomnia, unspecified: Secondary | ICD-10-CM | POA: Diagnosis not present

## 2019-11-25 DIAGNOSIS — F39 Unspecified mood [affective] disorder: Secondary | ICD-10-CM | POA: Diagnosis not present

## 2019-11-25 DIAGNOSIS — F5081 Binge eating disorder: Secondary | ICD-10-CM | POA: Diagnosis not present

## 2019-11-25 DIAGNOSIS — F4310 Post-traumatic stress disorder, unspecified: Secondary | ICD-10-CM | POA: Diagnosis not present

## 2019-12-08 DIAGNOSIS — F39 Unspecified mood [affective] disorder: Secondary | ICD-10-CM | POA: Diagnosis not present

## 2019-12-08 DIAGNOSIS — F4310 Post-traumatic stress disorder, unspecified: Secondary | ICD-10-CM | POA: Diagnosis not present

## 2020-01-17 DIAGNOSIS — F4310 Post-traumatic stress disorder, unspecified: Secondary | ICD-10-CM | POA: Diagnosis not present

## 2020-01-17 DIAGNOSIS — F39 Unspecified mood [affective] disorder: Secondary | ICD-10-CM | POA: Diagnosis not present

## 2020-01-17 DIAGNOSIS — G4700 Insomnia, unspecified: Secondary | ICD-10-CM | POA: Diagnosis not present

## 2020-01-17 DIAGNOSIS — F5081 Binge eating disorder: Secondary | ICD-10-CM | POA: Diagnosis not present

## 2020-02-02 ENCOUNTER — Other Ambulatory Visit: Payer: Self-pay | Admitting: *Deleted

## 2020-02-02 ENCOUNTER — Other Ambulatory Visit: Payer: Self-pay | Admitting: Internal Medicine

## 2020-02-02 MED ORDER — METFORMIN HCL ER 500 MG PO TB24: 500.0000 mg | ORAL_TABLET | Freq: Two times a day (BID) | ORAL | 6 refills | Status: DC

## 2020-03-15 ENCOUNTER — Ambulatory Visit (INDEPENDENT_AMBULATORY_CARE_PROVIDER_SITE_OTHER): Payer: 59 | Admitting: Internal Medicine

## 2020-03-15 ENCOUNTER — Encounter: Payer: Self-pay | Admitting: Internal Medicine

## 2020-03-15 ENCOUNTER — Other Ambulatory Visit: Payer: Self-pay

## 2020-03-15 VITALS — BP 164/89 | HR 82 | Temp 97.8°F | Ht 60.0 in | Wt 209.8 lb

## 2020-03-15 DIAGNOSIS — E119 Type 2 diabetes mellitus without complications: Secondary | ICD-10-CM | POA: Insufficient documentation

## 2020-03-15 DIAGNOSIS — E669 Obesity, unspecified: Secondary | ICD-10-CM | POA: Insufficient documentation

## 2020-03-15 DIAGNOSIS — R300 Dysuria: Secondary | ICD-10-CM | POA: Insufficient documentation

## 2020-03-15 DIAGNOSIS — J069 Acute upper respiratory infection, unspecified: Secondary | ICD-10-CM

## 2020-03-15 DIAGNOSIS — E1165 Type 2 diabetes mellitus with hyperglycemia: Secondary | ICD-10-CM | POA: Insufficient documentation

## 2020-03-15 DIAGNOSIS — E1169 Type 2 diabetes mellitus with other specified complication: Secondary | ICD-10-CM | POA: Diagnosis not present

## 2020-03-15 LAB — POCT URINALYSIS DIPSTICK
Bilirubin, UA: NEGATIVE
Blood, UA: NEGATIVE
Glucose, UA: NEGATIVE
Nitrite, UA: POSITIVE
Protein, UA: NEGATIVE
Spec Grav, UA: 1.015 (ref 1.010–1.025)
Urobilinogen, UA: >=8 E.U./dL — AB
pH, UA: 7 (ref 5.0–8.0)

## 2020-03-15 LAB — GLUCOSE, POCT (MANUAL RESULT ENTRY): POC Glucose: 173 mg/dl — AB (ref 70–99)

## 2020-03-15 NOTE — Progress Notes (Addendum)
Established Patient Office Visit  SUBJECTIVE:  Patient ID: April Singleton, female    DOB: Oct 31, 1950  Age: 69 y.o. MRN: [id number]  CC:  Chief Complaint  Patient presents with  . Urinary Tract Infection    Burning during urination  . Sore Throat    x2 weeks  . Ear Pain    x2 weeks, left    HPI [name] presents for bilateral eat pain, worse in the left than the right. She has also has a sore throat and symptoms of a UTI.  She went to the beach two weeks ago. She notes that her granddaughter had the same symptoms as she did, but her eardrum actually burst. She feels like her throat feels swollen and her voice raspy. I have encouraged her to gargle with a salt water rinse amid negative physical exam results.   She notes that her blood sugars have been high. Dr. Francis Dowse took her A1C a few months ago and she notes that it was 7.0. She needs her blood sugar strips renewed. She notes that her blood sugar was 346 at one point. She checks her blood sugar 4 times per day, and more often if she feels bad. I have encouraged her to keep a log of her blood sugar.   She notes that she needs a physical at some point between now and July 1st, 2021.   Urinalysis today revealed small amount of Leukocytes and >=8.0 Urobilinogen. Macrobid prescribed to treat. Could be due to high blood sugar.   Past Medical History:  Diagnosis Date  . Asthma   . Diabetes mellitus without complication (HCC)   . Hypertension     Past Surgical History:  Procedure Laterality Date  . ABDOMINAL HYSTERECTOMY    . BREAST BIOPSY    . TONSILLECTOMY      History reviewed. No pertinent family history.  Social History   Socioeconomic History  . Marital status: Married    Spouse name: Not on file  . Number of children: Not on file  . Years of education: Not on file  . Highest education level: Not on file  Occupational History  . Not on file  Tobacco Use  . Smoking status: Never Smoker  . Smokeless  tobacco: Never Used  Substance and Sexual Activity  . Alcohol use: Not on file  . Drug use: Not on file  . Sexual activity: Not on file  Other Topics Concern  . Not on file  Social History Narrative  . Not on file   Social Determinants of Health   Financial Resource Strain:   . Difficulty of Paying Living Expenses:   Food Insecurity:   . Worried About Programme researcher, broadcasting/film/video in the Last Year:   . Barista in the Last Year:   Transportation Needs:   . Freight forwarder (Medical):   Marland Kitchen Lack of Transportation (Non-Medical):   Physical Activity:   . Days of Exercise per Week:   . Minutes of Exercise per Session:   Stress:   . Feeling of Stress :   Social Connections:   . Frequency of Communication with Friends and Family:   . Frequency of Social Gatherings with Friends and Family:   . Attends Religious Services:   . Active Member of Clubs or Organizations:   . Attends Banker Meetings:   Marland Kitchen Marital Status:   Intimate Partner Violence:   . Fear of Current or Ex-Partner:   . Emotionally Abused:   .  Physically Abused:   . Sexually Abused:      Current Outpatient Medications:  .  celecoxib (CELEBREX) 200 MG capsule, Take 1 capsule by mouth daily., Disp: , Rfl:  .  cholestyramine (QUESTRAN) 4 GM/DOSE powder, SMARTSIG:1 Each By Mouth Twice Daily, Disp: , Rfl:  .  clotrimazole-betamethasone (LOTRISONE) cream, , Disp: , Rfl:  .  estradiol (ESTRACE) 1 MG tablet, Take 1 mg by mouth daily., Disp: , Rfl:  .  metFORMIN (GLUCOPHAGE-XR) 500 MG 24 hr tablet, Take 1 tablet (500 mg total) by mouth 2 (two) times daily., Disp: 60 tablet, Rfl: 6 .  milk thistle 175 MG tablet, Take 175 mg by mouth daily., Disp: , Rfl:  .  pantoprazole (PROTONIX) 40 MG tablet, Take 40 mg by mouth daily., Disp: , Rfl:  .  phenazopyridine (PYRIDIUM) 200 MG tablet, Take 1 tablet (200 mg total) by mouth 3 (three) times daily as needed for pain., Disp: 10 tablet, Rfl: 0 .  sertraline (ZOLOFT) 100  MG tablet, Take 150 mg by mouth daily., Disp: , Rfl:  .  verapamil (CALAN-SR) 180 MG CR tablet, , Disp: , Rfl: 4 .  Verapamil HCl CR 200 MG CP24, Take 1 capsule by mouth daily., Disp: , Rfl:  .  VYVANSE 20 MG capsule, Take 20 mg by mouth at bedtime., Disp: , Rfl:  .  zolpidem (AMBIEN) 10 MG tablet, Take 10 mg by mouth at bedtime as needed., Disp: , Rfl:    Allergies  Allergen Reactions  . Codeine Other (See Comments)  . Penicillins   . Prednisone Other (See Comments)    Makes her violent   . Sulfa Antibiotics Hives    ROS Review of Systems  Constitutional: Positive for fever (occasional).  HENT: Positive for ear pain, sore throat and voice change. Negative for ear discharge and trouble swallowing.   Eyes: Negative.   Respiratory: Negative.   Cardiovascular: Negative.   Gastrointestinal: Negative.   Endocrine: Negative.   Genitourinary: Positive for dysuria.  Musculoskeletal: Negative.   Skin: Negative.   Allergic/Immunologic: Negative.   Neurological: Negative.   Hematological: Negative.   Psychiatric/Behavioral: Negative.   All other systems reviewed and are negative.     OBJECTIVE:    Physical Exam Vitals reviewed.  Constitutional:      Appearance: Normal appearance. She is well-developed.  HENT:     Right Ear: Hearing, ear canal and external ear normal. No swelling.     Left Ear: Hearing, ear canal and external ear normal. No swelling.     Mouth/Throat:     Mouth: Mucous membranes are moist.     Pharynx: No pharyngeal swelling or posterior oropharyngeal erythema.     Tonsils: No tonsillar exudate.  Eyes:     Pupils: Pupils are equal, round, and reactive to light.  Cardiovascular:     Rate and Rhythm: Normal rate and regular rhythm.     Pulses: Normal pulses.     Heart sounds: Normal heart sounds.  Pulmonary:     Effort: Pulmonary effort is normal.     Breath sounds: Normal breath sounds.  Abdominal:     Palpations: There is no hepatomegaly, splenomegaly or  mass.     Tenderness: There is no abdominal tenderness.  Musculoskeletal:     Right lower leg: No edema.     Left lower leg: No edema.  Neurological:     Mental Status: She is alert and oriented to person, place, and time.  Psychiatric:  Mood and Affect: Mood and affect normal.        Behavior: Behavior normal.        Thought Content: Thought content normal.        Judgment: Judgment normal.     BP (!) 164/89   Pulse 82   Temp 97.8 F (36.6 C) (Temporal)   Ht 5' (1.524 m)   Wt 209 lb 12.8 oz (95.2 kg)   BMI 40.97 kg/m  Wt Readings from Last 3 Encounters:  03/15/20 209 lb 12.8 oz (95.2 kg)  10/06/17 220 lb (99.8 kg)    Health Maintenance Due  Topic Date Due  . Hepatitis C Screening  Never done  . FOOT EXAM  Never done  . OPHTHALMOLOGY EXAM  Never done  . COVID-19 Vaccine (1) Never done  . TETANUS/TDAP  Never done  . COLONOSCOPY  Never done  . DEXA SCAN  Never done  . PNA vac Low Risk Adult (1 of 2 - PCV13) Never done  . HEMOGLOBIN A1C  11/22/2018  . URINE MICROALBUMIN  03/17/2019    There are no preventive care reminders to display for this patient.  CBC Latest Ref Rng & Units 03/16/2018 10/06/2017 01/05/2014  WBC 3.6 - 11.0 K/uL 5.3 4.7 5.3  Hemoglobin 12.0 - 16.0 g/dL 38.9 37.3 42.8  Hematocrit 35 - 47 % 40.2 42.7 40.6  Platelets 150 - 440 K/uL 177 149(L) 137(L)   CMP Latest Ref Rng & Units 07/17/2018 03/16/2018 10/06/2017  Glucose 65 - 99 mg/dL - 768(T) 157(W)  BUN 6 - 20 mg/dL - 10 9  Creatinine 6.20 - 1.00 mg/dL 3.55 9.74 1.63  Sodium 135 - 145 mmol/L - 137 133(L)  Potassium 3.5 - 5.1 mmol/L - 4.0 4.2  Chloride 101 - 111 mmol/L - 100(L) 100(L)  CO2 22 - 32 mmol/L - 25 25  Calcium 8.9 - 10.3 mg/dL - 9.2 8.4(T)  Total Protein 6.5 - 8.1 g/dL - - 7.5  Total Bilirubin 0.3 - 1.2 mg/dL - - 0.6  Alkaline Phos 38 - 126 U/L - - 92  AST 15 - 41 U/L - - 31  ALT 14 - 54 U/L - - 24    Lab Results  Component Value Date   TSH 2.682 03/16/2018   Lab Results    Component Value Date   ALBUMIN 3.7 10/06/2017   ANIONGAP 12 03/16/2018   Lab Results  Component Value Date   CHOL 281 (H) 03/16/2018   CHOL 259 (H) 11/29/2015   HDL 68 03/16/2018   HDL 59 11/29/2015   LDLCALC 136 (H) 03/16/2018   LDLCALC 151 (H) 11/29/2015   CHOLHDL 4.1 03/16/2018   CHOLHDL 4.4 11/29/2015   Lab Results  Component Value Date   TRIG 383 (H) 03/16/2018   Lab Results  Component Value Date   HGBA1C 7.4 (H) 05/22/2018   HGBA1C 7.1 (H) 03/16/2018   HGBA1C 5.7 09/03/2012      ASSESSMENT & PLAN:   Problem List Items Addressed This Visit      Endocrine   Type 2 diabetes mellitus with other specified complication (HCC) - Primary    Restarted on Farxiga 10 mg p.o. daily samples were given for 14 days if his blood sugar does not come down then we may have to switch her to insulin.      Relevant Orders   POCT glucose (manual entry) (Completed)     Other   Burning with urination    Started on Macrodantin  Relevant Orders   POCT urinalysis dipstick (Completed)   Obesity (BMI 35.0-39.9 without comorbidity)    - I encouraged the patient to lose weight. I educated them on making healthy dietary choices including eating more  vegetables and less fried foods. -           Relevant Medications   VYVANSE 20 MG capsule    Other Visit Diagnoses    Viral upper respiratory tract infection       Relevant Medications   clotrimazole-betamethasone (LOTRISONE) cream      No orders of the defined types were placed in this encounter.  1. Type 2 diabetes mellitus with other specified complication, unspecified whether long term insulin use (HCC)  - POCT glucose (240)  2. Burning with urination  - POCT urinalysis dipstick  3. Obesity (BMI 35.0-39.9 without comorbidity) 4.  Viral bronchitis Follow-up: Return in about 2 weeks (around 03/29/2020).   Given sample of Farxiga for blood sugar control.   Dr. Woodroe Chen [facility] [address] [place], [place] [address]   By signing my name below, I, [employer], attest that this documentation has been prepared under the direction and in the presence of Corky Downs, MD. Electronically Signed: Corky Downs, MD 03/15/20, 2:47 PM   {I personally performed the services described in this documentation, which was SCRIBED in my presence. The recorded information has been reviewed and considered accurate. It has been edited as necessary during review. Corky Downs, MD

## 2020-03-15 NOTE — Assessment & Plan Note (Addendum)
-   I encouraged the patient to lose weight. I educated them on making healthy dietary choices including eating more  vegetables and less fried foods. -

## 2020-03-15 NOTE — Assessment & Plan Note (Signed)
Started on Macrodantin 

## 2020-03-15 NOTE — Assessment & Plan Note (Signed)
Restarted on Farxiga 10 mg p.o. daily samples were given for 14 days if his blood sugar does not come down then we may have to switch her to insulin.

## 2020-03-16 ENCOUNTER — Other Ambulatory Visit: Payer: Self-pay

## 2020-03-16 MED ORDER — FREESTYLE LANCETS MISC
12 refills | Status: DC
Start: 2020-03-16 — End: 2021-08-13

## 2020-03-16 MED ORDER — NITROFURANTOIN MACROCRYSTAL 50 MG PO CAPS: 50.0000 mg | ORAL_CAPSULE | Freq: Four times a day (QID) | ORAL | 2 refills | Status: DC

## 2020-03-16 MED ORDER — FREESTYLE LITE TEST VI STRP
ORAL_STRIP | 12 refills | Status: DC
Start: 2020-03-16 — End: 2020-10-26

## 2020-03-16 NOTE — Addendum Note (Signed)
Addended by: Corky Downs on: 03/16/2020 12:04 PM   Modules accepted: Orders

## 2020-03-20 ENCOUNTER — Other Ambulatory Visit: Payer: Self-pay | Admitting: *Deleted

## 2020-03-20 MED ORDER — FLUCONAZOLE 150 MG PO TABS
150.0000 mg | ORAL_TABLET | Freq: Once | ORAL | 0 refills | Status: AC
Start: 2020-03-20 — End: 2020-03-20

## 2020-03-21 ENCOUNTER — Other Ambulatory Visit: Payer: Self-pay

## 2020-03-21 ENCOUNTER — Ambulatory Visit (INDEPENDENT_AMBULATORY_CARE_PROVIDER_SITE_OTHER): Payer: 59 | Admitting: Obstetrics and Gynecology

## 2020-03-21 ENCOUNTER — Encounter: Payer: Self-pay | Admitting: Obstetrics and Gynecology

## 2020-03-21 VITALS — BP 160/90 | Ht 60.0 in | Wt 208.0 lb

## 2020-03-21 DIAGNOSIS — R350 Frequency of micturition: Secondary | ICD-10-CM

## 2020-03-21 DIAGNOSIS — B373 Candidiasis of vulva and vagina: Secondary | ICD-10-CM

## 2020-03-21 DIAGNOSIS — R81 Glycosuria: Secondary | ICD-10-CM

## 2020-03-21 DIAGNOSIS — B3731 Acute candidiasis of vulva and vagina: Secondary | ICD-10-CM

## 2020-03-21 LAB — POCT WET PREP WITH KOH
Clue Cells Wet Prep HPF POC: NEGATIVE
KOH Prep POC: NEGATIVE
Trichomonas, UA: NEGATIVE
Yeast Wet Prep HPF POC: POSITIVE

## 2020-03-21 LAB — POCT URINALYSIS DIPSTICK
Bilirubin, UA: NEGATIVE
Blood, UA: NEGATIVE
Glucose, UA: POSITIVE — AB
Ketones, UA: NEGATIVE
Leukocytes, UA: NEGATIVE
Nitrite, UA: NEGATIVE
Protein, UA: NEGATIVE
Spec Grav, UA: 1.01 (ref 1.010–1.025)
pH, UA: 5 (ref 5.0–8.0)

## 2020-03-21 MED ORDER — TERCONAZOLE 0.4 % VA CREA: 1.0000 | TOPICAL_CREAM | Freq: Every day | VAGINAL | 0 refills | Status: DC

## 2020-03-21 MED ORDER — FLUCONAZOLE 150 MG PO TABS: 150.0000 mg | ORAL_TABLET | Freq: Once | ORAL | 0 refills | Status: AC

## 2020-03-21 NOTE — Patient Instructions (Signed)
I value your feedback and entrusting us with your care. If you get a [facility] patient survey, I would appreciate you taking the time to let us know about your experience today. Thank you!  As of September 09, 2019, your lab results will be released to your MyChart immediately, before I even have a chance to see them. Please give me time to review them and contact you if there are any abnormalities. Thank you for your patience.  

## 2020-03-21 NOTE — Progress Notes (Signed)
April Downs, MD   Chief Complaint  Patient presents with  . Vaginal Itching    irritation, odor, no discharge since Sun morning  . Urinary Tract Infection    frequency and burning urinating since Sun morning    HPI:      [name] is a 69 y.o. No obstetric history on file. whose LMP was No LMP recorded. Patient has had a hysterectomy., presents today for significant vaginal itching/irritation with swelling, dysuria, and discomfort for 3 days. Started abx a few days prior for UTI sx. Took a diflucan yesterday without sx relief.  Pt denies vag d/c. Has a hx of not well-controlled DM and was started on new DM medicine that had rare complication of necrotizing fasciitis and pt concerned about that. Stopped the DM medicine recently. Fasting glucose in 250s this AM. Has eaten since.   UTI symptoms of frequency improving, but still has dysuria. Has LBP, pelvic pain and chills. Treated for UTI with macrobid with PCP 03/15/20 for 10 days. Leuks on UA.    Past Medical History:  Diagnosis Date  . Asthma   . Diabetes mellitus without complication (HCC)   . Hypertension     Past Surgical History:  Procedure Laterality Date  . ABDOMINAL HYSTERECTOMY    . BREAST BIOPSY    . TONSILLECTOMY      Family History  Problem Relation Age of Onset  . Liver cancer Father   . Lung cancer Father     Social History   Socioeconomic History  . Marital status: Married    Spouse name: Not on file  . Number of children: Not on file  . Years of education: Not on file  . Highest education level: Not on file  Occupational History  . Not on file  Tobacco Use  . Smoking status: Never Smoker  . Smokeless tobacco: Never Used  Vaping Use  . Vaping Use: Never used  Substance and Sexual Activity  . Alcohol use: Yes    Alcohol/week: 0.0 standard drinks  . Drug use: Never  . Sexual activity: Yes    Birth control/protection: Surgical    Comment: Hysterectomy  Other Topics Concern  . Not  on file  Social History Narrative  . Not on file   Social Determinants of Health   Financial Resource Strain:   . Difficulty of Paying Living Expenses:   Food Insecurity:   . Worried About Programme researcher, broadcasting/film/video in the Last Year:   . Barista in the Last Year:   Transportation Needs:   . Freight forwarder (Medical):   Marland Kitchen Lack of Transportation (Non-Medical):   Physical Activity:   . Days of Exercise per Week:   . Minutes of Exercise per Session:   Stress:   . Feeling of Stress :   Social Connections:   . Frequency of Communication with Friends and Family:   . Frequency of Social Gatherings with Friends and Family:   . Attends Religious Services:   . Active Member of Clubs or Organizations:   . Attends Banker Meetings:   Marland Kitchen Marital Status:   Intimate Partner Violence:   . Fear of Current or Ex-Partner:   . Emotionally Abused:   Marland Kitchen Physically Abused:   . Sexually Abused:     Outpatient Medications Prior to Visit  Medication Sig Dispense Refill  . celecoxib (CELEBREX) 200 MG capsule Take 1 capsule by mouth daily.    . cholestyramine Lanetta Inch)  4 GM/DOSE powder SMARTSIG:1 Each By Mouth Twice Daily    . clotrimazole-betamethasone (LOTRISONE) cream     . estradiol (ESTRACE) 1 MG tablet Take 1 mg by mouth daily.    Marland Kitchen glucose blood (FREESTYLE LITE) test strip Use as instructed 100 each 12  . Lancets (FREESTYLE) lancets Use as instructed 100 each 12  . metFORMIN (GLUCOPHAGE-XR) 500 MG 24 hr tablet Take 1 tablet (500 mg total) by mouth 2 (two) times daily. 60 tablet 6  . milk thistle 175 MG tablet Take 175 mg by mouth daily.    . nitrofurantoin (MACRODANTIN) 50 MG capsule Take 1 capsule (50 mg total) by mouth 4 (four) times daily. 40 capsule 2  . pantoprazole (PROTONIX) 40 MG tablet Take 40 mg by mouth daily.    . phenazopyridine (PYRIDIUM) 200 MG tablet Take 1 tablet (200 mg total) by mouth 3 (three) times daily as needed for pain. 10 tablet 0  . sertraline  (ZOLOFT) 100 MG tablet Take 150 mg by mouth daily.    . verapamil (CALAN-SR) 180 MG CR tablet   4  . Verapamil HCl CR 200 MG CP24 Take 1 capsule by mouth daily.    Marland Kitchen VYVANSE 20 MG capsule Take 20 mg by mouth at bedtime.    Marland Kitchen zolpidem (AMBIEN) 10 MG tablet Take 10 mg by mouth at bedtime as needed.     No facility-administered medications prior to visit.      ROS:  Review of Systems  Constitutional: Negative for fever.  Gastrointestinal: Negative for blood in stool, constipation, diarrhea, nausea and vomiting.  Genitourinary: Positive for dysuria, frequency and vaginal pain. Negative for dyspareunia, flank pain, hematuria, urgency, vaginal bleeding and vaginal discharge.  Musculoskeletal: Negative for back pain.  Skin: Negative for rash.    OBJECTIVE:   Vitals:  BP (!) 160/90   Ht 5' (1.524 m)   Wt 208 lb (94.3 kg)   BMI 40.62 kg/m   Physical Exam Vitals reviewed.  Constitutional:      Appearance: She is well-developed.  Pulmonary:     Effort: Pulmonary effort is normal.  Genitourinary:    Pubic Area: No rash.      Labia:        Right: Rash and tenderness present. No lesion.        Left: Rash and tenderness present. No lesion.      Vagina: Vaginal discharge present. No erythema or tenderness.     Uterus: Absent. Not enlarged and not tender.      Adnexa: Right adnexa normal and left adnexa normal.       Right: No mass or tenderness.         Left: No mass or tenderness.       Comments: ERYTHEMA, SWELLING WITH FISSURES BILAT LABIA MAJORA AND MINORA; NO ULCERATIVE LESIONS Musculoskeletal:        General: Normal range of motion.     Cervical back: Normal range of motion.  Skin:    General: Skin is warm and dry.  Neurological:     General: No focal deficit present.     Mental Status: She is alert and oriented to person, place, and time.  Psychiatric:        Mood and Affect: Mood normal.        Behavior: Behavior normal.        Thought Content: Thought content normal.         Judgment: Judgment normal.     Results: Results for orders  placed or performed in visit on 03/21/20 (from the past 24 hour(s))  POCT Urinalysis Dipstick     Status: Abnormal   Collection Time: 03/21/20  2:31 PM  Result Value Ref Range   Color, UA yellow    Clarity, UA clear    Glucose, UA Positive (A) Negative   Bilirubin, UA neg    Ketones, UA neg    Spec Grav, UA 1.010 1.010 - 1.025   Blood, UA neg    pH, UA 5.0 5.0 - 8.0   Protein, UA Negative Negative   Urobilinogen, UA     Nitrite, UA neg    Leukocytes, UA Negative Negative   Appearance     Odor    POCT Wet Prep with KOH     Status: Abnormal   Collection Time: 03/21/20  2:32 PM  Result Value Ref Range   Trichomonas, UA Negative    Clue Cells Wet Prep HPF POC neg    Epithelial Wet Prep HPF POC     Yeast Wet Prep HPF POC pos    Bacteria Wet Prep HPF POC     RBC Wet Prep HPF POC     WBC Wet Prep HPF POC     KOH Prep POC Negative Negative   Large glucose on UA.   Assessment/Plan: Candidal vaginitis - Plan: terconazole (TERAZOL 7) 0.4 % vaginal cream, fluconazole (DIFLUCAN) 150 MG tablet, POCT Wet Prep with KOH; Pos sx, exam, and wet prep. Given severity, Rx diflucan for 2nd dose (pt aware it needed more time to start working) and terazol. No evid of necrotizing fasciitis--reassurance to pt. Cool compresses. F/u prn.   Urinary frequency - Plan: POCT Urinalysis Dipstick; Neg UA. Being treated for UTI. Dysuria part of vaginitis sx. Frequency could also be polyuria with DM.   Glucosuria--instructed pt to f/u with PCP re: DM meds she's no longer taking.     Meds ordered this encounter  Medications  . terconazole (TERAZOL 7) 0.4 % vaginal cream    Sig: Place 1 applicator vaginally at bedtime.    Dispense:  45 g    Refill:  0    Order Specific Question:   Supervising Provider    Answer:   Gae Dry [id number]  . fluconazole (DIFLUCAN) 150 MG tablet    Sig: Take 1 tablet (150 mg total) by mouth once for 1  dose.    Dispense:  1 tablet    Refill:  0    Order Specific Question:   Supervising Provider    Answer:   Gae Dry [[id number]      Return if symptoms worsen or fail to improve.  Kazue Cerro B. Maat Kafer, PA-C 03/21/2020 2:34 PM

## 2020-03-27 ENCOUNTER — Other Ambulatory Visit: Payer: Self-pay

## 2020-03-27 ENCOUNTER — Encounter: Payer: Self-pay | Admitting: Internal Medicine

## 2020-03-27 ENCOUNTER — Ambulatory Visit (INDEPENDENT_AMBULATORY_CARE_PROVIDER_SITE_OTHER): Payer: 59 | Admitting: Internal Medicine

## 2020-03-27 VITALS — BP 136/87 | HR 86 | Ht 60.0 in | Wt 208.6 lb

## 2020-03-27 DIAGNOSIS — E669 Obesity, unspecified: Secondary | ICD-10-CM | POA: Diagnosis not present

## 2020-03-27 DIAGNOSIS — M12812 Other specific arthropathies, not elsewhere classified, left shoulder: Secondary | ICD-10-CM | POA: Diagnosis not present

## 2020-03-27 DIAGNOSIS — Z6841 Body Mass Index (BMI) 40.0 and over, adult: Secondary | ICD-10-CM | POA: Diagnosis not present

## 2020-03-27 DIAGNOSIS — E1169 Type 2 diabetes mellitus with other specified complication: Secondary | ICD-10-CM

## 2020-03-27 DIAGNOSIS — E66812 Obesity, class 2: Secondary | ICD-10-CM | POA: Insufficient documentation

## 2020-03-27 DIAGNOSIS — I10 Essential (primary) hypertension: Secondary | ICD-10-CM | POA: Insufficient documentation

## 2020-03-27 DIAGNOSIS — E6609 Other obesity due to excess calories: Secondary | ICD-10-CM | POA: Insufficient documentation

## 2020-03-27 DIAGNOSIS — Z0000 Encounter for general adult medical examination without abnormal findings: Secondary | ICD-10-CM | POA: Diagnosis not present

## 2020-03-27 NOTE — Assessment & Plan Note (Addendum)
-   I encouraged the patient to lose weight.  - I educated them on making healthy dietary choices including eating more fruits and vegetables and less fried foods. - I encouraged the patient to exercise more, and educated on the benefits of exercise including weight loss, diabetes management, and hypertension management.  Patient was given a shot of Ozempic 0.25 mg subcu on the left side of the abdomen.  Might help with obesity.

## 2020-03-27 NOTE — Assessment & Plan Note (Signed)
-   The patient's blood sugar is not under control  On metformin  - The patient will change the current treatment regimen.  To ozempic  .25 mg s/c  once a wk - I encouraged the patient to regularly check blood sugar.  - I encouraged the patient to monitor diet. I encouraged the patient to eat low-carb and low-sugar to help prevent blood sugar spikes.  - I encouraged the patient to continue following their prescribed treatment plan for diabetes - I informed the patient to get help if blood sugar drops below 54mg /dL, or if suddenly have trouble thinking clearly or breathing.

## 2020-03-27 NOTE — Assessment & Plan Note (Signed)
Hypertension is under control.  Patient diabetes is still labile.  She could not take the [place] because it causes her yeast infection.  she was advised to lose weight on antidiabetic diet.  she is limited with the left shoulder because of the rotator cuff and shoulder exercises were suggested.  I gave her injection of Ozempic 0.25 mg subcu left side of the abdomen and given her some samples to try , that might help her to lose weight.   if She tolerated it well and I will send her the prescription.

## 2020-03-27 NOTE — Assessment & Plan Note (Signed)
Patient has left rotator cuff disorder.  She was referred to orthopedic surgeon.  She will also instructed to do shoulder exercises on the left and the right side.  She will also look the exercises in the YouTube.

## 2020-03-27 NOTE — Assessment & Plan Note (Signed)
-   Today, the patient's blood pressure is well managed on verapamil. - The patient will continue the current treatment regimen.  - I encouraged the patient to eat a low-sodium diet to help control blood pressure. - I encouraged the patient to live an active lifestyle and complete activities that increases heart rate to 85% target heart rate at least 5 times per week for one hour.     

## 2020-03-27 NOTE — Progress Notes (Signed)
Established Patient Office Visit  SUBJECTIVE:  Subjective  Patient ID: April Singleton, female    DOB: 1951/04/19  Age: 69 y.o. MRN: [id number]  CC:  Chief Complaint  Patient presents with  . Annual Exam    HPI April Singleton is a 69 y.o. female presenting today for her annual physical.  Today she reports that her fasting blood sugar this morning was 168; she checks her blood sugar daily. She reports that she had tried [place], but got a yeast infection and reports that she stopped taking it; she has received 2 doses of diflucan so far. She is almost done with her Macrodantin for her UTI. She reports getting occasional left-sided chest pain which gets worse with emotions. She reports having left shoulder pain, particularly in the scapula.  She is compliant with all of her other meds. She inquires today about semaglutide for her blood sugar and obesity; she denies any family history of thyroid cancer. She reports that it is time for her mammogram.  Past Medical History:  Diagnosis Date  . Asthma   . Diabetes mellitus without complication (HCC)   . Hypertension     Past Surgical History:  Procedure Laterality Date  . ABDOMINAL HYSTERECTOMY    . BREAST BIOPSY    . TONSILLECTOMY      Family History  Problem Relation Age of Onset  . Liver cancer Father   . Lung cancer Father     Social History   Socioeconomic History  . Marital status: Married    Spouse name: Not on file  . Number of children: Not on file  . Years of education: Not on file  . Highest education level: Not on file  Occupational History  . Not on file  Tobacco Use  . Smoking status: Never Smoker  . Smokeless tobacco: Never Used  Vaping Use  . Vaping Use: Never used  Substance and Sexual Activity  . Alcohol use: Yes    Alcohol/week: 0.0 standard drinks  . Drug use: Never  . Sexual activity: Yes    Birth control/protection: Surgical    Comment: Hysterectomy  Other Topics Concern  . Not on file   Social History Narrative  . Not on file   Social Determinants of Health   Financial Resource Strain:   . Difficulty of Paying Living Expenses:   Food Insecurity:   . Worried About Programme researcher, broadcasting/film/video in the Last Year:   . Barista in the Last Year:   Transportation Needs:   . Freight forwarder (Medical):   Marland Kitchen Lack of Transportation (Non-Medical):   Physical Activity:   . Days of Exercise per Week:   . Minutes of Exercise per Session:   Stress:   . Feeling of Stress :   Social Connections:   . Frequency of Communication with Friends and Family:   . Frequency of Social Gatherings with Friends and Family:   . Attends Religious Services:   . Active Member of Clubs or Organizations:   . Attends Banker Meetings:   Marland Kitchen Marital Status:   Intimate Partner Violence:   . Fear of Current or Ex-Partner:   . Emotionally Abused:   Marland Kitchen Physically Abused:   . Sexually Abused:      Current Outpatient Medications:  .  celecoxib (CELEBREX) 200 MG capsule, Take 1 capsule by mouth daily., Disp: , Rfl:  .  cholestyramine (QUESTRAN) 4 GM/DOSE powder, SMARTSIG:1 Each By Mouth Twice Daily, Disp: ,  Rfl:  .  clotrimazole-betamethasone (LOTRISONE) cream, , Disp: , Rfl:  .  estradiol (ESTRACE) 1 MG tablet, Take 1 mg by mouth daily., Disp: , Rfl:  .  glucose blood (FREESTYLE LITE) test strip, Use as instructed, Disp: 100 each, Rfl: 12 .  Lancets (FREESTYLE) lancets, Use as instructed, Disp: 100 each, Rfl: 12 .  metFORMIN (GLUCOPHAGE-XR) 500 MG 24 hr tablet, Take 1 tablet (500 mg total) by mouth 2 (two) times daily., Disp: 60 tablet, Rfl: 6 .  milk thistle 175 MG tablet, Take 175 mg by mouth daily., Disp: , Rfl:  .  nitrofurantoin (MACRODANTIN) 50 MG capsule, Take 1 capsule (50 mg total) by mouth 4 (four) times daily., Disp: 40 capsule, Rfl: 2 .  pantoprazole (PROTONIX) 40 MG tablet, Take 40 mg by mouth daily., Disp: , Rfl:  .  phenazopyridine (PYRIDIUM) 200 MG tablet, Take 1 tablet  (200 mg total) by mouth 3 (three) times daily as needed for pain., Disp: 10 tablet, Rfl: 0 .  sertraline (ZOLOFT) 100 MG tablet, Take 150 mg by mouth daily., Disp: , Rfl:  .  terconazole (TERAZOL 7) 0.4 % vaginal cream, Place 1 applicator vaginally at bedtime., Disp: 45 g, Rfl: 0 .  verapamil (CALAN-SR) 180 MG CR tablet, , Disp: , Rfl: 4 .  Verapamil HCl CR 200 MG CP24, Take 1 capsule by mouth daily., Disp: , Rfl:  .  VYVANSE 20 MG capsule, Take 20 mg by mouth at bedtime., Disp: , Rfl:  .  zolpidem (AMBIEN) 10 MG tablet, Take 10 mg by mouth at bedtime as needed., Disp: , Rfl:    Allergies  Allergen Reactions  . Codeine Other (See Comments)  . Penicillins   . Prednisone Other (See Comments)    Makes her violent   . Sulfa Antibiotics Hives    ROS Review of Systems  Constitutional: Negative.   HENT: Negative.   Eyes: Negative.   Respiratory: Negative.   Cardiovascular: Positive for chest pain (L side of chest, occasional).  Gastrointestinal: Negative.   Endocrine: Negative.   Genitourinary: Negative.   Musculoskeletal: Negative.   Skin: Negative.   Allergic/Immunologic: Negative.   Neurological: Negative.   Hematological: Negative.   Psychiatric/Behavioral: Negative.   All other systems reviewed and are negative.    OBJECTIVE:    Physical Exam Vitals reviewed.  Constitutional:      Appearance: Normal appearance.  HENT:     Right Ear: Ear canal and external ear normal. There is no impacted cerumen. No foreign body.     Left Ear: Ear canal and external ear normal. There is no impacted cerumen. No foreign body.     Mouth/Throat:     Mouth: Mucous membranes are moist.  Eyes:     Pupils: Pupils are equal, round, and reactive to light. Pupils are equal.  Cardiovascular:     Rate and Rhythm: Normal rate and regular rhythm.     Pulses: Normal pulses.          Dorsalis pedis pulses are 2+ on the right side and 2+ on the left side.     Heart sounds: Normal heart sounds.   Pulmonary:     Effort: Pulmonary effort is normal.     Breath sounds: Normal breath sounds.  Chest:     Chest wall: Tenderness (L side chest wall tenderness to palpation) present.  Abdominal:     Palpations: Abdomen is soft. There is no hepatomegaly, splenomegaly or mass.     Tenderness: There is abdominal  tenderness in the right upper quadrant. There is no guarding.     Hernia: No hernia is present.  Musculoskeletal:     Left shoulder: Bony tenderness (anterior and posterior humeral head) present. Decreased range of motion (L side). Decreased strength (L sided weakness compared to R).     Right lower leg: No edema.     Left lower leg: No edema.     Comments: Varicose veins in bilat lower extremities  Neurological:     Mental Status: She is alert and oriented to person, place, and time.  Psychiatric:        Mood and Affect: Mood and affect normal.        Behavior: Behavior normal.     BP 136/87   Pulse 86   Ht 5' (1.524 m)   Wt 208 lb 9.6 oz (94.6 kg)   BMI 40.74 kg/m  Wt Readings from Last 3 Encounters:  03/27/20 208 lb 9.6 oz (94.6 kg)  03/21/20 208 lb (94.3 kg)  03/15/20 209 lb 12.8 oz (95.2 kg)    Health Maintenance Due  Topic Date Due  . Hepatitis C Screening  Never done  . FOOT EXAM  Never done  . OPHTHALMOLOGY EXAM  Never done  . COVID-19 Vaccine (1) Never done  . TETANUS/TDAP  Never done  . COLONOSCOPY  Never done  . DEXA SCAN  Never done  . PNA vac Low Risk Adult (1 of 2 - PCV13) Never done  . HEMOGLOBIN A1C  11/22/2018  . URINE MICROALBUMIN  03/17/2019    There are no preventive care reminders to display for this patient.  CBC Latest Ref Rng & Units 03/16/2018 10/06/2017 01/05/2014  WBC 3.6 - 11.0 K/uL 5.3 4.7 5.3  Hemoglobin 12.0 - 16.0 g/dL 13.7 14.4 13.6  Hematocrit 35 - 47 % 40.2 42.7 40.6  Platelets 150 - 440 K/uL 177 149(L) 137(L)   CMP Latest Ref Rng & Units 07/17/2018 03/16/2018 10/06/2017  Glucose 65 - 99 mg/dL - 117(H) 167(H)  BUN 6 - 20 mg/dL -  10 9  Creatinine 0.44 - 1.00 mg/dL 0.50 0.54 0.62  Sodium 135 - 145 mmol/L - 137 133(L)  Potassium 3.5 - 5.1 mmol/L - 4.0 4.2  Chloride 101 - 111 mmol/L - 100(L) 100(L)  CO2 22 - 32 mmol/L - 25 25  Calcium 8.9 - 10.3 mg/dL - 9.2 8.8(L)  Total Protein 6.5 - 8.1 g/dL - - 7.5  Total Bilirubin 0.3 - 1.2 mg/dL - - 0.6  Alkaline Phos 38 - 126 U/L - - 92  AST 15 - 41 U/L - - 31  ALT 14 - 54 U/L - - 24    Lab Results  Component Value Date   TSH 2.682 03/16/2018   Lab Results  Component Value Date   ALBUMIN 3.7 10/06/2017   ANIONGAP 12 03/16/2018   Lab Results  Component Value Date   CHOL 281 (H) 03/16/2018   CHOL 259 (H) 11/29/2015   HDL 68 03/16/2018   HDL 59 11/29/2015   LDLCALC 136 (H) 03/16/2018   LDLCALC 151 (H) 11/29/2015   CHOLHDL 4.1 03/16/2018   CHOLHDL 4.4 11/29/2015   Lab Results  Component Value Date   TRIG 383 (H) 03/16/2018   Lab Results  Component Value Date   HGBA1C 7.4 (H) 05/22/2018   HGBA1C 7.1 (H) 03/16/2018   HGBA1C 5.7 09/03/2012      ASSESSMENT & PLAN:   Problem List Items Addressed This Visit  Cardiovascular and Mediastinum   Essential hypertension    - Today, the patient's blood pressure is well managed on verapamil - The patient will continue the current treatment regimen.  - I encouraged the patient to eat a low-sodium diet to help control blood pressure. - I encouraged the patient to live an active lifestyle and complete activities that increases heart rate to 85% target heart rate at least 5 times per week for one hour.           Endocrine   Type 2 diabetes mellitus with other specified complication (HCC) - Primary    - The patient's blood sugar is not under control  On metformin  - The patient will change the current treatment regimen.  To ozempic  .25 mg s/c  once a wk - I encouraged the patient to regularly check blood sugar.  - I encouraged the patient to monitor diet. I encouraged the patient to eat low-carb and low-sugar  to help prevent blood sugar spikes.  - I encouraged the patient to continue following their prescribed treatment plan for diabetes - I informed the patient to get help if blood sugar drops below 54mg /dL, or if suddenly have trouble thinking clearly or breathing.         Relevant Orders   CBC with Differential/Platelet   COMPLETE METABOLIC PANEL WITH GFR   Lipid Panel With LDL/HDL Ratio   TSH     Musculoskeletal and Integument   Rotator cuff arthropathy of left shoulder    Patient has left rotator cuff disorder.  She was referred to orthopedic surgeon.  She will also instructed to do shoulder exercises on the left and the right side.  She will also look the exercises in the YouTube.        Other   Obesity (BMI 35.0-39.9 without comorbidity)   Relevant Orders   CBC with Differential/Platelet   COMPLETE METABOLIC PANEL WITH GFR   Lipid Panel With LDL/HDL Ratio   TSH   Class 3 severe obesity due to excess calories with serious comorbidity and body mass index (BMI) of 40.0 to 44.9 in adult [facility](HCC)    - I encouraged the patient to lose weight.  - I educated them on making healthy dietary choices including eating more fruits and vegetables and less fried foods. - I encouraged the patient to exercise more, and educated on the benefits of exercise including weight loss, diabetes management, and hypertension management.  Patient was given a shot of Ozempic 0.25 mg subcu on the left side of the abdomen.  Might help with obesity.       Annual physical exam    Hypertension is under control.  Patient diabetes is still labile.  She could not take the [place] because it causes her yeast infection.  she was advised to lose weight on antidiabetic diet.  she is limited with the left shoulder because of the rotator cuff and shoulder exercises were suggested.  I gave her injection of Ozempic 0.25 mg subcu left side of the abdomen and given her some samples to try , that might help her to lose weight.    if She tolerated it well and I will send her the prescription.         No orders of the defined types were placed in this encounter.   Type 2 diabetes mellitus with other specified complication, unspecified whether long term insulin use (HCC) - Plan: CBC with Differential/Platelet, COMPLETE METABOLIC PANEL WITH GFR, Lipid Panel With LDL/HDL  Ratio, TSH  Obesity (BMI 35.0-39.9 without comorbidity) - Plan: CBC with Differential/Platelet, COMPLETE METABOLIC PANEL WITH GFR, Lipid Panel With LDL/HDL Ratio, TSH  Rotator cuff arthropathy of left shoulder  Class 3 severe obesity due to excess calories with serious comorbidity and body mass index (BMI) of 40.0 to 44.9 in adult [facility])  Annual physical exam  Essential hypertension   Follow-up: Return in about 2 weeks (around 04/10/2020).    Dr. Woodroe Chen [facility] [address], [place], [place] [address]   By signing my name below, I, Drue Second, attest that this documentation has been prepared under the direction and in the presence of Corky Downs, MD. Electronically Signed: Corky Downs, MD 03/27/20, 10:08 AM   I personally performed the services described in this documentation, which was SCRIBED in my presence. The recorded information has been reviewed and considered accurate. It has been edited as necessary during review. Corky Downs, MD

## 2020-03-28 ENCOUNTER — Ambulatory Visit: Payer: 59 | Admitting: Internal Medicine

## 2020-03-28 LAB — COMPLETE METABOLIC PANEL WITH GFR
AG Ratio: 1.2 (calc) (ref 1.0–2.5)
ALT: 28 U/L (ref 6–29)
AST: 30 U/L (ref 10–35)
Albumin: 3.9 g/dL (ref 3.6–5.1)
Alkaline phosphatase (APISO): 85 U/L (ref 37–153)
BUN: 14 mg/dL (ref 7–25)
CO2: 20 mmol/L (ref 20–32)
Calcium: 9.5 mg/dL (ref 8.6–10.4)
Chloride: 104 mmol/L (ref 98–110)
Creat: 0.72 mg/dL (ref 0.50–0.99)
GFR, Est African American: 100 mL/min/{1.73_m2} (ref >=60)
GFR, Est Non African American: 87 mL/min/{1.73_m2} (ref >=60)
Globulin: 3.3 g/dL (calc) (ref 1.9–3.7)
Glucose, Bld: 166 mg/dL — ABNORMAL HIGH (ref 65–99)
Potassium: 4.7 mmol/L (ref 3.5–5.3)
Sodium: 138 mmol/L (ref 135–146)
Total Bilirubin: 0.3 mg/dL (ref 0.2–1.2)
Total Protein: 7.2 g/dL (ref 6.1–8.1)

## 2020-03-28 LAB — CBC WITH DIFFERENTIAL/PLATELET
Absolute Monocytes: 594 cells/uL (ref 200–950)
Basophils Absolute: 30 cells/uL (ref 0–200)
Basophils Relative: 0.5 %
Eosinophils Absolute: 108 cells/uL (ref 15–500)
Eosinophils Relative: 1.8 %
HCT: 42.2 % (ref 35.0–45.0)
Hemoglobin: 13.1 g/dL (ref 11.7–15.5)
Lymphs Abs: 1500 cells/uL (ref 850–3900)
MCH: 25.3 pg — ABNORMAL LOW (ref 27.0–33.0)
MCHC: 31 g/dL — ABNORMAL LOW (ref 32.0–36.0)
MCV: 81.6 fL (ref 80.0–100.0)
MPV: 10.5 fL (ref 7.5–12.5)
Monocytes Relative: 9.9 %
Neutro Abs: 3768 cells/uL (ref 1500–7800)
Neutrophils Relative %: 62.8 %
Platelets: 189 10*3/uL (ref 140–400)
RBC: 5.17 10*6/uL — ABNORMAL HIGH (ref 3.80–5.10)
RDW: 15 % (ref 11.0–15.0)
Total Lymphocyte: 25 %
WBC: 6 10*3/uL (ref 3.8–10.8)

## 2020-03-28 LAB — TSH: TSH: 1.85 mIU/L (ref 0.40–4.50)

## 2020-04-13 ENCOUNTER — Encounter: Payer: Self-pay | Admitting: Internal Medicine

## 2020-04-13 ENCOUNTER — Ambulatory Visit (INDEPENDENT_AMBULATORY_CARE_PROVIDER_SITE_OTHER): Payer: 59 | Admitting: Internal Medicine

## 2020-04-13 ENCOUNTER — Other Ambulatory Visit: Payer: Self-pay

## 2020-04-13 VITALS — BP 166/94 | HR 90 | Ht 60.0 in | Wt 205.9 lb

## 2020-04-13 DIAGNOSIS — K5792 Diverticulitis of intestine, part unspecified, without perforation or abscess without bleeding: Secondary | ICD-10-CM | POA: Diagnosis not present

## 2020-04-13 DIAGNOSIS — F5081 Binge eating disorder: Secondary | ICD-10-CM | POA: Diagnosis not present

## 2020-04-13 DIAGNOSIS — I10 Essential (primary) hypertension: Secondary | ICD-10-CM | POA: Diagnosis not present

## 2020-04-13 DIAGNOSIS — G4700 Insomnia, unspecified: Secondary | ICD-10-CM | POA: Diagnosis not present

## 2020-04-13 DIAGNOSIS — N390 Urinary tract infection, site not specified: Secondary | ICD-10-CM | POA: Diagnosis not present

## 2020-04-13 DIAGNOSIS — E1169 Type 2 diabetes mellitus with other specified complication: Secondary | ICD-10-CM | POA: Diagnosis not present

## 2020-04-13 DIAGNOSIS — F39 Unspecified mood [affective] disorder: Secondary | ICD-10-CM | POA: Diagnosis not present

## 2020-04-13 DIAGNOSIS — E669 Obesity, unspecified: Secondary | ICD-10-CM

## 2020-04-13 DIAGNOSIS — F4310 Post-traumatic stress disorder, unspecified: Secondary | ICD-10-CM | POA: Diagnosis not present

## 2020-04-13 LAB — POCT URINALYSIS DIPSTICK
Bilirubin, UA: NEGATIVE
Blood, UA: NEGATIVE
Glucose, UA: NEGATIVE
Ketones, UA: POSITIVE
Leukocytes, UA: NEGATIVE
Nitrite, UA: POSITIVE
Protein, UA: POSITIVE — AB
Spec Grav, UA: 1.02 (ref 1.010–1.025)
Urobilinogen, UA: NEGATIVE E.U./dL — AB
pH, UA: 5 (ref 5.0–8.0)

## 2020-04-13 MED ORDER — CIPROFLOXACIN HCL 500 MG PO TABS: 500.0000 mg | ORAL_TABLET | Freq: Two times a day (BID) | ORAL | 0 refills | Status: DC

## 2020-04-13 MED ORDER — TRAMADOL HCL 50 MG PO TABS: 50.0000 mg | ORAL_TABLET | Freq: Three times a day (TID) | ORAL | 0 refills | Status: AC | PRN

## 2020-04-13 NOTE — Progress Notes (Signed)
Patient ID: April Singleton, female   DOB: 11-Jan-1951, 69 y.o.   MRN: [id number]    Established Patient Office Visit  Subjective:  Patient ID: April ChandlerJessie B Singleton, female    DOB: 11-Jan-1951  Age: 69 y.o. MRN: [id number]  CC:  Chief Complaint  Patient presents with  . Lab results    HPI  April Singleton presents for left sided back pain x 1 month. She did not lift anything heavy recently. She reports that it feels like a kidney stone. She has had four kidney stones on the left side. Denies rash. Her blood sugar this morning was 188 but she took it after she ate. She checks her blood sugar 2-4 times daily. She is almost out of her Celebrex.  Past Medical History:  Diagnosis Date  . Asthma   . Diabetes mellitus without complication (HCC)   . Hypertension     Past Surgical History:  Procedure Laterality Date  . ABDOMINAL HYSTERECTOMY    . BREAST BIOPSY    . TONSILLECTOMY      Family History  Problem Relation Age of Onset  . Liver cancer Father   . Lung cancer Father     Social History   Socioeconomic History  . Marital status: Married    Spouse name: Not on file  . Number of children: Not on file  . Years of education: Not on file  . Highest education level: Not on file  Occupational History  . Not on file  Tobacco Use  . Smoking status: Never Smoker  . Smokeless tobacco: Never Used  Vaping Use  . Vaping Use: Never used  Substance and Sexual Activity  . Alcohol use: Yes    Alcohol/week: 0.0 standard drinks  . Drug use: Never  . Sexual activity: Yes    Birth control/protection: Surgical    Comment: Hysterectomy  Other Topics Concern  . Not on file  Social History Narrative  . Not on file   Social Determinants of Health   Financial Resource Strain:   . Difficulty of Paying Living Expenses:   Food Insecurity:   . Worried About Programme researcher, broadcasting/film/videounning Out of Food in the Last Year:   . Baristaan Out of Food in the Last Year:   Transportation Needs:   . Freight forwarderLack of Transportation  (Medical):   Marland Kitchen. Lack of Transportation (Non-Medical):   Physical Activity:   . Days of Exercise per Week:   . Minutes of Exercise per Session:   Stress:   . Feeling of Stress :   Social Connections:   . Frequency of Communication with Friends and Family:   . Frequency of Social Gatherings with Friends and Family:   . Attends Religious Services:   . Active Member of Clubs or Organizations:   . Attends BankerClub or Organization Meetings:   Marland Kitchen. Marital Status:   Intimate Partner Violence:   . Fear of Current or Ex-Partner:   . Emotionally Abused:   Marland Kitchen. Physically Abused:   . Sexually Abused:      Current Outpatient Medications:  .  celecoxib (CELEBREX) 200 MG capsule, Take 1 capsule by mouth daily., Disp: , Rfl:  .  cholestyramine (QUESTRAN) 4 GM/DOSE powder, SMARTSIG:1 Each By Mouth Twice Daily, Disp: , Rfl:  .  clotrimazole-betamethasone (LOTRISONE) cream, , Disp: , Rfl:  .  estradiol (ESTRACE) 1 MG tablet, Take 1 mg by mouth daily., Disp: , Rfl:  .  glucose blood (FREESTYLE LITE) test strip, Use as instructed, Disp: 100 each, Rfl: 12 .  Lancets (FREESTYLE) lancets, Use as instructed, Disp: 100 each, Rfl: 12 .  metFORMIN (GLUCOPHAGE-XR) 500 MG 24 hr tablet, Take 1 tablet (500 mg total) by mouth 2 (two) times daily., Disp: 60 tablet, Rfl: 6 .  milk thistle 175 MG tablet, Take 175 mg by mouth daily., Disp: , Rfl:  .  pantoprazole (PROTONIX) 40 MG tablet, Take 40 mg by mouth daily., Disp: , Rfl:  .  phenazopyridine (PYRIDIUM) 200 MG tablet, Take 1 tablet (200 mg total) by mouth 3 (three) times daily as needed for pain., Disp: 10 tablet, Rfl: 0 .  Semaglutide (OZEMPIC, 0.25 OR 0.5 MG/DOSE, [place]), Inject 25 mg into the skin., Disp: , Rfl:  .  sertraline (ZOLOFT) 100 MG tablet, Take 150 mg by mouth daily., Disp: , Rfl:  .  verapamil (CALAN-SR) 180 MG CR tablet, , Disp: , Rfl: 4 .  Verapamil HCl CR 200 MG CP24, Take 1 capsule by mouth daily., Disp: , Rfl:  .  VYVANSE 20 MG capsule, Take 20 mg by  mouth at bedtime., Disp: , Rfl:  .  zolpidem (AMBIEN) 10 MG tablet, Take 10 mg by mouth at bedtime as needed., Disp: , Rfl:  .  ciprofloxacin (CIPRO) 500 MG tablet, Take 1 tablet (500 mg total) by mouth 2 (two) times daily., Disp: 20 tablet, Rfl: 0 .  traMADol (ULTRAM) 50 MG tablet, Take 1 tablet (50 mg total) by mouth every 8 (eight) hours as needed for up to 5 days., Disp: 15 tablet, Rfl: 0   Allergies  Allergen Reactions  . Codeine Other (See Comments)  . Penicillins   . Prednisone Other (See Comments)    Makes her violent   . Sulfa Antibiotics Hives    ROS Review of Systems  Constitutional: Negative.   HENT: Negative.   Eyes: Negative.   Respiratory: Negative.   Cardiovascular: Negative.   Gastrointestinal: Negative.   Endocrine: Negative.   Genitourinary: Negative.   Musculoskeletal: Positive for back pain (left side; x 1 month).  Skin: Negative.  Negative for rash.  Allergic/Immunologic: Negative.   Neurological: Negative.   Hematological: Negative.   Psychiatric/Behavioral: Negative.   All other systems reviewed and are negative.     Objective:    Physical Exam Constitutional:      General: She is not in acute distress.    Appearance: Normal appearance. She is well-developed.     Interventions: Face mask in place.  HENT:     Head: Normocephalic and atraumatic.     Right Ear: Hearing normal.     Left Ear: Hearing normal.     Mouth/Throat:     Mouth: No oral lesions.  Eyes:     Conjunctiva/sclera: Conjunctivae normal.     Pupils: Pupils are equal, round, and reactive to light.  Cardiovascular:     Rate and Rhythm: Normal rate and regular rhythm.     Heart sounds: Normal heart sounds. No murmur heard.  No friction rub. No gallop.   Pulmonary:     Effort: Pulmonary effort is normal.     Breath sounds: Normal breath sounds. No wheezing, rhonchi or rales.  Abdominal:     General: Bowel sounds are normal.     Palpations: Abdomen is soft. There is no  hepatomegaly, splenomegaly or mass.     Tenderness: There is abdominal tenderness (Right mid Quadrant and RLQ; tenderness with palpation and percussion).  Musculoskeletal:        General: No tenderness. Normal range of motion.  Cervical back: Normal range of motion and neck supple.     Right lower leg: No edema.     Left lower leg: No edema.  Lymphadenopathy:     Cervical: No cervical adenopathy.     Right cervical: No superficial cervical adenopathy. Skin:    General: Skin is warm and dry.     Findings: No bruising, erythema, lesion or rash.     Comments: Varicose veins  Neurological:     Mental Status: She is alert and oriented to person, place, and time.  Psychiatric:        Behavior: Behavior normal.        Thought Content: Thought content normal.        Judgment: Judgment normal.     BP (!) 166/94   Pulse 90   Ht 5' (1.524 m)   Wt 205 lb 14.4 oz (93.4 kg)   BMI 40.21 kg/m  Wt Readings from Last 3 Encounters:  04/13/20 205 lb 14.4 oz (93.4 kg)  03/27/20 208 lb 9.6 oz (94.6 kg)  03/21/20 208 lb (94.3 kg)     Health Maintenance Due  Topic Date Due  . Hepatitis C Screening  Never done  . FOOT EXAM  Never done  . OPHTHALMOLOGY EXAM  Never done  . COVID-19 Vaccine (1) Never done  . TETANUS/TDAP  Never done  . COLONOSCOPY  Never done  . DEXA SCAN  Never done  . PNA vac Low Risk Adult (1 of 2 - PCV13) Never done  . HEMOGLOBIN A1C  11/22/2018  . URINE MICROALBUMIN  03/17/2019    There are no preventive care reminders to display for this patient.  Lab Results  Component Value Date   TSH 1.85 03/27/2020   Lab Results  Component Value Date   WBC 6.0 03/27/2020   HGB 13.1 03/27/2020   HCT 42.2 03/27/2020   MCV 81.6 03/27/2020   PLT 189 03/27/2020   Lab Results  Component Value Date   NA 138 03/27/2020   K 4.7 03/27/2020   CO2 20 03/27/2020   GLUCOSE 166 (H) 03/27/2020   BUN 14 03/27/2020   CREATININE 0.72 03/27/2020   BILITOT 0.3 03/27/2020    ALKPHOS 92 10/06/2017   AST 30 03/27/2020   ALT 28 03/27/2020   PROT 7.2 03/27/2020   ALBUMIN 3.7 10/06/2017   CALCIUM 9.5 03/27/2020   ANIONGAP 12 03/16/2018   Lab Results  Component Value Date   CHOL 281 (H) 03/16/2018   Lab Results  Component Value Date   HDL 68 03/16/2018   Lab Results  Component Value Date   LDLCALC 136 (H) 03/16/2018   Lab Results  Component Value Date   TRIG 383 (H) 03/16/2018   Lab Results  Component Value Date   CHOLHDL 4.1 03/16/2018   Lab Results  Component Value Date   HGBA1C 7.4 (H) 05/22/2018      Assessment & Plan:   Problem List Items Addressed This Visit      Cardiovascular and Mediastinum   Essential hypertension    Blood pressure is stable on meds.        Endocrine   Type 2 diabetes mellitus with other specified complication (HCC)    She will keep her blood sugar under control.  She checks her blood sugars twice a day at least      Relevant Medications   Semaglutide (OZEMPIC, 0.25 OR 0.5 MG/DOSE, [place])     Other   Obesity (BMI 35.0-39.9 without comorbidity)  Advised to lose weight.      Relevant Medications   Semaglutide (OZEMPIC, 0.25 OR 0.5 MG/DOSE, [place])   Diverticulitis - Primary    Patient was found to be tender in the right middle quadrant and right lower quadrant.  Bowel sounds are audible.  Urine was abnormal.  I started the patient on Cipro 500 mg p.o. twice a day and we gave her tramadol 50 mg p.o. twice a day for 5 days for the pain.  She will return back in 2 weeks for a checkup      Relevant Medications   ciprofloxacin (CIPRO) 500 MG tablet    Other Visit Diagnoses    Urinary tract infection without hematuria, site unspecified       Relevant Orders   POCT urinalysis dipstick (Completed)      Meds ordered this encounter  Medications  . traMADol (ULTRAM) 50 MG tablet    Sig: Take 1 tablet (50 mg total) by mouth every 8 (eight) hours as needed for up to 5 days.    Dispense:  15 tablet    Refill:   0  . ciprofloxacin (CIPRO) 500 MG tablet    Sig: Take 1 tablet (500 mg total) by mouth 2 (two) times daily.    Dispense:  20 tablet    Refill:  0    Follow-up: Return in about 2 weeks (around 04/27/2020).   By signing my name below, I, Pietro Cassis, attest that this documentation has been prepared under the direction and in the presence of Corky Downs, MD. Electronically Signed: Pietro Cassis, Medical Scribe. 04/13/20. 11:07 AM.  I personally performed the services described in this documentation, which was SCRIBED in my presence. The recorded information has been reviewed and considered accurate. It has been edited as necessary during review. Corky Downs, MD

## 2020-04-13 NOTE — Assessment & Plan Note (Signed)
She will keep her blood sugar under control.  She checks her blood sugars twice a day at least

## 2020-04-13 NOTE — Assessment & Plan Note (Signed)
Patient was found to be tender in the right middle quadrant and right lower quadrant.  Bowel sounds are audible.  Urine was abnormal.  I started the patient on Cipro 500 mg p.o. twice a day and we gave her tramadol 50 mg p.o. twice a day for 5 days for the pain.  She will return back in 2 weeks for a checkup

## 2020-04-13 NOTE — Assessment & Plan Note (Signed)
Blood pressure is stable on meds.

## 2020-04-13 NOTE — Assessment & Plan Note (Signed)
Advised to lose weight 

## 2020-04-27 ENCOUNTER — Encounter: Payer: Self-pay | Admitting: Internal Medicine

## 2020-04-27 ENCOUNTER — Other Ambulatory Visit: Payer: Self-pay

## 2020-04-27 ENCOUNTER — Ambulatory Visit (INDEPENDENT_AMBULATORY_CARE_PROVIDER_SITE_OTHER): Payer: 59 | Admitting: Internal Medicine

## 2020-04-27 VITALS — BP 171/97 | HR 103 | Ht 60.0 in | Wt 204.2 lb

## 2020-04-27 DIAGNOSIS — E6609 Other obesity due to excess calories: Secondary | ICD-10-CM

## 2020-04-27 DIAGNOSIS — E119 Type 2 diabetes mellitus without complications: Secondary | ICD-10-CM

## 2020-04-27 DIAGNOSIS — Z6839 Body mass index (BMI) 39.0-39.9, adult: Secondary | ICD-10-CM

## 2020-04-27 DIAGNOSIS — R300 Dysuria: Secondary | ICD-10-CM

## 2020-04-27 DIAGNOSIS — B379 Candidiasis, unspecified: Secondary | ICD-10-CM | POA: Insufficient documentation

## 2020-04-27 LAB — POCT URINALYSIS DIPSTICK
Bilirubin, UA: NEGATIVE
Blood, UA: NEGATIVE
Glucose, UA: NEGATIVE
Ketones, UA: NEGATIVE
Leukocytes, UA: NEGATIVE
Nitrite, UA: NEGATIVE
Protein, UA: NEGATIVE
Spec Grav, UA: >=1.03 — AB (ref 1.010–1.025)
Urobilinogen, UA: 0.2 E.U./dL
pH, UA: 6 (ref 5.0–8.0)

## 2020-04-27 MED ORDER — FLUCONAZOLE 150 MG PO TABS: 150.0000 mg | ORAL_TABLET | Freq: Every day | ORAL | 0 refills | Status: AC

## 2020-04-27 NOTE — Assessment & Plan Note (Signed)
Pt ozempic was increased to 0.5 once a week. Blood sugar today is 160. She will come back in 1 month for recheck

## 2020-04-27 NOTE — Progress Notes (Signed)
Established Patient Office Visit  SUBJECTIVE:  Subjective  Patient ID: April Singleton, female    DOB: [birth year]  Age: 69 y.o. MRN: [id number]  CC:  Chief Complaint  Patient presents with  . Urinary Tract Infection    Patient presents today for a 2 week follow up. Pt reports her sxs have not improved     HPI April Singleton is a 69 y.o. female presenting today for pt has been on ozempic for several weeks and her blood sugars are only going down slightly. It allowed her to lose appetite for a short amount of time but her appetite is back. Pt still has burning while urinating. She may attribute it to a fungal infection in the vagina. Pt also uses pyridium. She has gotten both doses of the COVID19 vaccines.   Past Medical History:  Diagnosis Date  . Asthma   . Diabetes mellitus without complication (HCC)   . Hypertension     Past Surgical History:  Procedure Laterality Date  . ABDOMINAL HYSTERECTOMY    . BREAST BIOPSY    . TONSILLECTOMY      Family History  Problem Relation Age of Onset  . Liver cancer Father   . Lung cancer Father     Social History   Socioeconomic History  . Marital status: Married    Spouse name: Not on file  . Number of children: Not on file  . Years of education: Not on file  . Highest education level: Not on file  Occupational History  . Not on file  Tobacco Use  . Smoking status: Never Smoker  . Smokeless tobacco: Never Used  Vaping Use  . Vaping Use: Never used  Substance and Sexual Activity  . Alcohol use: Yes    Alcohol/week: 0.0 standard drinks  . Drug use: Never  . Sexual activity: Yes    Birth control/protection: Surgical    Comment: Hysterectomy  Other Topics Concern  . Not on file  Social History Narrative  . Not on file   Social Determinants of Health   Financial Resource Strain:   . Difficulty of Paying Living Expenses:   Food Insecurity:   . Worried About Programme researcher, broadcasting/film/video in the Last Year:   . Barista  in the Last Year:   Transportation Needs:   . Freight forwarder (Medical):   Marland Kitchen Lack of Transportation (Non-Medical):   Physical Activity:   . Days of Exercise per Week:   . Minutes of Exercise per Session:   Stress:   . Feeling of Stress :   Social Connections:   . Frequency of Communication with Friends and Family:   . Frequency of Social Gatherings with Friends and Family:   . Attends Religious Services:   . Active Member of Clubs or Organizations:   . Attends Banker Meetings:   Marland Kitchen Marital Status:   Intimate Partner Violence:   . Fear of Current or Ex-Partner:   . Emotionally Abused:   Marland Kitchen Physically Abused:   . Sexually Abused:      Current Outpatient Medications:  .  celecoxib (CELEBREX) 200 MG capsule, Take 1 capsule by mouth daily., Disp: , Rfl:  .  cholestyramine (QUESTRAN) 4 GM/DOSE powder, SMARTSIG:1 Each By Mouth Twice Daily, Disp: , Rfl:  .  ciprofloxacin (CIPRO) 500 MG tablet, Take 1 tablet (500 mg total) by mouth 2 (two) times daily., Disp: 20 tablet, Rfl: 0 .  clotrimazole-betamethasone (LOTRISONE) cream, , Disp: ,  Rfl:  .  estradiol (ESTRACE) 1 MG tablet, Take 1 mg by mouth daily., Disp: , Rfl:  .  glucose blood (FREESTYLE LITE) test strip, Use as instructed, Disp: 100 each, Rfl: 12 .  Lancets (FREESTYLE) lancets, Use as instructed, Disp: 100 each, Rfl: 12 .  metFORMIN (GLUCOPHAGE-XR) 500 MG 24 hr tablet, Take 1 tablet (500 mg total) by mouth 2 (two) times daily., Disp: 60 tablet, Rfl: 6 .  milk thistle 175 MG tablet, Take 175 mg by mouth daily., Disp: , Rfl:  .  pantoprazole (PROTONIX) 40 MG tablet, Take 40 mg by mouth daily., Disp: , Rfl:  .  phenazopyridine (PYRIDIUM) 200 MG tablet, Take 1 tablet (200 mg total) by mouth 3 (three) times daily as needed for pain., Disp: 10 tablet, Rfl: 0 .  Semaglutide (OZEMPIC, 0.25 OR 0.5 MG/DOSE, [place]), Inject 25 mg into the skin., Disp: , Rfl:  .  sertraline (ZOLOFT) 100 MG tablet, Take 150 mg by mouth daily.,  Disp: , Rfl:  .  verapamil (CALAN-SR) 180 MG CR tablet, , Disp: , Rfl: 4 .  Verapamil HCl CR 200 MG CP24, Take 1 capsule by mouth daily., Disp: , Rfl:  .  VYVANSE 20 MG capsule, Take 20 mg by mouth at bedtime., Disp: , Rfl:  .  zolpidem (AMBIEN) 10 MG tablet, Take 10 mg by mouth at bedtime as needed., Disp: , Rfl:  .  fluconazole (DIFLUCAN) 150 MG tablet, Take 1 tablet (150 mg total) by mouth daily at 6 (six) AM for 4 doses., Disp: 4 tablet, Rfl: 0   Allergies  Allergen Reactions  . Codeine Other (See Comments)  . Penicillins   . Prednisone Other (See Comments)    Makes her violent   . Sulfa Antibiotics Hives    ROS Review of Systems  Constitutional: Negative.   HENT: Negative.   Eyes: Negative.   Respiratory: Negative.   Cardiovascular: Negative.   Gastrointestinal: Negative.   Endocrine: Negative.   Genitourinary: Positive for dysuria and vaginal pain.  Musculoskeletal: Negative.   Skin: Negative.   Allergic/Immunologic: Negative.   Neurological: Negative.   Hematological: Negative.   Psychiatric/Behavioral: Negative.   All other systems reviewed and are negative.    OBJECTIVE:    Physical Exam Vitals reviewed.  Constitutional:      Appearance: Normal appearance.  HENT:     Mouth/Throat:     Mouth: Mucous membranes are moist.  Eyes:     Pupils: Pupils are equal, round, and reactive to light.  Neck:     Vascular: No carotid bruit.  Cardiovascular:     Rate and Rhythm: Normal rate and regular rhythm.     Pulses: Normal pulses.     Heart sounds: Normal heart sounds.  Pulmonary:     Effort: Pulmonary effort is normal.     Breath sounds: Normal breath sounds.  Abdominal:     General: Bowel sounds are normal.     Palpations: Abdomen is soft. There is no hepatomegaly, splenomegaly or mass.     Tenderness: There is no abdominal tenderness.     Hernia: No hernia is present.  Musculoskeletal:        General: No tenderness.     Cervical back: Neck supple.      Right lower leg: No edema.     Left lower leg: No edema.  Skin:    Findings: No rash.  Neurological:     Mental Status: She is alert and oriented to person, place, and time.  Motor: No weakness.  Psychiatric:        Mood and Affect: Mood and affect normal.        Behavior: Behavior normal.     BP (!) 171/97   Pulse 103   Ht 5' (1.524 m)   Wt (!) 204 lb 3.2 oz (92.6 kg)   BMI 39.88 kg/m  Wt Readings from Last 3 Encounters:  04/27/20 (!) 204 lb 3.2 oz (92.6 kg)  04/13/20 205 lb 14.4 oz (93.4 kg)  03/27/20 208 lb 9.6 oz (94.6 kg)    Health Maintenance Due  Topic Date Due  . Hepatitis C Screening  Never done  . FOOT EXAM  Never done  . OPHTHALMOLOGY EXAM  Never done  . COVID-19 Vaccine (1) Never done  . TETANUS/TDAP  Never done  . COLONOSCOPY  Never done  . DEXA SCAN  Never done  . PNA vac Low Risk Adult (1 of 2 - PCV13) Never done  . HEMOGLOBIN A1C  11/22/2018  . URINE MICROALBUMIN  03/17/2019    There are no preventive care reminders to display for this patient.  CBC Latest Ref Rng & Units 03/27/2020 03/16/2018 10/06/2017  WBC 3.8 - 10.8 Thousand/uL 6.0 5.3 4.7  Hemoglobin 11.7 - 15.5 g/dL 69.6 29.5 28.4  Hematocrit 35 - 45 % 42.2 40.2 42.7  Platelets 140 - 400 Thousand/uL 189 177 149(L)   CMP Latest Ref Rng & Units 03/27/2020 07/17/2018 03/16/2018  Glucose 65 - 99 mg/dL 132(G) - 401(U)  BUN 7 - 25 mg/dL 14 - 10  Creatinine 2.72 - 0.99 mg/dL 5.36 6.44 0.34  Sodium 135 - 146 mmol/L 138 - 137  Potassium 3.5 - 5.3 mmol/L 4.7 - 4.0  Chloride 98 - 110 mmol/L 104 - 100(L)  CO2 20 - 32 mmol/L 20 - 25  Calcium 8.6 - 10.4 mg/dL 9.5 - 9.2  Total Protein 6.1 - 8.1 g/dL 7.2 - -  Total Bilirubin 0.2 - 1.2 mg/dL 0.3 - -  Alkaline Phos 38 - 126 U/L - - -  AST 10 - 35 U/L 30 - -  ALT 6 - 29 U/L 28 - -    Lab Results  Component Value Date   TSH 1.85 03/27/2020   Lab Results  Component Value Date   ALBUMIN 3.7 10/06/2017   ANIONGAP 12 03/16/2018   Lab Results    Component Value Date   CHOL 281 (H) 03/16/2018   CHOL 259 (H) 11/29/2015   HDL 68 03/16/2018   HDL 59 11/29/2015   LDLCALC 136 (H) 03/16/2018   LDLCALC 151 (H) 11/29/2015   CHOLHDL 4.1 03/16/2018   CHOLHDL 4.4 11/29/2015   Lab Results  Component Value Date   TRIG 383 (H) 03/16/2018   Lab Results  Component Value Date   HGBA1C 7.4 (H) 05/22/2018   HGBA1C 7.1 (H) 03/16/2018   HGBA1C 5.7 09/03/2012      ASSESSMENT & PLAN:   Problem List Items Addressed This Visit      Endocrine   Type 2 diabetes mellitus without complication, without long-term current use of insulin (HCC)    Pt ozempic was increased to 0.5 once a week. Blood sugar today is 160. She will come back in 1 month for recheck        Other   Burning with urination    Pt has dysurea and burning maturation. She was started on diflucan. She will be reffered to urologist if not better.       Relevant Orders  POCT urinalysis dipstick (Completed)   Class 2 obesity due to excess calories without serious comorbidity with body mass index (BMI) of 39.0 to 39.9 in adult    - I encouraged the patient to lose weight.  - I educated them on making healthy dietary choices including eating more fruits and vegetables and less fried foods. - I encouraged the patient to exercise more, and educated on the benefits of exercise including weight loss, diabetes management, and hypertension management.        Candidiasis - Primary    Pt was started on dyflucan 150 PO daily for 4 days      Relevant Medications   fluconazole (DIFLUCAN) 150 MG tablet      Meds ordered this encounter  Medications  . fluconazole (DIFLUCAN) 150 MG tablet    Sig: Take 1 tablet (150 mg total) by mouth daily at 6 (six) AM for 4 doses.    Dispense:  4 tablet    Refill:  0      Follow-up: Return in about 3 months (around 07/28/2020).    Dr. Woodroe ChenJaved Nevin [facility] [address], [place], [place] [id number]   By signing my  name below, I, Dorthula PerfectElaine Hinkel, attest that this documentation has been prepared under the direction and in the presence of Corky DownsMasoud, Hollis Oh, MD. Electronically Signed: Corky DownsJaved Dakiya Puopolo, MD 04/27/20, 4:59 PM   I personally performed the services described in this documentation, which was SCRIBED in my presence. The recorded information has been reviewed and considered accurate. It has been edited as necessary during review. Corky DownsJaved Elza Sortor, MD

## 2020-04-27 NOTE — Assessment & Plan Note (Signed)
-   I encouraged the patient to lose weight.  - I educated them on making healthy dietary choices including eating more fruits and vegetables and less fried foods. - I encouraged the patient to exercise more, and educated on the benefits of exercise including weight loss, diabetes management, and hypertension management.   

## 2020-04-27 NOTE — Assessment & Plan Note (Signed)
Pt was started on dyflucan 150 PO daily for 4 days

## 2020-04-27 NOTE — Assessment & Plan Note (Signed)
Pt has dysurea and burning maturation. She was started on diflucan. She will be reffered to urologist if not better.

## 2020-05-31 ENCOUNTER — Other Ambulatory Visit: Payer: Self-pay | Admitting: Internal Medicine

## 2020-06-01 ENCOUNTER — Other Ambulatory Visit: Payer: Self-pay

## 2020-07-05 ENCOUNTER — Other Ambulatory Visit: Payer: Self-pay | Admitting: Internal Medicine

## 2020-07-05 DIAGNOSIS — Z1231 Encounter for screening mammogram for malignant neoplasm of breast: Secondary | ICD-10-CM

## 2020-07-18 ENCOUNTER — Other Ambulatory Visit: Payer: Self-pay | Admitting: Internal Medicine

## 2020-07-19 ENCOUNTER — Other Ambulatory Visit: Payer: Self-pay | Admitting: Psychiatry

## 2020-07-19 DIAGNOSIS — F5081 Binge eating disorder: Secondary | ICD-10-CM | POA: Diagnosis not present

## 2020-07-19 DIAGNOSIS — G4700 Insomnia, unspecified: Secondary | ICD-10-CM | POA: Diagnosis not present

## 2020-07-19 DIAGNOSIS — F39 Unspecified mood [affective] disorder: Secondary | ICD-10-CM | POA: Diagnosis not present

## 2020-07-19 DIAGNOSIS — F4310 Post-traumatic stress disorder, unspecified: Secondary | ICD-10-CM | POA: Diagnosis not present

## 2020-07-24 ENCOUNTER — Other Ambulatory Visit: Payer: Self-pay

## 2020-07-24 ENCOUNTER — Other Ambulatory Visit: Payer: Self-pay | Admitting: Internal Medicine

## 2020-07-24 ENCOUNTER — Ambulatory Visit (INDEPENDENT_AMBULATORY_CARE_PROVIDER_SITE_OTHER): Payer: 59 | Admitting: Internal Medicine

## 2020-07-24 ENCOUNTER — Encounter: Payer: Self-pay | Admitting: Internal Medicine

## 2020-07-24 VITALS — BP 182/76 | HR 87 | Ht 60.0 in | Wt 196.9 lb

## 2020-07-24 DIAGNOSIS — E785 Hyperlipidemia, unspecified: Secondary | ICD-10-CM | POA: Diagnosis not present

## 2020-07-24 DIAGNOSIS — E119 Type 2 diabetes mellitus without complications: Secondary | ICD-10-CM | POA: Diagnosis not present

## 2020-07-24 DIAGNOSIS — E6609 Other obesity due to excess calories: Secondary | ICD-10-CM

## 2020-07-24 DIAGNOSIS — K7460 Unspecified cirrhosis of liver: Secondary | ICD-10-CM

## 2020-07-24 DIAGNOSIS — I10 Essential (primary) hypertension: Secondary | ICD-10-CM

## 2020-07-24 DIAGNOSIS — M1990 Unspecified osteoarthritis, unspecified site: Secondary | ICD-10-CM | POA: Diagnosis not present

## 2020-07-24 DIAGNOSIS — Z6839 Body mass index (BMI) 39.0-39.9, adult: Secondary | ICD-10-CM

## 2020-07-24 MED ORDER — ROSUVASTATIN CALCIUM 10 MG PO TABS: 10.0000 mg | ORAL_TABLET | Freq: Every day | ORAL | 3 refills | Status: DC

## 2020-07-24 MED ORDER — OZEMPIC (0.25 OR 0.5 MG/DOSE) 2 MG/1.5ML ~~LOC~~ SOPN: 0.5000 mg | PEN_INJECTOR | SUBCUTANEOUS | 3 refills | Status: DC

## 2020-07-24 MED ORDER — LOSARTAN POTASSIUM 25 MG PO TABS: 25.0000 mg | ORAL_TABLET | Freq: Every day | ORAL | 0 refills | Status: DC

## 2020-07-24 MED ORDER — MELOXICAM 7.5 MG PO TABS: 7.5000 mg | ORAL_TABLET | Freq: Every day | ORAL | 0 refills | Status: DC

## 2020-07-24 NOTE — Assessment & Plan Note (Signed)
-   The patient's blood sugar is under control on metformin. - The patient will continue the current treatment regimen.  - I encouraged the patient to regularly check blood sugar.  - I encouraged the patient to monitor diet. I encouraged the patient to eat low-carb and low-sugar to help prevent blood sugar spikes.  - I encouraged the patient to continue following their prescribed treatment plan for diabetes - I informed the patient to get help if blood sugar drops below 54mg/dL, or if suddenly have trouble thinking clearly or breathing.     

## 2020-07-24 NOTE — Progress Notes (Signed)
Established Patient Office Visit  Subjective:  Patient ID: April Singleton, female    DOB: 1951-04-26  Age: 69 y.o. MRN: [id number]  CC:  Chief Complaint  Patient presents with  . Arthritis    wants to discuss celebrex     Arthritis Presents for follow-up visit. She complains of pain and joint swelling. Affected locations include the neck, right hip and left hip. Her pain is at a severity of 3/10. Associated symptoms include fatigue. Pertinent negatives include no diarrhea, dry eyes, dry mouth, dysuria, fever, rash, Raynaud's syndrome, uveitis or weight loss. Compliance with medications is 76-100%. Side effects of treatment include headaches and joint pain.    April Singleton presents for  rg  check  Past Medical History:  Diagnosis Date  . Asthma   . Diabetes mellitus without complication (HCC)   . Hypertension     Past Surgical History:  Procedure Laterality Date  . ABDOMINAL HYSTERECTOMY    . BREAST BIOPSY    . TONSILLECTOMY      Family History  Problem Relation Age of Onset  . Liver cancer Father   . Lung cancer Father     Social History   Socioeconomic History  . Marital status: Married    Spouse name: Not on file  . Number of children: Not on file  . Years of education: Not on file  . Highest education level: Not on file  Occupational History  . Not on file  Tobacco Use  . Smoking status: Never Smoker  . Smokeless tobacco: Never Used  Vaping Use  . Vaping Use: Never used  Substance and Sexual Activity  . Alcohol use: Yes    Alcohol/week: 0.0 standard drinks  . Drug use: Never  . Sexual activity: Yes    Birth control/protection: Surgical    Comment: Hysterectomy  Other Topics Concern  . Not on file  Social History Narrative  . Not on file   Social Determinants of Health   Financial Resource Strain:   . Difficulty of Paying Living Expenses: Not on file  Food Insecurity:   . Worried About Programme researcher, broadcasting/film/video in the Last Year: Not on file  .  Ran Out of Food in the Last Year: Not on file  Transportation Needs:   . Lack of Transportation (Medical): Not on file  . Lack of Transportation (Non-Medical): Not on file  Physical Activity:   . Days of Exercise per Week: Not on file  . Minutes of Exercise per Session: Not on file  Stress:   . Feeling of Stress : Not on file  Social Connections:   . Frequency of Communication with Friends and Family: Not on file  . Frequency of Social Gatherings with Friends and Family: Not on file  . Attends Religious Services: Not on file  . Active Member of Clubs or Organizations: Not on file  . Attends Banker Meetings: Not on file  . Marital Status: Not on file  Intimate Partner Violence:   . Fear of Current or Ex-Partner: Not on file  . Emotionally Abused: Not on file  . Physically Abused: Not on file  . Sexually Abused: Not on file     Current Outpatient Medications:  .  celecoxib (CELEBREX) 200 MG capsule, Take 1 capsule by mouth daily., Disp: , Rfl:  .  cholestyramine (QUESTRAN) 4 GM/DOSE powder, SMARTSIG:1 Each By Mouth Twice Daily, Disp: , Rfl:  .  clotrimazole-betamethasone (LOTRISONE) cream, APPLY TO AFFECTED AREA THREE TIMES  DAILY, Disp: 90 g, Rfl: 2 .  estradiol (ESTRACE) 1 MG tablet, Take 1 mg by mouth daily., Disp: , Rfl:  .  glucose blood (FREESTYLE LITE) test strip, Use as instructed, Disp: 100 each, Rfl: 12 .  Lancets (FREESTYLE) lancets, Use as instructed, Disp: 100 each, Rfl: 12 .  metFORMIN (GLUCOPHAGE-XR) 500 MG 24 hr tablet, Take 1 tablet (500 mg total) by mouth 2 (two) times daily., Disp: 60 tablet, Rfl: 6 .  milk thistle 175 MG tablet, Take 175 mg by mouth daily., Disp: , Rfl:  .  pantoprazole (PROTONIX) 40 MG tablet, Take 40 mg by mouth daily., Disp: , Rfl:  .  phenazopyridine (PYRIDIUM) 200 MG tablet, Take 1 tablet (200 mg total) by mouth 3 (three) times daily as needed for pain., Disp: 10 tablet, Rfl: 0 .  Semaglutide (OZEMPIC, 0.25 OR 0.5 MG/DOSE, [place]),  Inject 25 mg into the skin., Disp: , Rfl:  .  sertraline (ZOLOFT) 100 MG tablet, Take 150 mg by mouth daily., Disp: , Rfl:  .  verapamil (CALAN-SR) 180 MG CR tablet, , Disp: , Rfl: 4 .  Verapamil HCl CR 200 MG CP24, TAKE 1 CAPSULE BY MOUTH ONCE DAILY, Disp: 90 capsule, Rfl: 4 .  VYVANSE 20 MG capsule, Take 20 mg by mouth at bedtime., Disp: , Rfl:  .  zolpidem (AMBIEN) 10 MG tablet, Take 10 mg by mouth at bedtime as needed., Disp: , Rfl:  .  meloxicam (MOBIC) 7.5 MG tablet, Take 1 tablet (7.5 mg total) by mouth daily., Disp: 30 tablet, Rfl: 0 .  rosuvastatin (CRESTOR) 10 MG tablet, Take 1 tablet (10 mg total) by mouth daily., Disp: 90 tablet, Rfl: 3   Allergies  Allergen Reactions  . Codeine Other (See Comments)  . Penicillins   . Prednisone Other (See Comments)    Makes her violent   . Sulfa Antibiotics Hives    ROS Review of Systems  Constitutional: Positive for fatigue. Negative for fever and weight loss.  HENT: Negative.   Eyes: Negative.   Respiratory: Negative.   Cardiovascular: Negative.   Gastrointestinal: Negative.  Negative for diarrhea.  Endocrine: Negative.   Genitourinary: Negative.  Negative for dysuria.  Musculoskeletal: Positive for arthritis and joint swelling.  Skin: Negative.  Negative for rash.  Allergic/Immunologic: Negative.   Neurological: Negative.   Hematological: Negative.   Psychiatric/Behavioral: Negative.   All other systems reviewed and are negative.     Objective:    Physical Exam Vitals reviewed.  Constitutional:      Appearance: Normal appearance.  HENT:     Mouth/Throat:     Mouth: Mucous membranes are moist.  Eyes:     Pupils: Pupils are equal, round, and reactive to light.  Neck:     Vascular: No carotid bruit.  Cardiovascular:     Rate and Rhythm: Normal rate and regular rhythm.     Pulses: Normal pulses.     Heart sounds: Normal heart sounds.  Pulmonary:     Effort: Pulmonary effort is normal.     Breath sounds: Normal  breath sounds.  Abdominal:     General: Bowel sounds are normal.     Palpations: Abdomen is soft. There is no hepatomegaly, splenomegaly or mass.     Tenderness: There is no abdominal tenderness.     Hernia: No hernia is present.  Musculoskeletal:        General: No tenderness.     Cervical back: Neck supple.     Right lower leg:  No edema.     Left lower leg: No edema.  Skin:    Findings: No rash.  Neurological:     Mental Status: She is alert and oriented to person, place, and time.     Motor: No weakness.  Psychiatric:        Mood and Affect: Mood and affect normal.        Behavior: Behavior normal.     BP (!) 182/76   Pulse 87   Ht 5' (1.524 m)   Wt 196 lb 14.4 oz (89.3 kg)   BMI 38.45 kg/m  Wt Readings from Last 3 Encounters:  07/24/20 196 lb 14.4 oz (89.3 kg)  04/27/20 (!) 204 lb 3.2 oz (92.6 kg)  04/13/20 205 lb 14.4 oz (93.4 kg)     Health Maintenance Due  Topic Date Due  . Hepatitis C Screening  Never done  . FOOT EXAM  Never done  . OPHTHALMOLOGY EXAM  Never done  . COVID-19 Vaccine (1) Never done  . TETANUS/TDAP  Never done  . COLONOSCOPY  Never done  . DEXA SCAN  Never done  . PNA vac Low Risk Adult (1 of 2 - PCV13) Never done  . HEMOGLOBIN A1C  11/22/2018  . URINE MICROALBUMIN  03/17/2019  . INFLUENZA VACCINE  Never done  . MAMMOGRAM  06/10/2020    There are no preventive care reminders to display for this patient.  Lab Results  Component Value Date   TSH 1.85 03/27/2020   Lab Results  Component Value Date   WBC 6.0 03/27/2020   HGB 13.1 03/27/2020   HCT 42.2 03/27/2020   MCV 81.6 03/27/2020   PLT 189 03/27/2020   Lab Results  Component Value Date   NA 138 03/27/2020   K 4.7 03/27/2020   CO2 20 03/27/2020   GLUCOSE 166 (H) 03/27/2020   BUN 14 03/27/2020   CREATININE 0.72 03/27/2020   BILITOT 0.3 03/27/2020   ALKPHOS 92 10/06/2017   AST 30 03/27/2020   ALT 28 03/27/2020   PROT 7.2 03/27/2020   ALBUMIN 3.7 10/06/2017   CALCIUM  9.5 03/27/2020   ANIONGAP 12 03/16/2018   Lab Results  Component Value Date   CHOL 281 (H) 03/16/2018   Lab Results  Component Value Date   HDL 68 03/16/2018   Lab Results  Component Value Date   LDLCALC 136 (H) 03/16/2018   Lab Results  Component Value Date   TRIG 383 (H) 03/16/2018   Lab Results  Component Value Date   CHOLHDL 4.1 03/16/2018   Lab Results  Component Value Date   HGBA1C 7.4 (H) 05/22/2018      Assessment & Plan:   Problem List Items Addressed This Visit      Cardiovascular and Mediastinum   Essential hypertension    - Today, the patient's blood pressure is not well managed on verapamil. - The patient will change the current treatment regimen.  - I encouraged the patient to eat a low-sodium diet to help control blood pressure. - I encouraged the patient to live an active lifestyle and complete activities that increases heart rate to 85% target heart rate at least 5 times per week for one hour.          Relevant Medications   rosuvastatin (CRESTOR) 10 MG tablet     Digestive   Cirrhosis of liver without ascites (HCC)    Patient has been advised to lose weight.  She is under care of the gastroenterologist.  Endocrine   Type 2 diabetes mellitus without complication, without long-term current use of insulin (HCC)    - The patient's blood sugar is under control on metformin. - The patient will continue the current treatment regimen.  - I encouraged the patient to regularly check blood sugar.  - I encouraged the patient to monitor diet. I encouraged the patient to eat low-carb and low-sugar to help prevent blood sugar spikes.  - I encouraged the patient to continue following their prescribed treatment plan for diabetes - I informed the patient to get help if blood sugar drops below 54mg /dL, or if suddenly have trouble thinking clearly or breathing.          Relevant Medications   rosuvastatin (CRESTOR) 10 MG tablet      Musculoskeletal and Integument   Arthritis - Primary    Patient has arthritis of the finger hip and back.  I told her to try to work with the physical therapy in the hospital and do some exercises, try to lose some weight.  And take vitamin D 1000 units p.o. daily      Relevant Medications   meloxicam (MOBIC) 7.5 MG tablet     Other   Class 2 obesity due to excess calories without serious comorbidity with body mass index (BMI) of 39.0 to 39.9 in adult    - I encouraged the patient to lose weight.  - I educated them on making healthy dietary choices including eating more fruits and vegetables and less fried foods. - I encouraged the patient to exercise more, and educated on the benefits of exercise including weight loss, diabetes management, and hypertension management.        Dyslipidemia    - The patient's hyperlipidemia is stable , will add statin. - The patient will continue the current treatment regimen.  - I encouraged the patient to eat more vegetables and whole wheat, and to avoid fatty foods like whole milk, hard cheese, egg yolks, margarine, baked sweets, and fried foods.  - I encouraged the patient to live an active lifestyle and complete activities for 40 minutes at least three times per week.  - I instructed the patient to go to the ER if they begin having chest pain.       Relevant Medications   rosuvastatin (CRESTOR) 10 MG tablet      Meds ordered this encounter  Medications  . meloxicam (MOBIC) 7.5 MG tablet    Sig: Take 1 tablet (7.5 mg total) by mouth daily.    Dispense:  30 tablet    Refill:  0  . rosuvastatin (CRESTOR) 10 MG tablet    Sig: Take 1 tablet (10 mg total) by mouth daily.    Dispense:  90 tablet    Refill:  3    Follow-up: No follow-ups on file.    Corky DownsJaved Tyrene Nader, MD

## 2020-07-24 NOTE — Assessment & Plan Note (Signed)
Patient has been advised to lose weight.  She is under care of the gastroenterologist.

## 2020-07-24 NOTE — Assessment & Plan Note (Signed)
-   The patient's hyperlipidemia is stable , will add statin. - The patient will continue the current treatment regimen.  - I encouraged the patient to eat more vegetables and whole wheat, and to avoid fatty foods like whole milk, hard cheese, egg yolks, margarine, baked sweets, and fried foods.  - I encouraged the patient to live an active lifestyle and complete activities for 40 minutes at least three times per week.  - I instructed the patient to go to the ER if they begin having chest pain.

## 2020-07-24 NOTE — Assessment & Plan Note (Signed)
-   I encouraged the patient to lose weight.  - I educated them on making healthy dietary choices including eating more fruits and vegetables and less fried foods. - I encouraged the patient to exercise more, and educated on the benefits of exercise including weight loss, diabetes management, and hypertension management.   

## 2020-07-24 NOTE — Assessment & Plan Note (Signed)
-   Today, the patient's blood pressure is not well managed on verapamil. - The patient will change the current treatment regimen.  - I encouraged the patient to eat a low-sodium diet to help control blood pressure. - I encouraged the patient to live an active lifestyle and complete activities that increases heart rate to 85% target heart rate at least 5 times per week for one hour.

## 2020-07-24 NOTE — Assessment & Plan Note (Signed)
Patient has arthritis of the finger hip and back.  I told her to try to work with the physical therapy in the hospital and do some exercises, try to lose some weight.  And take vitamin D 1000 units p.o. daily

## 2020-08-03 ENCOUNTER — Other Ambulatory Visit: Payer: Self-pay

## 2020-08-03 ENCOUNTER — Ambulatory Visit
Admission: RE | Admit: 2020-08-03 | Discharge: 2020-08-03 | Disposition: A | Discharge status: Home / Self Care | Payer: 59 | Source: Ambulatory Visit | Attending: Internal Medicine | Admitting: Internal Medicine

## 2020-08-03 DIAGNOSIS — Z1231 Encounter for screening mammogram for malignant neoplasm of breast: Secondary | ICD-10-CM | POA: Insufficient documentation

## 2020-08-18 ENCOUNTER — Other Ambulatory Visit: Payer: Self-pay | Admitting: Gastroenterology

## 2020-08-23 ENCOUNTER — Other Ambulatory Visit: Payer: Self-pay | Admitting: Gastroenterology

## 2020-08-23 DIAGNOSIS — K7581 Nonalcoholic steatohepatitis (NASH): Secondary | ICD-10-CM

## 2020-08-23 DIAGNOSIS — K7460 Unspecified cirrhosis of liver: Secondary | ICD-10-CM | POA: Diagnosis not present

## 2020-08-30 ENCOUNTER — Other Ambulatory Visit: Payer: Self-pay | Admitting: Internal Medicine

## 2020-08-30 ENCOUNTER — Other Ambulatory Visit: Payer: Self-pay

## 2020-08-30 ENCOUNTER — Ambulatory Visit
Admission: RE | Admit: 2020-08-30 | Discharge: 2020-08-30 | Disposition: A | Discharge status: Home / Self Care | Payer: 59 | Source: Ambulatory Visit | Attending: Gastroenterology | Admitting: Gastroenterology

## 2020-08-30 DIAGNOSIS — K7689 Other specified diseases of liver: Secondary | ICD-10-CM | POA: Diagnosis not present

## 2020-08-30 DIAGNOSIS — K7581 Nonalcoholic steatohepatitis (NASH): Secondary | ICD-10-CM | POA: Diagnosis not present

## 2020-09-12 ENCOUNTER — Other Ambulatory Visit: Payer: Self-pay | Admitting: Internal Medicine

## 2020-09-12 ENCOUNTER — Other Ambulatory Visit: Payer: Self-pay | Admitting: *Deleted

## 2020-09-12 MED ORDER — ESTRADIOL 1 MG PO TABS
1.0000 mg | ORAL_TABLET | Freq: Every day | ORAL | 6 refills | Status: DC
Start: 2020-09-12 — End: 2020-09-12

## 2020-09-27 ENCOUNTER — Other Ambulatory Visit: Payer: Self-pay | Admitting: Internal Medicine

## 2020-10-02 ENCOUNTER — Other Ambulatory Visit: Payer: Self-pay | Admitting: Internal Medicine

## 2020-10-10 ENCOUNTER — Other Ambulatory Visit: Payer: Self-pay | Admitting: Family Medicine

## 2020-10-10 DIAGNOSIS — M25562 Pain in left knee: Secondary | ICD-10-CM | POA: Diagnosis not present

## 2020-10-10 DIAGNOSIS — M25462 Effusion, left knee: Secondary | ICD-10-CM | POA: Diagnosis not present

## 2020-10-10 DIAGNOSIS — M1712 Unilateral primary osteoarthritis, left knee: Secondary | ICD-10-CM | POA: Diagnosis not present

## 2020-10-10 DIAGNOSIS — R399 Unspecified symptoms and signs involving the genitourinary system: Secondary | ICD-10-CM | POA: Diagnosis not present

## 2020-10-10 DIAGNOSIS — M7989 Other specified soft tissue disorders: Secondary | ICD-10-CM | POA: Diagnosis not present

## 2020-10-13 ENCOUNTER — Other Ambulatory Visit: Payer: Self-pay | Admitting: Family Medicine

## 2020-10-17 ENCOUNTER — Other Ambulatory Visit: Payer: Self-pay | Admitting: Psychiatry

## 2020-10-17 DIAGNOSIS — G4700 Insomnia, unspecified: Secondary | ICD-10-CM | POA: Diagnosis not present

## 2020-10-17 DIAGNOSIS — F39 Unspecified mood [affective] disorder: Secondary | ICD-10-CM | POA: Diagnosis not present

## 2020-10-17 DIAGNOSIS — F5081 Binge eating disorder: Secondary | ICD-10-CM | POA: Diagnosis not present

## 2020-10-17 DIAGNOSIS — F4310 Post-traumatic stress disorder, unspecified: Secondary | ICD-10-CM | POA: Diagnosis not present

## 2020-10-18 ENCOUNTER — Other Ambulatory Visit: Payer: Self-pay | Admitting: Ophthalmology

## 2020-10-18 DIAGNOSIS — H26491 Other secondary cataract, right eye: Secondary | ICD-10-CM | POA: Diagnosis not present

## 2020-10-24 ENCOUNTER — Ambulatory Visit: Payer: 59 | Admitting: Internal Medicine

## 2020-10-26 ENCOUNTER — Other Ambulatory Visit: Payer: Self-pay | Admitting: Family Medicine

## 2020-10-26 ENCOUNTER — Other Ambulatory Visit: Payer: Self-pay

## 2020-10-26 ENCOUNTER — Encounter: Payer: Self-pay | Admitting: Family Medicine

## 2020-10-26 ENCOUNTER — Ambulatory Visit (INDEPENDENT_AMBULATORY_CARE_PROVIDER_SITE_OTHER): Payer: 59 | Admitting: Family Medicine

## 2020-10-26 VITALS — BP 164/80 | HR 88 | Ht 60.0 in | Wt 195.1 lb

## 2020-10-26 DIAGNOSIS — E1165 Type 2 diabetes mellitus with hyperglycemia: Secondary | ICD-10-CM | POA: Diagnosis not present

## 2020-10-26 DIAGNOSIS — I10 Essential (primary) hypertension: Secondary | ICD-10-CM | POA: Diagnosis not present

## 2020-10-26 DIAGNOSIS — M1711 Unilateral primary osteoarthritis, right knee: Secondary | ICD-10-CM | POA: Diagnosis not present

## 2020-10-26 DIAGNOSIS — E119 Type 2 diabetes mellitus without complications: Secondary | ICD-10-CM

## 2020-10-26 LAB — POCT GLYCOSYLATED HEMOGLOBIN (HGB A1C): HbA1c POC (<> result, manual entry): 7.2 % (ref 4.0–5.6)

## 2020-10-26 LAB — GLUCOSE, POCT (MANUAL RESULT ENTRY): POC Glucose: 251 mg/dl — AB (ref 70–99)

## 2020-10-26 MED ORDER — FREESTYLE LITE TEST VI STRP
ORAL_STRIP | 12 refills | Status: DC
Start: 2020-10-26 — End: 2020-10-26

## 2020-10-26 MED ORDER — MELOXICAM 15 MG PO TABS: 15.0000 mg | ORAL_TABLET | Freq: Every day | ORAL | 0 refills | Status: DC

## 2020-10-26 NOTE — Progress Notes (Signed)
Established Patient Office Visit  SUBJECTIVE:  Subjective  Patient ID: April Singleton, female    DOB: May 22, 1951  Age: 70 y.o. MRN: [id number]  CC:  Chief Complaint  Patient presents with  . Diabetes    Patient is here for 3 month diabetes check and blood pressure follow up    HPI April Singleton is a 70 y.o. female presenting today for     Past Medical History:  Diagnosis Date  . Asthma   . Diabetes mellitus without complication (HCC)   . Hypertension     Past Surgical History:  Procedure Laterality Date  . ABDOMINAL HYSTERECTOMY    . BREAST BIOPSY    . TONSILLECTOMY      Family History  Problem Relation Age of Onset  . Liver cancer Father   . Lung cancer Father     Social History   Socioeconomic History  . Marital status: Married    Spouse name: Not on file  . Number of children: Not on file  . Years of education: Not on file  . Highest education level: Not on file  Occupational History  . Not on file  Tobacco Use  . Smoking status: Never Smoker  . Smokeless tobacco: Never Used  Vaping Use  . Vaping Use: Never used  Substance and Sexual Activity  . Alcohol use: Yes    Alcohol/week: 0.0 standard drinks  . Drug use: Never  . Sexual activity: Yes    Birth control/protection: Surgical    Comment: Hysterectomy  Other Topics Concern  . Not on file  Social History Narrative  . Not on file   Social Determinants of Health   Financial Resource Strain: Not on file  Food Insecurity: Not on file  Transportation Needs: Not on file  Physical Activity: Not on file  Stress: Not on file  Social Connections: Not on file  Intimate Partner Violence: Not on file     Current Outpatient Medications:  .  cholestyramine (QUESTRAN) 4 GM/DOSE powder, SMARTSIG:1 Each By Mouth Twice Daily, Disp: , Rfl:  .  clotrimazole-betamethasone (LOTRISONE) cream, APPLY TO AFFECTED AREA THREE TIMES DAILY, Disp: 90 g, Rfl: 2 .  estradiol (ESTRACE) 1 MG tablet, Take 1 tablet  (1 mg total) by mouth daily., Disp: 30 tablet, Rfl: 6 .  Lancets (FREESTYLE) lancets, Use as instructed, Disp: 100 each, Rfl: 12 .  losartan (COZAAR) 25 MG tablet, Take 1 tablet (25 mg total) by mouth daily., Disp: 30 tablet, Rfl: 0 .  losartan-hydrochlorothiazide (HYZAAR) 50-12.5 MG tablet, TAKE 1 TABLET BY MOUTH DAILY, Disp: 90 tablet, Rfl: 3 .  meloxicam (MOBIC) 15 MG tablet, Take 1 tablet (15 mg total) by mouth daily., Disp: 30 tablet, Rfl: 0 .  meloxicam (MOBIC) 7.5 MG tablet, TAKE 1 TABLET (7.5 MG TOTAL) BY MOUTH DAILY., Disp: 30 tablet, Rfl: 0 .  metFORMIN (GLUCOPHAGE-XR) 500 MG 24 hr tablet, TAKE 1 TABLET BY MOUTH 2 (TWO) TIMES DAILY., Disp: 60 tablet, Rfl: 6 .  milk thistle 175 MG tablet, Take 175 mg by mouth daily., Disp: , Rfl:  .  pantoprazole (PROTONIX) 40 MG tablet, Take 40 mg by mouth daily., Disp: , Rfl:  .  phenazopyridine (PYRIDIUM) 200 MG tablet, Take 1 tablet (200 mg total) by mouth 3 (three) times daily as needed for pain., Disp: 10 tablet, Rfl: 0 .  rosuvastatin (CRESTOR) 10 MG tablet, Take 1 tablet (10 mg total) by mouth daily., Disp: 90 tablet, Rfl: 3 .  rosuvastatin (CRESTOR) 10 MG  tablet, Take 1 tablet (10 mg total) by mouth daily., Disp: 90 tablet, Rfl: 3 .  Semaglutide,0.25 or 0.5MG /DOS, (OZEMPIC, 0.25 OR 0.5 MG/DOSE,) 2 MG/1.5ML SOPN, Inject 0.5 mg into the skin once a week., Disp: 1.5 mL, Rfl: 3 .  sertraline (ZOLOFT) 100 MG tablet, Take 150 mg by mouth daily., Disp: , Rfl:  .  verapamil (CALAN-SR) 180 MG CR tablet, , Disp: , Rfl: 4 .  Verapamil HCl CR 200 MG CP24, TAKE 1 CAPSULE BY MOUTH ONCE DAILY, Disp: 90 capsule, Rfl: 4 .  VYVANSE 20 MG capsule, Take 20 mg by mouth at bedtime., Disp: , Rfl:  .  zolpidem (AMBIEN) 10 MG tablet, Take 10 mg by mouth at bedtime as needed., Disp: , Rfl:  .  glucose blood (FREESTYLE LITE) test strip, Use as instructed, Disp: 100 each, Rfl: 12   Allergies  Allergen Reactions  . Codeine Other (See Comments)  . Penicillins   .  Prednisone Other (See Comments)    Makes her violent   . Sulfa Antibiotics Hives    ROS Review of Systems  Constitutional: Negative.   HENT: Negative.   Respiratory: Negative.   Cardiovascular: Negative.   Gastrointestinal: Negative.   Genitourinary: Negative.   Musculoskeletal: Positive for arthralgias.  Neurological: Negative.   Hematological: Negative.   Psychiatric/Behavioral: Negative.   All other systems reviewed and are negative.    OBJECTIVE:    Physical Exam Vitals and nursing note reviewed.  Constitutional:      Appearance: She is obese.  HENT:     Head: Normocephalic.     Nose: Nose normal.     Mouth/Throat:     Mouth: Mucous membranes are moist.  Eyes:     Pupils: Pupils are equal, round, and reactive to light.  Cardiovascular:     Rate and Rhythm: Normal rate.  Pulmonary:     Effort: Pulmonary effort is normal.  Abdominal:     General: Abdomen is flat.  Musculoskeletal:     Cervical back: Normal range of motion.  Skin:    General: Skin is warm.     Capillary Refill: Capillary refill takes less than 2 seconds.     Comments: R knee decreased ROM  Neurological:     Mental Status: She is alert.  Psychiatric:        Mood and Affect: Mood normal.     BP (!) 164/80   Pulse 88   Ht 5' (1.524 m)   Wt 195 lb 1.6 oz (88.5 kg)   BMI 38.10 kg/m  Wt Readings from Last 3 Encounters:  10/26/20 195 lb 1.6 oz (88.5 kg)  07/24/20 196 lb 14.4 oz (89.3 kg)  04/27/20 (!) 204 lb 3.2 oz (92.6 kg)    Health Maintenance Due  Topic Date Due  . Hepatitis C Screening  Never done  . COVID-19 Vaccine (1) Never done  . FOOT EXAM  Never done  . OPHTHALMOLOGY EXAM  Never done  . TETANUS/TDAP  Never done  . COLONOSCOPY (Pts 45-17yrs Insurance coverage will need to be confirmed)  Never done  . DEXA SCAN  Never done  . PNA vac Low Risk Adult (1 of 2 - PCV13) Never done  . INFLUENZA VACCINE  Never done    There are no preventive care reminders to display for this  patient.  CBC Latest Ref Rng & Units 03/27/2020 03/16/2018 10/06/2017  WBC 3.8 - 10.8 Thousand/uL 6.0 5.3 4.7  Hemoglobin 11.7 - 15.5 g/dL 93.8 18.2 14.4  Hematocrit 35.0 - 45.0 % 42.2 40.2 42.7  Platelets 140 - 400 Thousand/uL 189 177 149(L)   CMP Latest Ref Rng & Units 03/27/2020 07/17/2018 03/16/2018  Glucose 65 - 99 mg/dL 981(X) - 914(N)  BUN 7 - 25 mg/dL 14 - 10  Creatinine 8.29 - 0.99 mg/dL 5.62 1.30 8.65  Sodium 135 - 146 mmol/L 138 - 137  Potassium 3.5 - 5.3 mmol/L 4.7 - 4.0  Chloride 98 - 110 mmol/L 104 - 100(L)  CO2 20 - 32 mmol/L 20 - 25  Calcium 8.6 - 10.4 mg/dL 9.5 - 9.2  Total Protein 6.1 - 8.1 g/dL 7.2 - -  Total Bilirubin 0.2 - 1.2 mg/dL 0.3 - -  Alkaline Phos 38 - 126 U/L - - -  AST 10 - 35 U/L 30 - -  ALT 6 - 29 U/L 28 - -    Lab Results  Component Value Date   TSH 1.85 03/27/2020   Lab Results  Component Value Date   ALBUMIN 3.7 10/06/2017   ANIONGAP 12 03/16/2018   Lab Results  Component Value Date   CHOL 281 (H) 03/16/2018   CHOL 259 (H) 11/29/2015   HDL 68 03/16/2018   HDL 59 11/29/2015   LDLCALC 136 (H) 03/16/2018   LDLCALC 151 (H) 11/29/2015   CHOLHDL 4.1 03/16/2018   CHOLHDL 4.4 11/29/2015   Lab Results  Component Value Date   TRIG 383 (H) 03/16/2018   Lab Results  Component Value Date   HGBA1C 7.2 10/26/2020   HGBA1C 7.4 (H) 05/22/2018   HGBA1C 7.1 (H) 03/16/2018      ASSESSMENT & PLAN:   Problem List Items Addressed This Visit      Cardiovascular and Mediastinum   Essential hypertension    Patient's blood pressure is not within the desired range.  An office visit is recommended. Medication side effects include: no side effects noted Continue current treatment regimen. Continue current medications.   Taking all medication, NO CP or SOB reported. She thinks it may have been elevated due to stress.          Endocrine   Type 2 diabetes mellitus with hyperglycemia, without long-term current use of insulin (HCC) - Primary     Diabetes mellitus Type II, under fair control.. Discussed general issues about diabetes pathophysiology and management. Addressed ADA diet. Discussed foot care.  Diet is poor, she frequently eats peanut butter and toast with cereal, discussed the carbohydrate role in DM. She will try and work on her diet.           Meds ordered this encounter  Medications  . glucose blood (FREESTYLE LITE) test strip    Sig: Use as instructed    Dispense:  100 each    Refill:  12  . meloxicam (MOBIC) 15 MG tablet    Sig: Take 1 tablet (15 mg total) by mouth daily.    Dispense:  30 tablet    Refill:  0      Follow-up: No follow-ups on file.    Irish Lack, FNP [facility] [address] [place], [place] [address]

## 2020-10-26 NOTE — Assessment & Plan Note (Signed)
Patient's blood pressure is not within the desired range.  An office visit is recommended. Medication side effects include: no side effects noted Continue current treatment regimen. Continue current medications.   Taking all medication, NO CP or SOB reported. She thinks it may have been elevated due to stress.

## 2020-10-26 NOTE — Assessment & Plan Note (Signed)
Diabetes mellitus Type II, under fair control.. Discussed general issues about diabetes pathophysiology and management. Addressed ADA diet. Discussed foot care.  Diet is poor, she frequently eats peanut butter and toast with cereal, discussed the carbohydrate role in DM. She will try and work on her diet.

## 2020-10-26 NOTE — Assessment & Plan Note (Signed)
She has seen a walk in provider for R knee pain, XRAY showed significant OA. Plan- Increasing Meloxicam to 15 mg daily, her GFR is over 60

## 2020-11-06 DIAGNOSIS — H35372 Puckering of macula, left eye: Secondary | ICD-10-CM | POA: Diagnosis not present

## 2020-11-15 DIAGNOSIS — R197 Diarrhea, unspecified: Secondary | ICD-10-CM | POA: Diagnosis not present

## 2020-11-17 ENCOUNTER — Other Ambulatory Visit: Payer: Self-pay

## 2020-11-17 ENCOUNTER — Other Ambulatory Visit: Payer: Self-pay | Admitting: Internal Medicine

## 2020-11-17 MED ORDER — OZEMPIC (0.25 OR 0.5 MG/DOSE) 2 MG/1.5ML ~~LOC~~ SOPN
0.5000 mg | PEN_INJECTOR | SUBCUTANEOUS | 2 refills | Status: DC
Start: 2020-11-17 — End: 2020-11-17

## 2020-11-21 LAB — HM DIABETES EYE EXAM

## 2020-12-26 DIAGNOSIS — M542 Cervicalgia: Secondary | ICD-10-CM | POA: Diagnosis not present

## 2020-12-26 DIAGNOSIS — G8929 Other chronic pain: Secondary | ICD-10-CM | POA: Diagnosis not present

## 2020-12-26 DIAGNOSIS — M79671 Pain in right foot: Secondary | ICD-10-CM | POA: Diagnosis not present

## 2020-12-26 DIAGNOSIS — M8949 Other hypertrophic osteoarthropathy, multiple sites: Secondary | ICD-10-CM | POA: Diagnosis not present

## 2020-12-26 DIAGNOSIS — M25562 Pain in left knee: Secondary | ICD-10-CM | POA: Diagnosis not present

## 2020-12-28 ENCOUNTER — Other Ambulatory Visit: Payer: Self-pay | Admitting: Rheumatology

## 2020-12-30 ENCOUNTER — Other Ambulatory Visit: Payer: Self-pay

## 2021-01-02 ENCOUNTER — Other Ambulatory Visit: Payer: Self-pay

## 2021-01-02 MED FILL — Estradiol Tab 1 MG: ORAL | 90 days supply | Qty: 90 | Fill #0 | Status: AC

## 2021-01-02 MED FILL — Hydrochlorothiazide Tab 12.5 MG: ORAL | 90 days supply | Qty: 90 | Fill #0 | Status: AC

## 2021-01-02 MED FILL — Metformin HCl Tab ER 24HR 500 MG: ORAL | 90 days supply | Qty: 180 | Fill #0 | Status: AC

## 2021-01-02 MED FILL — Losartan Potassium Tab 50 MG: ORAL | 90 days supply | Qty: 90 | Fill #0 | Status: AC

## 2021-01-08 ENCOUNTER — Other Ambulatory Visit: Payer: Self-pay | Admitting: *Deleted

## 2021-01-08 DIAGNOSIS — U071 COVID-19: Secondary | ICD-10-CM

## 2021-01-10 ENCOUNTER — Other Ambulatory Visit: Payer: Self-pay | Admitting: Adult Health

## 2021-01-10 ENCOUNTER — Ambulatory Visit (HOSPITAL_COMMUNITY)
Admission: RE | Admit: 2021-01-10 | Discharge: 2021-01-10 | Disposition: A | Discharge status: Home / Self Care | Payer: 59 | Source: Ambulatory Visit | Attending: Pulmonary Disease | Admitting: Pulmonary Disease

## 2021-01-10 DIAGNOSIS — U071 COVID-19: Secondary | ICD-10-CM

## 2021-01-10 MED ORDER — BEBTELOVIMAB 175 MG/2 ML IV (EUA)
175.0000 mg | Freq: Once | INTRAMUSCULAR | Status: AC
  Administered 2021-01-10: 175 mg via INTRAVENOUS

## 2021-01-10 MED ORDER — DIPHENHYDRAMINE HCL 50 MG/ML IJ SOLN: 50.0000 mg | Freq: Once | INTRAMUSCULAR | Status: DC | PRN

## 2021-01-10 MED ORDER — METHYLPREDNISOLONE SODIUM SUCC 125 MG IJ SOLR: 125.0000 mg | Freq: Once | INTRAMUSCULAR | Status: DC | PRN

## 2021-01-10 MED ORDER — SODIUM CHLORIDE 0.9 % IV SOLN: INTRAVENOUS | Status: DC | PRN

## 2021-01-10 MED ORDER — ALBUTEROL SULFATE HFA 108 (90 BASE) MCG/ACT IN AERS: 2.0000 | INHALATION_SPRAY | Freq: Once | RESPIRATORY_TRACT | Status: DC | PRN

## 2021-01-10 MED ORDER — EPINEPHRINE 0.3 MG/0.3ML IJ SOAJ: 0.3000 mg | Freq: Once | INTRAMUSCULAR | Status: DC | PRN

## 2021-01-10 MED ORDER — FAMOTIDINE IN NACL 20-0.9 MG/50ML-% IV SOLN: 20.0000 mg | Freq: Once | INTRAVENOUS | Status: DC | PRN

## 2021-01-10 NOTE — Progress Notes (Signed)
I connected by phone with April Singleton on 01/10/2021 at 2:12 PM to discuss the potential use of a new treatment for mild to moderate COVID-19 viral infection in non-hospitalized patients.  This patient is a 70 y.o. female that meets the FDA criteria for Emergency Use Authorization of COVID monoclonal antibody bebtelovimab.  Has a (+) direct SARS-CoV-2 viral test result  Has mild or moderate COVID-19   Is NOT hospitalized due to COVID-19  Is within 10 days of symptom onset  Has at least one of the high risk factor(s) for progression to severe COVID-19 and/or hospitalization as defined in EUA.  Specific high risk criteria : Older age (>/= 69 yo) and BMI > 25   I have spoken and communicated the following to the patient or parent/caregiver regarding COVID monoclonal antibody treatment:  1. FDA has authorized the emergency use for the treatment of mild to moderate COVID-19 in adults and pediatric patients with positive results of direct SARS-CoV-2 viral testing who are 7 years of age and older weighing at least 40 kg, and who are at high risk for progressing to severe COVID-19 and/or hospitalization.  2. The significant known and potential risks and benefits of COVID monoclonal antibody, and the extent to which such potential risks and benefits are unknown.  3. Information on available alternative treatments and the risks and benefits of those alternatives, including clinical trials.  4. Patients treated with COVID monoclonal antibody should continue to self-isolate and use infection control measures (e.g., wear mask, isolate, social distance, avoid sharing personal items, clean and disinfect "high touch" surfaces, and frequent handwashing) according to CDC guidelines.   5. [name] has the option to accept or refuse COVID monoclonal antibody treatment.  After reviewing this information with the patient, the patient has agreed to receive one of the available covid  19 monoclonal antibodies and will be provided an appropriate fact sheet prior to infusion. Noreene Filbert, NP 01/10/2021 2:12 PM

## 2021-01-10 NOTE — Discharge Instructions (Signed)
10 Things You Can Do to Manage Your COVID-19 Symptoms at Home If you have possible or confirmed COVID-19: 1. Stay home except to get medical care. 2. Monitor your symptoms carefully. If your symptoms get worse, call your healthcare provider immediately. 3. Get rest and stay hydrated. 4. If you have a medical appointment, call the healthcare provider ahead of time and tell them that you have or may have COVID-19. 5. For medical emergencies, call 911 and notify the dispatch personnel that you have or may have COVID-19. 6. Cover your cough and sneezes with a tissue or use the inside of your elbow. 7. Wash your hands often with soap and water for at least 20 seconds or clean your hands with an alcohol-based hand sanitizer that contains at least 60% alcohol. 8. As much as possible, stay in a specific room and away from other people in your home. Also, you should use a separate bathroom, if available. If you need to be around other people in or outside of the home, wear a mask. 9. Avoid sharing personal items with other people in your household, like dishes, towels, and bedding. 10. Clean all surfaces that are touched often, like counters, tabletops, and doorknobs. Use household cleaning sprays or wipes according to the label instructions. cdc.gov/coronavirus 04/14/2020 This information is not intended to replace advice given to you by your health care provider. Make sure you discuss any questions you have with your health care provider. Document Revised: 07/31/2020 Document Reviewed: 07/31/2020 Elsevier Patient Education  2021 Elsevier Inc.  What types of side effects do monoclonal antibody drugs cause?  Common side effects  In general, the more common side effects caused by monoclonal antibody drugs include: . Allergic reactions, such as hives or itching . Flu-like signs and symptoms, including chills, fatigue, fever, and muscle aches and pains . Nausea, vomiting . Diarrhea . Skin  rashes . Low blood pressure   The CDC is recommending patients who receive monoclonal antibody treatments wait at least 90 days before being vaccinated.  Currently, there are no data on the safety and efficacy of mRNA COVID-19 vaccines in persons who received monoclonal antibodies or convalescent plasma as part of COVID-19 treatment. Based on the estimated half-life of such therapies as well as evidence suggesting that reinfection is uncommon in the 90 days after initial infection, vaccination should be deferred for at least 90 days, as a precautionary measure until additional information becomes available, to avoid interference of the antibody treatment with vaccine-induced immune responses.   If someone you know is interested in receiving treatment please have them contact their MD for a referral or visit [email or web address]    

## 2021-01-10 NOTE — Progress Notes (Addendum)
Patient reviewed Fact Sheet for Patients, Parents, and Caregivers for Emergency Use Authorization (EUA) of bebtelovimab for the Treatment of Coronavirus. Patient also reviewed and is agreeable to the estimated cost of treatment. Patient is agreeable to proceed.   

## 2021-01-10 NOTE — Progress Notes (Signed)
Diagnosis: COVID-19  Physician: Dr. Patrick Wright  Procedure: Covid Infusion Clinic Med: Bebtelovimab Provided patient with Bebtelovimab fact sheet for patients, parents, and caregivers prior to infusion.   Complications: No immediate complications noted  Discharge: Discharged home    

## 2021-01-24 ENCOUNTER — Ambulatory Visit: Payer: 59 | Admitting: Internal Medicine

## 2021-01-30 ENCOUNTER — Other Ambulatory Visit: Payer: Self-pay

## 2021-01-30 MED FILL — Semaglutide Soln Pen-inj 0.25 or 0.5 MG/DOSE (2 MG/1.5ML): SUBCUTANEOUS | 84 days supply | Qty: 4.5 | Fill #0 | Status: AC

## 2021-01-31 ENCOUNTER — Other Ambulatory Visit: Payer: Self-pay

## 2021-01-31 DIAGNOSIS — F5081 Binge eating disorder: Secondary | ICD-10-CM | POA: Diagnosis not present

## 2021-01-31 DIAGNOSIS — F39 Unspecified mood [affective] disorder: Secondary | ICD-10-CM | POA: Diagnosis not present

## 2021-01-31 DIAGNOSIS — F4310 Post-traumatic stress disorder, unspecified: Secondary | ICD-10-CM | POA: Diagnosis not present

## 2021-01-31 DIAGNOSIS — G4700 Insomnia, unspecified: Secondary | ICD-10-CM | POA: Diagnosis not present

## 2021-01-31 MED ORDER — HYDROXYZINE PAMOATE 25 MG PO CAPS
ORAL_CAPSULE | ORAL | 1 refills | Status: DC
  Filled 2021-01-31: qty 90, 90d supply, fill #0

## 2021-01-31 MED ORDER — SERTRALINE HCL 100 MG PO TABS
ORAL_TABLET | ORAL | 1 refills | Status: DC
  Filled 2021-01-31: qty 135, 90d supply, fill #0

## 2021-01-31 MED ORDER — QUETIAPINE FUMARATE 25 MG PO TABS
ORAL_TABLET | ORAL | 1 refills | Status: DC
  Filled 2021-01-31: qty 90, 90d supply, fill #0

## 2021-01-31 MED ORDER — VYVANSE 20 MG PO CAPS
ORAL_CAPSULE | Freq: Every day | ORAL | 0 refills | Status: DC
  Filled 2021-01-31: qty 90, 90d supply, fill #0

## 2021-02-14 ENCOUNTER — Other Ambulatory Visit: Payer: Self-pay

## 2021-02-14 MED FILL — Verapamil HCl Cap ER 24HR 200 MG: ORAL | 90 days supply | Qty: 90 | Fill #0 | Status: AC

## 2021-02-16 ENCOUNTER — Other Ambulatory Visit: Payer: Self-pay

## 2021-02-22 ENCOUNTER — Other Ambulatory Visit (HOSPITAL_COMMUNITY): Payer: Self-pay | Admitting: Gastroenterology

## 2021-02-22 ENCOUNTER — Other Ambulatory Visit: Payer: Self-pay | Admitting: Gastroenterology

## 2021-02-22 DIAGNOSIS — K7460 Unspecified cirrhosis of liver: Secondary | ICD-10-CM | POA: Diagnosis not present

## 2021-02-22 DIAGNOSIS — R300 Dysuria: Secondary | ICD-10-CM | POA: Diagnosis not present

## 2021-02-22 DIAGNOSIS — K7581 Nonalcoholic steatohepatitis (NASH): Secondary | ICD-10-CM | POA: Diagnosis not present

## 2021-02-23 ENCOUNTER — Other Ambulatory Visit: Payer: Self-pay

## 2021-02-23 MED ORDER — CIPROFLOXACIN HCL 250 MG PO TABS
ORAL_TABLET | ORAL | 0 refills | Status: DC
  Filled 2021-02-23: qty 6, 3d supply, fill #0

## 2021-03-06 ENCOUNTER — Ambulatory Visit (HOSPITAL_COMMUNITY)
Admission: RE | Admit: 2021-03-06 | Discharge: 2021-03-06 | Disposition: A | Discharge status: Home / Self Care | Payer: 59 | Source: Ambulatory Visit | Attending: Gastroenterology | Admitting: Gastroenterology

## 2021-03-06 ENCOUNTER — Other Ambulatory Visit: Payer: Self-pay

## 2021-03-06 DIAGNOSIS — K7581 Nonalcoholic steatohepatitis (NASH): Secondary | ICD-10-CM | POA: Diagnosis not present

## 2021-03-06 DIAGNOSIS — K7460 Unspecified cirrhosis of liver: Secondary | ICD-10-CM | POA: Diagnosis not present

## 2021-03-06 DIAGNOSIS — K7689 Other specified diseases of liver: Secondary | ICD-10-CM | POA: Diagnosis not present

## 2021-03-08 ENCOUNTER — Other Ambulatory Visit: Payer: Self-pay

## 2021-03-08 ENCOUNTER — Other Ambulatory Visit: Payer: Self-pay | Admitting: Gastroenterology

## 2021-03-08 ENCOUNTER — Other Ambulatory Visit (HOSPITAL_COMMUNITY): Payer: Self-pay | Admitting: Gastroenterology

## 2021-03-08 DIAGNOSIS — K7460 Unspecified cirrhosis of liver: Secondary | ICD-10-CM

## 2021-03-08 DIAGNOSIS — R935 Abnormal findings on diagnostic imaging of other abdominal regions, including retroperitoneum: Secondary | ICD-10-CM

## 2021-03-08 DIAGNOSIS — K7581 Nonalcoholic steatohepatitis (NASH): Secondary | ICD-10-CM

## 2021-03-09 ENCOUNTER — Other Ambulatory Visit: Payer: Self-pay

## 2021-03-12 ENCOUNTER — Other Ambulatory Visit: Payer: Self-pay

## 2021-03-15 ENCOUNTER — Ambulatory Visit
Admission: RE | Admit: 2021-03-15 | Discharge: 2021-03-15 | Disposition: A | Discharge status: Home / Self Care | Payer: 59 | Source: Ambulatory Visit | Attending: Gastroenterology | Admitting: Gastroenterology

## 2021-03-15 ENCOUNTER — Other Ambulatory Visit: Payer: Self-pay

## 2021-03-15 DIAGNOSIS — K7581 Nonalcoholic steatohepatitis (NASH): Secondary | ICD-10-CM | POA: Diagnosis not present

## 2021-03-15 DIAGNOSIS — K7460 Unspecified cirrhosis of liver: Secondary | ICD-10-CM | POA: Diagnosis not present

## 2021-03-15 DIAGNOSIS — R935 Abnormal findings on diagnostic imaging of other abdominal regions, including retroperitoneum: Secondary | ICD-10-CM | POA: Insufficient documentation

## 2021-03-15 DIAGNOSIS — N200 Calculus of kidney: Secondary | ICD-10-CM | POA: Diagnosis not present

## 2021-03-15 DIAGNOSIS — K449 Diaphragmatic hernia without obstruction or gangrene: Secondary | ICD-10-CM | POA: Diagnosis not present

## 2021-03-15 DIAGNOSIS — K5730 Diverticulosis of large intestine without perforation or abscess without bleeding: Secondary | ICD-10-CM | POA: Diagnosis not present

## 2021-03-15 MED ORDER — IOHEXOL 300 MG/ML  SOLN
75.0000 mL | Freq: Once | INTRAMUSCULAR | Status: AC | PRN
  Administered 2021-03-15: 75 mL via INTRAVENOUS

## 2021-03-15 MED ORDER — IOHEXOL 300 MG/ML  SOLN
100.0000 mL | Freq: Once | INTRAMUSCULAR | Status: AC | PRN
  Administered 2021-03-15: 100 mL via INTRAVENOUS

## 2021-03-16 ENCOUNTER — Other Ambulatory Visit: Payer: Self-pay

## 2021-03-16 MED ORDER — XIFAXAN 550 MG PO TABS
ORAL_TABLET | ORAL | 0 refills | Status: DC
  Filled 2021-03-16 – 2021-03-23 (×2): qty 42, 14d supply, fill #0

## 2021-03-23 ENCOUNTER — Other Ambulatory Visit: Payer: Self-pay

## 2021-03-26 ENCOUNTER — Other Ambulatory Visit: Payer: Self-pay

## 2021-03-26 MED FILL — Metformin HCl Tab ER 24HR 500 MG: ORAL | 60 days supply | Qty: 120 | Fill #1 | Status: AC

## 2021-03-26 MED FILL — Hydrochlorothiazide Tab 12.5 MG: ORAL | 90 days supply | Qty: 90 | Fill #1 | Status: AC

## 2021-03-26 MED FILL — Rosuvastatin Calcium Tab 10 MG: ORAL | 90 days supply | Qty: 90 | Fill #0 | Status: AC

## 2021-03-26 MED FILL — Pantoprazole Sodium EC Tab 40 MG (Base Equiv): ORAL | 30 days supply | Qty: 30 | Fill #0 | Status: AC

## 2021-04-05 ENCOUNTER — Other Ambulatory Visit: Payer: Self-pay

## 2021-04-05 MED FILL — Clotrimazole w/ Betamethasone Cream 1-0.05%: CUTANEOUS | 30 days supply | Qty: 90 | Fill #0 | Status: AC

## 2021-04-05 MED FILL — Semaglutide Soln Pen-inj 0.25 or 0.5 MG/DOSE (2 MG/1.5ML): SUBCUTANEOUS | 84 days supply | Qty: 4.5 | Fill #1 | Status: AC

## 2021-04-06 ENCOUNTER — Other Ambulatory Visit: Payer: Self-pay

## 2021-04-09 ENCOUNTER — Other Ambulatory Visit: Payer: Self-pay

## 2021-04-11 ENCOUNTER — Other Ambulatory Visit: Payer: Self-pay

## 2021-04-11 MED FILL — Losartan Potassium Tab 50 MG: ORAL | 90 days supply | Qty: 90 | Fill #1 | Status: AC

## 2021-04-30 ENCOUNTER — Other Ambulatory Visit: Payer: Self-pay

## 2021-04-30 DIAGNOSIS — F5081 Binge eating disorder: Secondary | ICD-10-CM | POA: Diagnosis not present

## 2021-04-30 DIAGNOSIS — F4310 Post-traumatic stress disorder, unspecified: Secondary | ICD-10-CM | POA: Diagnosis not present

## 2021-04-30 DIAGNOSIS — F39 Unspecified mood [affective] disorder: Secondary | ICD-10-CM | POA: Diagnosis not present

## 2021-04-30 DIAGNOSIS — G4700 Insomnia, unspecified: Secondary | ICD-10-CM | POA: Diagnosis not present

## 2021-04-30 MED ORDER — VYVANSE 20 MG PO CAPS
ORAL_CAPSULE | Freq: Every day | ORAL | 0 refills | Status: DC
  Filled 2021-04-30: qty 90, 90d supply, fill #0

## 2021-04-30 MED ORDER — QUETIAPINE FUMARATE 25 MG PO TABS
ORAL_TABLET | ORAL | 1 refills | Status: DC
  Filled 2021-04-30: qty 90, 90d supply, fill #0

## 2021-04-30 MED ORDER — SERTRALINE HCL 100 MG PO TABS
ORAL_TABLET | ORAL | 1 refills | Status: DC
  Filled 2021-04-30: qty 135, 90d supply, fill #0

## 2021-04-30 MED ORDER — HYDROXYZINE PAMOATE 25 MG PO CAPS
ORAL_CAPSULE | ORAL | 1 refills | Status: DC
  Filled 2021-04-30: qty 90, 90d supply, fill #0

## 2021-05-04 ENCOUNTER — Other Ambulatory Visit: Payer: Self-pay

## 2021-05-04 ENCOUNTER — Ambulatory Visit: Payer: 59 | Attending: Internal Medicine

## 2021-05-04 DIAGNOSIS — Z23 Encounter for immunization: Secondary | ICD-10-CM

## 2021-05-04 MED ORDER — MODERNA COVID-19 VACCINE 100 MCG/0.5ML IM SUSP
INTRAMUSCULAR | 0 refills | Status: DC
  Filled 2021-05-04: qty 0.25, 1d supply, fill #0

## 2021-05-04 NOTE — Progress Notes (Signed)
   Covid-19 Vaccination Clinic  Name:  ZORIANNA TALIAFERRO    MRN: [id number] DOB: 1951/01/24  05/04/2021  Ms. Grunwald was observed post Covid-19 immunization for 15 minutes without incident. She was provided with Vaccine Information Sheet and instruction to access the V-Safe system.   Ms. Spainhower was instructed to call 911 with any severe reactions post vaccine: Difficulty breathing  Swelling of face and throat  A fast heartbeat  A bad rash all over body  Dizziness and weakness   Immunizations Administered     Name Date Dose VIS Date Route   Moderna Covid-19 Booster Vaccine 05/04/2021  3:07 PM 0.25 mL 07/19/2020 Intramuscular   Manufacturer: Gala Murdoch   Lot: [id number]   NDC: 74944-967-59      Drusilla Kanner, PharmD, MBA Clinical Acute Care Pharmacist

## 2021-05-06 ENCOUNTER — Other Ambulatory Visit: Payer: Self-pay

## 2021-05-06 MED ORDER — PANTOPRAZOLE SODIUM 40 MG PO TBEC
DELAYED_RELEASE_TABLET | ORAL | 6 refills | Status: DC
  Filled 2021-05-06: qty 30, 30d supply, fill #0
  Filled 2021-06-11: qty 30, 30d supply, fill #1
  Filled 2021-07-09: qty 30, 30d supply, fill #2
  Filled 2021-08-20: qty 30, 30d supply, fill #3
  Filled 2021-10-03: qty 30, 30d supply, fill #4
  Filled 2021-10-31: qty 30, 30d supply, fill #5
  Filled 2021-12-18: qty 30, 30d supply, fill #6

## 2021-05-07 ENCOUNTER — Other Ambulatory Visit: Payer: Self-pay

## 2021-05-08 ENCOUNTER — Other Ambulatory Visit: Payer: Self-pay

## 2021-05-08 MED FILL — Verapamil HCl Cap ER 24HR 200 MG: ORAL | 90 days supply | Qty: 90 | Fill #1 | Status: AC

## 2021-05-17 ENCOUNTER — Other Ambulatory Visit: Payer: Self-pay

## 2021-05-17 DIAGNOSIS — E669 Obesity, unspecified: Secondary | ICD-10-CM | POA: Diagnosis not present

## 2021-05-17 DIAGNOSIS — M1712 Unilateral primary osteoarthritis, left knee: Secondary | ICD-10-CM | POA: Diagnosis not present

## 2021-05-17 DIAGNOSIS — M2392 Unspecified internal derangement of left knee: Secondary | ICD-10-CM | POA: Diagnosis not present

## 2021-05-17 DIAGNOSIS — M25562 Pain in left knee: Secondary | ICD-10-CM | POA: Diagnosis not present

## 2021-05-17 MED ORDER — CELECOXIB 200 MG PO CAPS
ORAL_CAPSULE | ORAL | 2 refills | Status: DC
  Filled 2021-05-17: qty 30, 30d supply, fill #0
  Filled 2021-06-18: qty 30, 30d supply, fill #1
  Filled 2021-07-09 – 2021-08-01 (×2): qty 30, 30d supply, fill #2

## 2021-05-30 ENCOUNTER — Other Ambulatory Visit: Payer: Self-pay

## 2021-05-30 ENCOUNTER — Ambulatory Visit
Admission: EM | Admit: 2021-05-30 | Discharge: 2021-05-30 | Disposition: A | Discharge status: Home / Self Care | Payer: 59 | Attending: Emergency Medicine | Admitting: Emergency Medicine

## 2021-05-30 DIAGNOSIS — R300 Dysuria: Secondary | ICD-10-CM | POA: Diagnosis not present

## 2021-05-30 LAB — POCT URINALYSIS DIP (MANUAL ENTRY)
Bilirubin, UA: NEGATIVE
Glucose, UA: NEGATIVE mg/dL
Ketones, POC UA: NEGATIVE mg/dL
Nitrite, UA: NEGATIVE
Protein Ur, POC: NEGATIVE mg/dL
Spec Grav, UA: 1.025 (ref 1.010–1.025)
Urobilinogen, UA: 0.2 E.U./dL
pH, UA: 5.5 (ref 5.0–8.0)

## 2021-05-30 MED ORDER — CEPHALEXIN 500 MG PO CAPS
500.0000 mg | ORAL_CAPSULE | Freq: Two times a day (BID) | ORAL | 0 refills | Status: AC
  Filled 2021-05-30: qty 14, 7d supply, fill #0

## 2021-05-30 NOTE — ED Triage Notes (Signed)
Pt c/o burning on urination, bladder spasms, and urgency/frequency x2-3 days. Hx of UTI's

## 2021-05-30 NOTE — Discharge Instructions (Addendum)
Take the antibiotic as directed.  The urine culture is pending.  We will call you if it shows the need to change or discontinue your antibiotic.    Follow up with your primary care provider if your symptoms are not improving.    

## 2021-05-30 NOTE — ED Provider Notes (Signed)
Renaldo Fiddler    CSN: [id number] Arrival date & time: 05/30/21  1546      History   Chief Complaint Chief Complaint  Patient presents with   Urinary Tract Infection    HPI April Singleton is a 70 y.o. female.  Patient presents with 3-day history of dysuria, urinary frequency, urinary urgency, bladder spasms.  She reports history of frequent UTIs and has previously seen a urologist.  Denies fever, chills, abdominal pain, vaginal discharge, pelvic pain, flank pain, or other symptoms.  No treatments attempted at home.  Her medical history includes diabetes, hypertension, asthma, cirrhosis, arthritis.  The history is provided by the patient and medical records.   Past Medical History:  Diagnosis Date   Asthma    Diabetes mellitus without complication (HCC)    Hypertension     Patient Active Problem List   Diagnosis Date Noted   Primary osteoarthritis of right knee 10/26/2020   Dyslipidemia 07/24/2020   Arthritis 07/24/2020   Cirrhosis of liver without ascites (HCC) 07/24/2020   Candidiasis 04/27/2020   Diverticulitis 04/13/2020   Rotator cuff arthropathy of left shoulder 03/27/2020   Class 2 obesity due to excess calories without serious comorbidity with body mass index (BMI) of 39.0 to 39.9 in adult 03/27/2020   Annual physical exam 03/27/2020   Essential hypertension 03/27/2020   Candidal vaginitis 03/21/2020   Type 2 diabetes mellitus with hyperglycemia, without long-term current use of insulin (HCC) 03/15/2020   Burning with urination 03/15/2020   Obesity (BMI 35.0-39.9 without comorbidity) 03/15/2020    Past Surgical History:  Procedure Laterality Date   ABDOMINAL HYSTERECTOMY     BREAST BIOPSY     TONSILLECTOMY      OB History     Gravida  3   Para  3   Term  3   Preterm      AB      Living  3      SAB      IAB      Ectopic      Multiple      Live Births  3            Home Medications    Prior to Admission medications    Medication Sig Start Date End Date Taking? Authorizing Provider  cephALEXin (KEFLEX) 500 MG capsule Take 1 capsule (500 mg total) by mouth 2 (two) times daily for 7 days. 05/30/21 06/06/21 Yes Mickie Bail, NP  celecoxib (CELEBREX) 200 MG capsule Take 1 capsule (200 mg total) by mouth once daily for 30 days 05/17/21     cholestyramine Lanetta Inch) 4 GM/DOSE powder SMARTSIG:1 Each By Mouth Twice Daily 12/21/19   [provider]  clotrimazole-betamethasone (LOTRISONE) cream APPLY TO AFFECTED AREA THREE TIMES DAILY 05/31/20 05/31/21  Corky Downs, MD  COVID-19 mRNA vaccine, Moderna, (MODERNA COVID-19 VACCINE) 100 MCG/0.5ML injection Inject into the muscle. 05/04/21   Judyann Munson, MD  estradiol (ESTRACE) 1 MG tablet TAKE 1 TABLET (1 MG TOTAL) BY MOUTH DAILY. 09/12/20 09/12/21  Corky Downs, MD  glucose blood test strip USE AS INSTRUCTED 10/26/20 10/26/21  Irish Lack, FNP  hydrochlorothiazide (HYDRODIURIL) 12.5 MG tablet TAKE 1 TABLET BY MOUTH ONCE DAILY WITH LOSARTAN 10/02/20 10/02/21  Corky Downs, MD  hydrochlorothiazide (HYDRODIURIL) 12.5 MG tablet TAKE 1 TABLET BY MOUTH ONCE DAILY WITH LOSARTAN 50MG  TABLET 09/27/20 09/27/21  09/29/21, MD  hydrOXYzine (VISTARIL) 25 MG capsule TAKE 1 CAPSULE BY MOUTH ONCE DAILY AS NEEDED 10/17/20 10/17/21  Darliss Ridgel, MD  hydrOXYzine (VISTARIL) 25 MG capsule Take 1 tablet by mouth daily as needed 01/31/21     hydrOXYzine (VISTARIL) 25 MG capsule Take 1 daily as needed 04/30/21     Lancets (FREESTYLE) lancets Use as instructed 03/16/20   Corky Downs, MD  lisdexamfetamine (VYVANSE) 20 MG capsule TAKE 1 CAPSULE BY MOUTH ONCE DAILY 10/17/20 04/15/21  Darliss Ridgel, MD  lisdexamfetamine (VYVANSE) 20 MG capsule TAKE 1 CAPSULE BY MOUTH ONCE DAILY 07/19/20 01/15/21  Darliss Ridgel, MD  lisdexamfetamine (VYVANSE) 20 MG capsule Take 1 daily 04/30/21     losartan (COZAAR) 25 MG tablet TAKE 1 TABLET BY MOUTH DAILY. 07/24/20 07/24/21  Corky Downs, MD  losartan (COZAAR) 50  MG tablet TAKE 1 TABLET BY MOUTH DAILY WITH HCTZ 10/02/20 10/02/21  Corky Downs, MD  losartan (COZAAR) 50 MG tablet TAKE 1 TABLET BY MOUTH ONCE DAILY WITH HCTZ 09/27/20 09/27/21  Corky Downs, MD  losartan-hydrochlorothiazide (HYZAAR) 50-12.5 MG tablet TAKE 1 TABLET BY MOUTH DAILY 10/02/20   Corky Downs, MD  meloxicam (MOBIC) 15 MG tablet TAKE 1 TABLET (15 MG TOTAL) BY MOUTH DAILY. 10/26/20 10/26/21  Irish Lack, FNP  meloxicam (MOBIC) 7.5 MG tablet TAKE 1 TABLET BY MOUTH ONCE DAILY FOR 7 DAYS 10/13/20 10/13/21  Randa Spike, DO  meloxicam (MOBIC) 7.5 MG tablet TAKE 1 TABLET (7.5 MG TOTAL) BY MOUTH DAILY. 10/02/20 10/02/21  Corky Downs, MD  meloxicam (MOBIC) 7.5 MG tablet TAKE 1 TABLET BY MOUTH ONCE DAILY 09/27/20 09/27/21  Corky Downs, MD  metFORMIN (GLUCOPHAGE-XR) 500 MG 24 hr tablet TAKE 1 TABLET BY MOUTH 2 (TWO) TIMES DAILY. 10/02/20 10/02/21  Corky Downs, MD  metFORMIN (GLUCOPHAGE-XR) 500 MG 24 hr tablet TAKE 1 TABLET BY MOUTH TWICE DAILY 09/27/20 09/27/21  Corky Downs, MD  milk thistle 175 MG tablet Take 175 mg by mouth daily.    [provider]  pantoprazole (PROTONIX) 40 MG tablet Take 40 mg by mouth daily. 12/23/19 12/22/20  [provider]  pantoprazole (PROTONIX) 40 MG tablet Take 1 tablet (40 mg total) by mouth as directed Take 30-45 minutes before breakfast 05/05/21     phenazopyridine (PYRIDIUM) 200 MG tablet Take 1 tablet (200 mg total) by mouth 3 (three) times daily as needed for pain. 05/23/17   Francesco Sor, NP  prednisoLONE acetate (PRED FORTE) 1 % ophthalmic suspension INSTILL 1 DROP INTO LEFT EYE THREE TIMES A DAY 10/18/20 10/18/21  Dingeldein, Viviann Spare, MD  QUEtiapine (SEROQUEL) 25 MG tablet TAKE 1 TABLET BY MOUTH AT BEDTIME ** STOP RISPERIDAL** 10/17/20 10/17/21  Darliss Ridgel, MD  QUEtiapine (SEROQUEL) 25 MG tablet TAKE 1 TABLET BY MOUTH ONCE DAILY AT BEDTIME 07/19/20 07/19/21  Darliss Ridgel, MD  QUEtiapine (SEROQUEL) 25 MG tablet Take 1 tabelt by mouth at  bedtime 01/31/21     QUEtiapine (SEROQUEL) 25 MG tablet Take 1 at bedtime 04/30/21     rosuvastatin (CRESTOR) 10 MG tablet Take 1 tablet (10 mg total) by mouth daily. 07/24/20   Corky Downs, MD  rosuvastatin (CRESTOR) 10 MG tablet TAKE 1 TABLET BY MOUTH DAILY 07/24/20 07/24/21  Corky Downs, MD  Semaglutide,0.25 or 0.5MG /DOS, 2 MG/1.5ML SOPN INJECT 0.5 MG INTO THE SKIN ONCE A WEEK. 11/17/20 11/17/21  Corky Downs, MD  sertraline (ZOLOFT) 100 MG tablet Take 150 mg by mouth daily. 01/17/20   [provider]  sertraline (ZOLOFT) 100 MG tablet TAKE 1 AND 1/2 TABLET BY MOUTH DAILY WITH BREAKFAST 10/17/20 10/17/21  Maryruth Bun,  Doralee AlbinoAarti K, MD  sertraline (ZOLOFT) 100 MG tablet TAKE 1 & 1/2 TABLETS (150MG ) BY MOUTH DAILY WITH BREAKFAST 07/19/20 07/19/21  Darliss RidgelKapur, Aarti K, MD  sertraline (ZOLOFT) 100 MG tablet Take 1 and 1/2 tablet po daily with breakfast 01/31/21     sertraline (ZOLOFT) 100 MG tablet Take 1 and 1/2 tablet po daily with breakfast 04/30/21     tiZANidine (ZANAFLEX) 4 MG tablet TAKE 1 TABLET BY MOUTH 3 TIMES DAILY 12/28/20 12/28/21  Kandyce RudKernodle, George W Jr., MD  verapamil (CALAN-SR) 180 MG CR tablet  04/04/17   [provider]  Verapamil HCl CR 200 MG CP24 TAKE 1 CAPSULE BY MOUTH ONCE DAILY 07/18/20 08/15/21  Corky DownsMasoud, Javed, MD  VYVANSE 20 MG capsule Take 20 mg by mouth at bedtime. 01/17/20   [provider]  zolpidem (AMBIEN) 10 MG tablet Take 10 mg by mouth at bedtime as needed. 05/18/18   [provider]    Family History Family History  Problem Relation Age of Onset   Liver cancer Father    Lung cancer Father     Social History Social History   Tobacco Use   Smoking status: Never   Smokeless tobacco: Never  Vaping Use   Vaping Use: Never used  Substance Use Topics   Alcohol use: Yes    Alcohol/week: 0.0 standard drinks   Drug use: Never     Allergies   Codeine, Penicillins, Prednisone, and Sulfa antibiotics   Review of Systems Review of Systems   Constitutional:  Negative for chills and fever.  Respiratory:  Negative for cough and shortness of breath.   Cardiovascular:  Negative for chest pain and palpitations.  Gastrointestinal:  Negative for abdominal pain and vomiting.  Genitourinary:  Positive for dysuria, frequency and urgency. Negative for flank pain, hematuria, pelvic pain and vaginal discharge.  Skin:  Negative for color change and rash.  All other systems reviewed and are negative.   Physical Exam Triage Vital Signs ED Triage Vitals  Enc Vitals Group     BP 05/30/21 1601 (!) 155/76     Pulse Rate 05/30/21 1601 95     Resp 05/30/21 1601 18     Temp 05/30/21 1601 99.1 F (37.3 C)     Temp Source 05/30/21 1601 Oral     SpO2 05/30/21 1601 95 %     Weight --      Height --      Head Circumference --      Peak Flow --      Pain Score 05/30/21 1559 3     Pain Loc --      Pain Edu? --      Excl. in GC? --    No data found.  Updated Vital Signs BP (!) 155/76 (BP Location: Left Arm)   Pulse 95   Temp 99.1 F (37.3 C) (Oral)   Resp 18   SpO2 95%   Visual Acuity Right Eye Distance:   Left Eye Distance:   Bilateral Distance:    Right Eye Near:   Left Eye Near:    Bilateral Near:     Physical Exam Vitals and nursing note reviewed.  Constitutional:      General: She is not in acute distress.    Appearance: She is well-developed. She is obese. She is not ill-appearing.  HENT:     Head: Normocephalic and atraumatic.     Mouth/Throat:     Mouth: Mucous membranes are moist.  Eyes:  Conjunctiva/sclera: Conjunctivae normal.  Cardiovascular:     Rate and Rhythm: Normal rate and regular rhythm.     Heart sounds: Normal heart sounds.  Pulmonary:     Effort: Pulmonary effort is normal. No respiratory distress.     Breath sounds: Normal breath sounds.  Abdominal:     General: Bowel sounds are normal.     Palpations: Abdomen is soft.     Tenderness: There is no abdominal tenderness. There is no right CVA  tenderness, left CVA tenderness, guarding or rebound.  Musculoskeletal:     Cervical back: Neck supple.  Skin:    General: Skin is warm and dry.  Neurological:     General: No focal deficit present.     Mental Status: She is alert and oriented to person, place, and time.     Gait: Gait normal.  Psychiatric:        Mood and Affect: Mood normal.        Behavior: Behavior normal.     UC Treatments / Results  Labs (all labs ordered are listed, but only abnormal results are displayed) Labs Reviewed  POCT URINALYSIS DIP (MANUAL ENTRY) - Abnormal; Notable for the following components:      Result Value   Clarity, UA cloudy (*)    Blood, UA trace-intact (*)    Leukocytes, UA Small (1+) (*)    All other components within normal limits  URINE CULTURE    EKG   Radiology No results found.  Procedures Procedures (including critical care time)  Medications Ordered in UC Medications - No data to display  Initial Impression / Assessment and Plan / UC Course  I have reviewed the triage vital signs and the nursing notes.  Pertinent labs & imaging results that were available during my care of the patient were reviewed by me and considered in my medical decision making (see chart for details).  Dysuria.  Treating with Keflex. Urine culture pending. Discussed with patient that we will call her if the urine culture shows the need to change or discontinue the antibiotic. Instructed her to follow-up with her PCP if her symptoms are not improving. Patient agrees to plan of care.      Final Clinical Impressions(s) / UC Diagnoses   Final diagnoses:  Dysuria     Discharge Instructions      Take the antibiotic as directed.  The urine culture is pending.  We will call you if it shows the need to change or discontinue your antibiotic.    Follow up with your primary care provider if your symptoms are not improving.          ED Prescriptions     Medication Sig Dispense Auth.  Provider   cephALEXin (KEFLEX) 500 MG capsule Take 1 capsule (500 mg total) by mouth 2 (two) times daily for 7 days. 14 capsule Mickie Bail, NP      PDMP not reviewed this encounter.   Mickie Bail, NP 05/30/21 [phone]

## 2021-06-02 LAB — URINE CULTURE: Culture: >=100000 — AB

## 2021-06-11 ENCOUNTER — Other Ambulatory Visit: Payer: Self-pay

## 2021-06-11 ENCOUNTER — Other Ambulatory Visit: Payer: Self-pay | Admitting: Internal Medicine

## 2021-06-11 MED FILL — Metformin HCl Tab ER 24HR 500 MG: ORAL | 30 days supply | Qty: 60 | Fill #0 | Status: AC

## 2021-06-12 ENCOUNTER — Other Ambulatory Visit: Payer: Self-pay

## 2021-06-12 ENCOUNTER — Ambulatory Visit (INDEPENDENT_AMBULATORY_CARE_PROVIDER_SITE_OTHER): Payer: 59 | Admitting: Plastic Surgery

## 2021-06-12 ENCOUNTER — Encounter: Payer: Self-pay | Admitting: Plastic Surgery

## 2021-06-12 VITALS — BP 136/77 | HR 87 | Ht 60.0 in | Wt 197.8 lb

## 2021-06-12 DIAGNOSIS — M546 Pain in thoracic spine: Secondary | ICD-10-CM

## 2021-06-12 DIAGNOSIS — M549 Dorsalgia, unspecified: Secondary | ICD-10-CM | POA: Insufficient documentation

## 2021-06-12 DIAGNOSIS — K7460 Unspecified cirrhosis of liver: Secondary | ICD-10-CM

## 2021-06-12 DIAGNOSIS — E669 Obesity, unspecified: Secondary | ICD-10-CM

## 2021-06-12 DIAGNOSIS — M793 Panniculitis, unspecified: Secondary | ICD-10-CM | POA: Diagnosis not present

## 2021-06-12 DIAGNOSIS — G8929 Other chronic pain: Secondary | ICD-10-CM | POA: Diagnosis not present

## 2021-06-12 DIAGNOSIS — E1165 Type 2 diabetes mellitus with hyperglycemia: Secondary | ICD-10-CM

## 2021-06-12 DIAGNOSIS — M542 Cervicalgia: Secondary | ICD-10-CM | POA: Diagnosis not present

## 2021-06-12 NOTE — Progress Notes (Signed)
Patient ID: April Singleton, female    DOB: 05-31-1951, 70 y.o.   MRN: [id number]   Chief Complaint  Patient presents with   Advice Only    The patient is a 70 y.o. female with a history of mammary hyperplasia for several years.  She is also here for a consultation for panniculitis.  She has extremely large breasts causing symptoms that include the following:  Back pain in the upper and lower back, including neck pain. She pulls or pins her bra straps to provide better lift and relief of the pressure and pain. She notices relief by holding her breast up manually.  Her shoulder straps cause grooves and pain and pressure that requires padding for relief. Pain medication is sometimes required with motrin and tylenol.  Activities that are hindered by enlarged breasts include: exercise and running.  She has tried supportive clothing as well as fitted bras without improvement.  Her breasts are extremely large and fairly symmetric.  She has hyperpigmentation of the inframammary area on both sides.  The sternal to nipple distance on the right is 35 cm and the left is 35 cm.  The IMF distance is 15 cm.  She is 5 feet tall and weighs 197 pounds.  The BMI = 38.5.  Preoperative bra size = 46 DD cup.  She would like to be a C cup.  The estimated excess breast tissue to be removed at the time of surgery = 600 grams on the left and 600 grams on the right.  Mammogram history: 11/21 and negative.  Family history of breast cancer: Maternal aunts.  Tobacco use: Never  but she does have diabetes.   The patient expresses the desire to pursue surgical intervention.   Review of Systems  Constitutional:  Positive for activity change. Negative for appetite change.  Eyes: Negative.   Respiratory:  Negative for chest tightness and shortness of breath.   Cardiovascular:  Negative for leg swelling.  Gastrointestinal: Negative.   Endocrine: Negative.   Genitourinary: Negative.   Musculoskeletal:  Positive for back pain  and neck pain.  Skin:  Positive for color change and rash.  Hematological: Negative.   Psychiatric/Behavioral: Negative.     Past Medical History:  Diagnosis Date   Asthma    Diabetes mellitus without complication (HCC)    Hypertension     Past Surgical History:  Procedure Laterality Date   ABDOMINAL HYSTERECTOMY     BREAST BIOPSY     TONSILLECTOMY        Current Outpatient Medications:    celecoxib (CELEBREX) 200 MG capsule, Take 1 capsule (200 mg total) by mouth once daily for 30 days, Disp: 30 capsule, Rfl: 2   COVID-19 mRNA vaccine, Moderna, (MODERNA COVID-19 VACCINE) 100 MCG/0.5ML injection, Inject into the muscle., Disp: 0.25 mL, Rfl: 0   glucose blood test strip, USE AS INSTRUCTED, Disp: 100 strip, Rfl: 12   hydrochlorothiazide (HYDRODIURIL) 12.5 MG tablet, TAKE 1 TABLET BY MOUTH ONCE DAILY WITH LOSARTAN 50MG  TABLET, Disp: 90 tablet, Rfl: 3   Lancets (FREESTYLE) lancets, Use as instructed, Disp: 100 each, Rfl: 12   lisdexamfetamine (VYVANSE) 20 MG capsule, Take 1 daily, Disp: 90 capsule, Rfl: 0   losartan (COZAAR) 50 MG tablet, TAKE 1 TABLET BY MOUTH DAILY WITH HCTZ, Disp: 90 tablet, Rfl: 3   metFORMIN (GLUCOPHAGE-XR) 500 MG 24 hr tablet, TAKE 1 TABLET BY MOUTH 2 (TWO) TIMES DAILY., Disp: 60 tablet, Rfl: 6   milk thistle 175 MG tablet,  Take 175 mg by mouth daily., Disp: , Rfl:    pantoprazole (PROTONIX) 40 MG tablet, Take 1 tablet (40 mg total) by mouth as directed Take 30-45 minutes before breakfast, Disp: 30 tablet, Rfl: 6   phenazopyridine (PYRIDIUM) 200 MG tablet, Take 1 tablet (200 mg total) by mouth 3 (three) times daily as needed for pain., Disp: 10 tablet, Rfl: 0   QUEtiapine (SEROQUEL) 25 MG tablet, Take 1 at bedtime, Disp: 90 tablet, Rfl: 1   rosuvastatin (CRESTOR) 10 MG tablet, TAKE 1 TABLET BY MOUTH DAILY, Disp: 90 tablet, Rfl: 3   Semaglutide,0.25 or 0.5MG /DOS, 2 MG/1.5ML SOPN, INJECT 0.5 MG INTO THE SKIN ONCE A WEEK., Disp: 4.5 mL, Rfl: 2   sertraline (ZOLOFT)  100 MG tablet, Take 1 and 1/2 tablet po daily with breakfast, Disp: 135 tablet, Rfl: 1   tiZANidine (ZANAFLEX) 4 MG tablet, TAKE 1 TABLET BY MOUTH 3 TIMES DAILY, Disp: 90 tablet, Rfl: 1   Verapamil HCl CR 200 MG CP24, TAKE 1 CAPSULE BY MOUTH ONCE DAILY, Disp: 90 capsule, Rfl: 4   cholestyramine (QUESTRAN) 4 GM/DOSE powder, SMARTSIG:1 Each By Mouth Twice Daily, Disp: , Rfl:    estradiol (ESTRACE) 1 MG tablet, TAKE 1 TABLET (1 MG TOTAL) BY MOUTH DAILY., Disp: 30 tablet, Rfl: 6   hydrochlorothiazide (HYDRODIURIL) 12.5 MG tablet, TAKE 1 TABLET BY MOUTH ONCE DAILY WITH LOSARTAN, Disp: 90 tablet, Rfl: 3   hydrOXYzine (VISTARIL) 25 MG capsule, TAKE 1 CAPSULE BY MOUTH ONCE DAILY AS NEEDED, Disp: 90 capsule, Rfl: 0   hydrOXYzine (VISTARIL) 25 MG capsule, Take 1 tablet by mouth daily as needed, Disp: 90 capsule, Rfl: 1   hydrOXYzine (VISTARIL) 25 MG capsule, Take 1 daily as needed, Disp: 90 capsule, Rfl: 1   lisdexamfetamine (VYVANSE) 20 MG capsule, TAKE 1 CAPSULE BY MOUTH ONCE DAILY, Disp: 90 capsule, Rfl: 0   lisdexamfetamine (VYVANSE) 20 MG capsule, TAKE 1 CAPSULE BY MOUTH ONCE DAILY, Disp: 90 capsule, Rfl: 0   losartan (COZAAR) 25 MG tablet, TAKE 1 TABLET BY MOUTH DAILY., Disp: 30 tablet, Rfl: 0   losartan (COZAAR) 50 MG tablet, TAKE 1 TABLET BY MOUTH ONCE DAILY WITH HCTZ, Disp: 90 tablet, Rfl: 3   losartan-hydrochlorothiazide (HYZAAR) 50-12.5 MG tablet, TAKE 1 TABLET BY MOUTH DAILY, Disp: 90 tablet, Rfl: 3   meloxicam (MOBIC) 15 MG tablet, TAKE 1 TABLET (15 MG TOTAL) BY MOUTH DAILY., Disp: 30 tablet, Rfl: 0   meloxicam (MOBIC) 7.5 MG tablet, TAKE 1 TABLET BY MOUTH ONCE DAILY FOR 7 DAYS, Disp: 7 tablet, Rfl: 0   meloxicam (MOBIC) 7.5 MG tablet, TAKE 1 TABLET (7.5 MG TOTAL) BY MOUTH DAILY., Disp: 30 tablet, Rfl: 0   meloxicam (MOBIC) 7.5 MG tablet, TAKE 1 TABLET BY MOUTH ONCE DAILY, Disp: 30 tablet, Rfl: 0   metFORMIN (GLUCOPHAGE-XR) 500 MG 24 hr tablet, TAKE 1 TABLET BY MOUTH TWICE DAILY, Disp: 60  tablet, Rfl: 6   metFORMIN (GLUCOPHAGE-XR) 500 MG 24 hr tablet, TAKE 1 TABLET BY MOUTH TWICE DAILY, Disp: 60 tablet, Rfl: 6   pantoprazole (PROTONIX) 40 MG tablet, Take 40 mg by mouth daily., Disp: , Rfl:    prednisoLONE acetate (PRED FORTE) 1 % ophthalmic suspension, INSTILL 1 DROP INTO LEFT EYE THREE TIMES A DAY, Disp: 5 mL, Rfl: 0   QUEtiapine (SEROQUEL) 25 MG tablet, TAKE 1 TABLET BY MOUTH AT BEDTIME ** STOP RISPERIDAL**, Disp: 90 tablet, Rfl: 1   QUEtiapine (SEROQUEL) 25 MG tablet, TAKE 1 TABLET BY MOUTH ONCE DAILY AT  BEDTIME, Disp: 90 tablet, Rfl: 1   QUEtiapine (SEROQUEL) 25 MG tablet, Take 1 tabelt by mouth at bedtime, Disp: 90 tablet, Rfl: 1   rosuvastatin (CRESTOR) 10 MG tablet, Take 1 tablet (10 mg total) by mouth daily., Disp: 90 tablet, Rfl: 3   sertraline (ZOLOFT) 100 MG tablet, Take 150 mg by mouth daily., Disp: , Rfl:    sertraline (ZOLOFT) 100 MG tablet, TAKE 1 AND 1/2 TABLET BY MOUTH DAILY WITH BREAKFAST, Disp: 135 tablet, Rfl: 1   sertraline (ZOLOFT) 100 MG tablet, TAKE 1 & 1/2 TABLETS (150MG ) BY MOUTH DAILY WITH BREAKFAST, Disp: 135 tablet, Rfl: 1   sertraline (ZOLOFT) 100 MG tablet, Take 1 and 1/2 tablet po daily with breakfast, Disp: 135 tablet, Rfl: 1   verapamil (CALAN-SR) 180 MG CR tablet, , Disp: , Rfl: 4   VYVANSE 20 MG capsule, Take 20 mg by mouth at bedtime., Disp: , Rfl:    zolpidem (AMBIEN) 10 MG tablet, Take 10 mg by mouth at bedtime as needed., Disp: , Rfl:    Objective:   Vitals:   06/12/21 1125  BP: 136/77  Pulse: 87  SpO2: 100%    Physical Exam Vitals and nursing note reviewed.  Constitutional:      Appearance: Normal appearance.  HENT:     Head: Normocephalic and atraumatic.  Cardiovascular:     Rate and Rhythm: Normal rate.     Pulses: Normal pulses.  Pulmonary:     Effort: Pulmonary effort is normal. No respiratory distress.     Breath sounds: No wheezing.  Abdominal:     General: Abdomen is flat. There is no distension.     Tenderness:  There is no abdominal tenderness. There is no guarding.     Hernia: No hernia is present.  Skin:    General: Skin is warm.     Capillary Refill: Capillary refill takes less than 2 seconds.     Coloration: Skin is not jaundiced or pale.     Findings: Erythema and rash present. No bruising.  Neurological:     General: No focal deficit present.     Mental Status: She is alert.    Assessment & Plan:  Type 2 diabetes mellitus with hyperglycemia, without long-term current use of insulin (HCC)  Cirrhosis of liver without ascites, unspecified hepatic cirrhosis type (HCC)  Obesity (BMI 35.0-39.9 without comorbidity)  Panniculitis  Chronic bilateral thoracic back pain  Neck pain  The procedure the patient selected and that was best for the patient was discussed. The risk were discussed and include but not limited to the following:  Breast asymmetry, fluid accumulation, firmness of the breast, inability to breast feed, loss of nipple or areola, skin loss, change in skin and nipple sensation, fat necrosis of the breast tissue, bleeding, infection and healing delay.  There are risks of anesthesia and injury to nerves or blood vessels.  Allergic reaction to tape, suture and skin glue are possible.  There will be swelling.  Any of these can lead to the need for revisional surgery.  A breast reduction has potential to interfere with diagnostic procedures in the future.  This procedure is best done when the breast is fully developed.  Changes in the breast will continue to occur over time: pregnancy, weight gain or weigh loss.    Total time: 40 minutes. This includes time spent with the patient during the visit as well as time spent before and after the visit reviewing the chart, documenting the encounter and  ordering pertinent studies. and literature emailed to the patient.   Physical therapy: She has done physical therapy several times over the past few years. Mammogram: She will need a November 2022  mammogram prior to any breast surgery  Pictures were obtained of the patient and placed in the chart with the patient's or guardian's permission.  The patient is a good candidate for a panniculectomy.  Due to the rashes she would like to start with the panniculectomy.  We will also need a updated hemoglobin A1c prior to surgery she may be interested in an abdominoplasty as well.  Her excess pannus and rash are severe.  She is also interested in bilateral breast reduction.  Alena Bills April Bahner, DO

## 2021-06-13 ENCOUNTER — Other Ambulatory Visit: Payer: Self-pay

## 2021-06-13 MED ORDER — AZITHROMYCIN 250 MG PO TABS
ORAL_TABLET | ORAL | 0 refills | Status: AC
  Filled 2021-06-13: qty 6, 5d supply, fill #0

## 2021-06-18 ENCOUNTER — Other Ambulatory Visit: Payer: Self-pay

## 2021-07-09 ENCOUNTER — Other Ambulatory Visit: Payer: Self-pay

## 2021-07-09 ENCOUNTER — Other Ambulatory Visit: Payer: Self-pay | Admitting: Internal Medicine

## 2021-07-09 DIAGNOSIS — F5081 Binge eating disorder: Secondary | ICD-10-CM | POA: Diagnosis not present

## 2021-07-09 DIAGNOSIS — F39 Unspecified mood [affective] disorder: Secondary | ICD-10-CM | POA: Diagnosis not present

## 2021-07-09 DIAGNOSIS — G4700 Insomnia, unspecified: Secondary | ICD-10-CM | POA: Diagnosis not present

## 2021-07-09 DIAGNOSIS — F4310 Post-traumatic stress disorder, unspecified: Secondary | ICD-10-CM | POA: Diagnosis not present

## 2021-07-09 MED ORDER — VYVANSE 20 MG PO CAPS
ORAL_CAPSULE | Freq: Every day | ORAL | 0 refills | Status: DC
  Filled 2021-07-09 – 2021-08-01 (×3): qty 90, 90d supply, fill #0

## 2021-07-09 MED ORDER — OZEMPIC (0.25 OR 0.5 MG/DOSE) 2 MG/1.5ML ~~LOC~~ SOPN
0.5000 mg | PEN_INJECTOR | SUBCUTANEOUS | 2 refills | Status: DC
  Filled 2021-07-09: qty 4.5, 84d supply, fill #0

## 2021-07-09 MED ORDER — QUETIAPINE FUMARATE 50 MG PO TABS
ORAL_TABLET | ORAL | 0 refills | Status: DC
  Filled 2021-07-09: qty 90, 90d supply, fill #0

## 2021-07-09 MED ORDER — QUETIAPINE FUMARATE 50 MG PO TABS: ORAL_TABLET | ORAL | 0 refills | Status: DC

## 2021-07-09 MED ORDER — HYDROXYZINE PAMOATE 25 MG PO CAPS
ORAL_CAPSULE | ORAL | 1 refills | Status: DC
  Filled 2021-07-09: qty 90, 90d supply, fill #0

## 2021-07-09 MED ORDER — SERTRALINE HCL 100 MG PO TABS
ORAL_TABLET | ORAL | 1 refills | Status: DC
  Filled 2021-07-09: qty 135, 90d supply, fill #0

## 2021-07-09 MED FILL — Losartan Potassium Tab 50 MG: ORAL | 90 days supply | Qty: 90 | Fill #0 | Status: AC

## 2021-07-09 MED FILL — Hydrochlorothiazide Tab 12.5 MG: ORAL | 90 days supply | Qty: 90 | Fill #2 | Status: AC

## 2021-07-09 MED FILL — Metformin HCl Tab ER 24HR 500 MG: ORAL | 30 days supply | Qty: 60 | Fill #0 | Status: AC

## 2021-07-09 MED FILL — Glucose Blood Test Strip: 90 days supply | Qty: 100 | Fill #0 | Status: AC

## 2021-07-09 MED FILL — Estradiol Tab 1 MG: ORAL | 30 days supply | Qty: 30 | Fill #1 | Status: AC

## 2021-07-09 MED FILL — Metformin HCl Tab ER 24HR 500 MG: ORAL | 30 days supply | Qty: 60 | Fill #1 | Status: CN

## 2021-07-10 ENCOUNTER — Other Ambulatory Visit: Payer: Self-pay

## 2021-07-11 ENCOUNTER — Other Ambulatory Visit: Payer: Self-pay

## 2021-07-12 ENCOUNTER — Encounter: Payer: Self-pay | Admitting: Plastic Surgery

## 2021-07-23 ENCOUNTER — Other Ambulatory Visit: Payer: Self-pay | Admitting: Internal Medicine

## 2021-07-23 DIAGNOSIS — Z1231 Encounter for screening mammogram for malignant neoplasm of breast: Secondary | ICD-10-CM

## 2021-08-01 ENCOUNTER — Other Ambulatory Visit: Payer: Self-pay

## 2021-08-02 ENCOUNTER — Encounter: Payer: Self-pay | Admitting: Plastic Surgery

## 2021-08-13 ENCOUNTER — Other Ambulatory Visit: Payer: Self-pay

## 2021-08-13 ENCOUNTER — Telehealth: Payer: Self-pay | Admitting: Plastic Surgery

## 2021-08-13 ENCOUNTER — Ambulatory Visit (INDEPENDENT_AMBULATORY_CARE_PROVIDER_SITE_OTHER): Payer: 59 | Admitting: Internal Medicine

## 2021-08-13 VITALS — BP 145/69 | HR 98 | Ht 60.0 in | Wt 200.9 lb

## 2021-08-13 DIAGNOSIS — I10 Essential (primary) hypertension: Secondary | ICD-10-CM | POA: Diagnosis not present

## 2021-08-13 DIAGNOSIS — M546 Pain in thoracic spine: Secondary | ICD-10-CM

## 2021-08-13 DIAGNOSIS — G8929 Other chronic pain: Secondary | ICD-10-CM

## 2021-08-13 DIAGNOSIS — M793 Panniculitis, unspecified: Secondary | ICD-10-CM

## 2021-08-13 DIAGNOSIS — R300 Dysuria: Secondary | ICD-10-CM

## 2021-08-13 DIAGNOSIS — B379 Candidiasis, unspecified: Secondary | ICD-10-CM | POA: Diagnosis not present

## 2021-08-13 DIAGNOSIS — Z23 Encounter for immunization: Secondary | ICD-10-CM

## 2021-08-13 DIAGNOSIS — E119 Type 2 diabetes mellitus without complications: Secondary | ICD-10-CM | POA: Diagnosis not present

## 2021-08-13 DIAGNOSIS — E669 Obesity, unspecified: Secondary | ICD-10-CM | POA: Diagnosis not present

## 2021-08-13 DIAGNOSIS — E1165 Type 2 diabetes mellitus with hyperglycemia: Secondary | ICD-10-CM

## 2021-08-13 DIAGNOSIS — K7460 Unspecified cirrhosis of liver: Secondary | ICD-10-CM | POA: Diagnosis not present

## 2021-08-13 DIAGNOSIS — R399 Unspecified symptoms and signs involving the genitourinary system: Secondary | ICD-10-CM | POA: Diagnosis not present

## 2021-08-13 LAB — POCT GLYCOSYLATED HEMOGLOBIN (HGB A1C): Hemoglobin A1C: 7.2 % — AB (ref 4.0–5.6)

## 2021-08-13 LAB — POCT URINALYSIS DIPSTICK
Appearance: NORMAL
Blood, UA: NEGATIVE
Glucose, UA: NEGATIVE
Nitrite, UA: NEGATIVE
Protein, UA: NEGATIVE
Spec Grav, UA: >=1.03 — AB (ref 1.010–1.025)
Urobilinogen, UA: 2 E.U./dL — AB
pH, UA: 6 (ref 5.0–8.0)

## 2021-08-13 LAB — GLUCOSE, POCT (MANUAL RESULT ENTRY): POC Glucose: 147 mg/dl — AB (ref 70–99)

## 2021-08-13 NOTE — Assessment & Plan Note (Signed)
UA does not show any nitrites

## 2021-08-13 NOTE — Progress Notes (Signed)
Established Patient Office Visit  Subjective:  Patient ID: April Singleton, female    DOB: 15-Mar-1951  Age: 70 y.o. MRN: [id number]  CC:  Chief Complaint  Patient presents with   Follow-up    HPI  April Singleton presents for check up, for recurrent yeast infection in the lower abdomen.  Past Medical History:  Diagnosis Date   Asthma    Diabetes mellitus without complication (HCC)    Hypertension     Past Surgical History:  Procedure Laterality Date   ABDOMINAL HYSTERECTOMY     BREAST BIOPSY     TONSILLECTOMY      Family History  Problem Relation Age of Onset   Liver cancer Father    Lung cancer Father     Social History   Socioeconomic History   Marital status: Married    Spouse name: Not on file   Number of children: Not on file   Years of education: Not on file   Highest education level: Not on file  Occupational History   Not on file  Tobacco Use   Smoking status: Never   Smokeless tobacco: Never  Vaping Use   Vaping Use: Never used  Substance and Sexual Activity   Alcohol use: Yes    Alcohol/week: 0.0 standard drinks   Drug use: Never   Sexual activity: Yes    Birth control/protection: Surgical    Comment: Hysterectomy  Other Topics Concern   Not on file  Social History Narrative   Not on file   Social Determinants of Health   Financial Resource Strain: Not on file  Food Insecurity: Not on file  Transportation Needs: Not on file  Physical Activity: Not on file  Stress: Not on file  Social Connections: Not on file  Intimate Partner Violence: Not on file     Current Outpatient Medications:    celecoxib (CELEBREX) 200 MG capsule, Take 1 capsule (200 mg total) by mouth once daily for 30 days, Disp: 30 capsule, Rfl: 2   cholestyramine (QUESTRAN) 4 GM/DOSE powder, SMARTSIG:1 Each By Mouth Twice Daily, Disp: , Rfl:    clotrimazole-betamethasone (LOTRISONE) cream, Apply 1 application topically daily., Disp: 30 g, Rfl: 4   estradiol  (ESTRACE) 1 MG tablet, TAKE 1 TABLET (1 MG TOTAL) BY MOUTH DAILY., Disp: 30 tablet, Rfl: 6   glucose blood test strip, USE AS INSTRUCTED, Disp: 100 strip, Rfl: 12   hydrochlorothiazide (HYDRODIURIL) 12.5 MG tablet, TAKE 1 TABLET BY MOUTH ONCE DAILY WITH LOSARTAN, Disp: 90 tablet, Rfl: 3   hydrOXYzine (VISTARIL) 25 MG capsule, TAKE 1 CAPSULE BY MOUTH ONCE DAILY AS NEEDED, Disp: 90 capsule, Rfl: 0   losartan (COZAAR) 50 MG tablet, TAKE 1 TABLET BY MOUTH DAILY WITH HCTZ, Disp: 90 tablet, Rfl: 3   losartan-hydrochlorothiazide (HYZAAR) 50-12.5 MG tablet, TAKE 1 TABLET BY MOUTH DAILY, Disp: 90 tablet, Rfl: 3   metFORMIN (GLUCOPHAGE-XR) 500 MG 24 hr tablet, TAKE 1 TABLET BY MOUTH TWICE DAILY, Disp: 60 tablet, Rfl: 6   pantoprazole (PROTONIX) 40 MG tablet, Take 1 tablet (40 mg total) by mouth as directed Take 30-45 minutes before breakfast, Disp: 30 tablet, Rfl: 6   QUEtiapine (SEROQUEL) 50 MG tablet, Take 1 tablet by mouth at bedtime, Disp: 90 tablet, Rfl: 0   rosuvastatin (CRESTOR) 10 MG tablet, Take 1 tablet (10 mg total) by mouth daily., Disp: 90 tablet, Rfl: 3   Semaglutide, 1 MG/DOSE, 4 MG/3ML SOPN, Inject 1 mg as directed once a week., Disp: 3 mL,  Rfl: 12   sertraline (ZOLOFT) 100 MG tablet, TAKE 1 AND 1/2 TABLET BY MOUTH DAILY WITH BREAKFAST, Disp: 135 tablet, Rfl: 1   tiZANidine (ZANAFLEX) 4 MG tablet, TAKE 1 TABLET BY MOUTH 3 TIMES DAILY, Disp: 90 tablet, Rfl: 1   Verapamil HCl CR 200 MG CP24, TAKE 1 CAPSULE BY MOUTH ONCE DAILY, Disp: 90 capsule, Rfl: 4   VYVANSE 20 MG capsule, Take 20 mg by mouth at bedtime., Disp: , Rfl:    zolpidem (AMBIEN) 10 MG tablet, Take 10 mg by mouth at bedtime as needed., Disp: , Rfl:    Allergies  Allergen Reactions   Codeine Other (See Comments)   Penicillins    Prednisone Other (See Comments)    Makes her violent    Sulfa Antibiotics Hives    ROS Review of Systems  Constitutional: Negative.   HENT: Negative.    Eyes: Negative.   Respiratory: Negative.     Cardiovascular: Negative.   Gastrointestinal: Negative.   Endocrine: Negative.   Genitourinary: Negative.   Musculoskeletal: Negative.   Skin: Negative.   Allergic/Immunologic: Negative.   Neurological: Negative.   Hematological: Negative.   Psychiatric/Behavioral: Negative.    All other systems reviewed and are negative.    Objective:    Physical Exam Vitals reviewed.  Constitutional:      Appearance: Normal appearance.  HENT:     Mouth/Throat:     Mouth: Mucous membranes are moist.  Eyes:     Pupils: Pupils are equal, round, and reactive to light.  Neck:     Vascular: No carotid bruit.  Cardiovascular:     Rate and Rhythm: Normal rate and regular rhythm.     Pulses: Normal pulses.     Heart sounds: Normal heart sounds.  Pulmonary:     Effort: Pulmonary effort is normal.     Breath sounds: Normal breath sounds.  Abdominal:     General: Bowel sounds are normal.     Palpations: Abdomen is soft. There is no hepatomegaly, splenomegaly or mass.     Tenderness: There is no abdominal tenderness.     Hernia: No hernia is present.       Comments: Recent    yeast infection  Musculoskeletal:        General: No tenderness.     Cervical back: Neck supple.     Right lower leg: No edema.     Left lower leg: No edema.  Skin:    Findings: No rash.  Neurological:     Mental Status: She is alert and oriented to person, place, and time.     Motor: No weakness.  Psychiatric:        Mood and Affect: Mood and affect normal.        Behavior: Behavior normal.    BP (!) 145/69   Pulse 98   Ht 5' (1.524 m)   Wt 200 lb 14.4 oz (91.1 kg)   BMI 39.24 kg/m  Wt Readings from Last 3 Encounters:  08/13/21 200 lb 14.4 oz (91.1 kg)  06/12/21 197 lb 12.8 oz (89.7 kg)  10/26/20 195 lb 1.6 oz (88.5 kg)     Health Maintenance Due  Topic Date Due   Pneumonia Vaccine 23+ Years old (1 - PCV) Never done   FOOT EXAM  Never done   Hepatitis C Screening  Never done   DEXA SCAN  Never  done   COVID-19 Vaccine (4 - Booster for Pfizer series) 06/29/2021    There are  no preventive care reminders to display for this patient.  Lab Results  Component Value Date   TSH 1.85 03/27/2020   Lab Results  Component Value Date   WBC 6.0 03/27/2020   HGB 13.1 03/27/2020   HCT 42.2 03/27/2020   MCV 81.6 03/27/2020   PLT 189 03/27/2020   Lab Results  Component Value Date   NA 138 03/27/2020   K 4.7 03/27/2020   CO2 20 03/27/2020   GLUCOSE 166 (H) 03/27/2020   BUN 14 03/27/2020   CREATININE 0.72 03/27/2020   BILITOT 0.3 03/27/2020   ALKPHOS 92 10/06/2017   AST 30 03/27/2020   ALT 28 03/27/2020   PROT 7.2 03/27/2020   ALBUMIN 3.7 10/06/2017   CALCIUM 9.5 03/27/2020   ANIONGAP 12 03/16/2018   Lab Results  Component Value Date   CHOL 281 (H) 03/16/2018   Lab Results  Component Value Date   HDL 68 03/16/2018   Lab Results  Component Value Date   LDLCALC 136 (H) 03/16/2018   Lab Results  Component Value Date   TRIG 383 (H) 03/16/2018   Lab Results  Component Value Date   CHOLHDL 4.1 03/16/2018   Lab Results  Component Value Date   HGBA1C 7.2 (A) 08/13/2021      Assessment & Plan:   Problem List Items Addressed This Visit       Cardiovascular and Mediastinum   Essential hypertension    The following hypertensive lifestyle modification were recommended and discussed:  1. Limiting alcohol intake to less than 1 oz/day of ethanol:(24 oz of beer or 8 oz of wine or 2 oz of 100-proof whiskey). 2. Take baby ASA 81 mg daily. 3. Importance of regular aerobic exercise and losing weight. 4. Reduce dietary saturated fat and cholesterol intake for overall cardiovascular health. 5. Maintaining adequate dietary potassium, calcium, and magnesium intake. 6. Regular monitoring of the blood pressure. 7. Reduce sodium intake to less than 100 mmol/day (less than 2.3 gm of sodium or less than 6 gm of sodium choride)         Digestive   Cirrhosis of liver without  ascites (HCC)    Stable at the present time        Endocrine   Type 2 diabetes mellitus with hyperglycemia, without long-term current use of insulin ([place])    - The patient's blood sugar is labile on med. - The patient will continue the current treatment regimen.  - I encouraged the patient to regularly check blood sugar.  - I encouraged the patient to monitor diet. I encouraged the patient to eat low-carb and low-sugar to help prevent blood sugar spikes.  - I encouraged the patient to continue following their prescribed treatment plan for diabetes - I informed the patient to get help if blood sugar drops below 54mg /dL, or if suddenly have trouble thinking clearly or breathing.  Patient was advised to buy a book on diabetes from a local bookstore or from [place]  Patient should read 2 chapters every day to keep the motivation going, this is in addition to some of the materials we provided them from the office.  There are other resources on the Internet like YouTube and wilkipedia to get an education on the diabetes      Relevant Medications   Semaglutide, 1 MG/DOSE, 4 MG/3ML SOPN     Musculoskeletal and Integument   Panniculitis    Patient had recurrent panniculitis as well as recurrent candidiasis of the lower abdomen, she might benefit  from surgery for removal of the apronof skin which is hanging on the lower abdomen        Other   Burning with urination    UA does not show any nitrites      Relevant Orders   Microalbumin, urine (Completed)   Obesity (BMI 35.0-39.9 without comorbidity)    Behavioral modification strategies: increasing lean protein intake, decreasing simple carbohydrates, increasing vegetables, increasing water intake, decreasing eating out, no skipping meals, meal planning and cooking strategies, keeping healthy foods in the home and planning for success.      Relevant Medications   Semaglutide, 1 MG/DOSE, 4 MG/3ML SOPN   Candidiasis    Patient has an  intermittent candidiasis and panniculitis of the lower abdominal skin.  Excessive fat is hanging over to the lower abdomen.  She has been treated in the past with Lotrisone cream antifungal powder with not good results.  If it continues to persist  It  will cause more problems which include infection or sepsis originating from that area.  Patient is referred to plastic surgeon for evaluation of that      Relevant Medications   clotrimazole-betamethasone (LOTRISONE) cream   Back pain    Stable      Other Visit Diagnoses     Type 2 diabetes mellitus without complication, without long-term current use of insulin ([place])    -  Primary   Relevant Medications   Semaglutide, 1 MG/DOSE, 4 MG/3ML SOPN   Other Relevant Orders   POCT glucose (manual entry) (Completed)   POCT glycosylated hemoglobin (Hb A1C) (Completed)   Microalbumin, urine (Completed)   UTI symptoms       Relevant Orders   POCT urinalysis dipstick (Completed)   Need for Tdap vaccination       Relevant Orders   Tdap vaccine greater than or equal to 7yo IM (Completed)       Meds ordered this encounter  Medications   clotrimazole-betamethasone (LOTRISONE) cream    Sig: Apply 1 application topically daily.    Dispense:  30 g    Refill:  4   Semaglutide, 1 MG/DOSE, 4 MG/3ML SOPN    Sig: Inject 1 mg as directed once a week.    Dispense:  3 mL    Refill:  12     Follow-up: No follow-ups on file.    Cletis Athens, MD

## 2021-08-13 NOTE — Telephone Encounter (Signed)
Called patient to go over next steps after her insurance denied her surgery authorization.Patient stated she has been seen for the rash underneath her pannus and it was prescribed by Dr. Juel Burrow. Advised her if we can show that she has been on the cream for at least 3 months with no improvement, we can appeal. She plans to call Dr. Iona Beard nurse to see if he will write a letter describing the treatment. She will have letter faxed to our office and give [place] a call back.

## 2021-08-13 NOTE — Assessment & Plan Note (Signed)
Stable

## 2021-08-13 NOTE — Assessment & Plan Note (Signed)
Patient has an intermittent candidiasis and panniculitis of the lower abdominal skin.  Excessive fat is hanging over to the lower abdomen.  She has been treated in the past with Lotrisone cream antifungal powder with not good results.  If it continues to persist  It  will cause more problems which include infection or sepsis originating from that area.  Patient is referred to plastic surgeon for evaluation of that

## 2021-08-14 ENCOUNTER — Other Ambulatory Visit: Payer: Self-pay

## 2021-08-14 ENCOUNTER — Encounter: Payer: Self-pay | Admitting: Internal Medicine

## 2021-08-14 LAB — MICROALBUMIN, URINE: Microalb, Ur: 0.5 mg/dL

## 2021-08-14 MED ORDER — SEMAGLUTIDE (1 MG/DOSE) 4 MG/3ML ~~LOC~~ SOPN
1.0000 mg | PEN_INJECTOR | SUBCUTANEOUS | 12 refills | Status: DC
  Filled 2021-08-14: qty 3, 28d supply, fill #0
  Filled 2021-09-15: qty 9, 84d supply, fill #1
  Filled 2021-12-18: qty 9, 84d supply, fill #2
  Filled 2022-03-12: qty 9, 84d supply, fill #3
  Filled 2022-06-11: qty 3, 28d supply, fill #4
  Filled 2022-07-16: qty 3, 28d supply, fill #5
  Filled 2022-07-30: qty 3, 28d supply, fill #6

## 2021-08-14 MED ORDER — CLOTRIMAZOLE-BETAMETHASONE 1-0.05 % EX CREA
1.0000 "application " | TOPICAL_CREAM | Freq: Every day | CUTANEOUS | 4 refills | Status: DC
  Filled 2021-08-14: qty 30, 30d supply, fill #0
  Filled 2021-10-03: qty 30, 30d supply, fill #1

## 2021-08-14 NOTE — Assessment & Plan Note (Signed)

## 2021-08-14 NOTE — Assessment & Plan Note (Signed)

## 2021-08-14 NOTE — Assessment & Plan Note (Signed)
Behavioral modification strategies: increasing lean protein intake, decreasing simple carbohydrates, increasing vegetables, increasing water intake, decreasing eating out, no skipping meals, meal planning and cooking strategies, keeping healthy foods in the home and planning for success. 

## 2021-08-14 NOTE — Assessment & Plan Note (Signed)
Stable at the present time. 

## 2021-08-14 NOTE — Assessment & Plan Note (Signed)
Patient had recurrent panniculitis as well as recurrent candidiasis of the lower abdomen, she might benefit from surgery for removal of the apronof skin which is hanging on the lower abdomen

## 2021-08-17 ENCOUNTER — Other Ambulatory Visit: Payer: Self-pay

## 2021-08-17 MED FILL — Metformin HCl Tab ER 24HR 500 MG: ORAL | 30 days supply | Qty: 60 | Fill #1 | Status: AC

## 2021-08-20 ENCOUNTER — Other Ambulatory Visit: Payer: Self-pay | Admitting: Internal Medicine

## 2021-08-20 ENCOUNTER — Other Ambulatory Visit: Payer: Self-pay

## 2021-08-20 MED ORDER — ESTRADIOL 1 MG PO TABS
ORAL_TABLET | Freq: Every day | ORAL | 6 refills | Status: DC
  Filled 2021-08-20: qty 90, 90d supply, fill #0
  Filled 2021-11-22: qty 90, 90d supply, fill #1
  Filled 2022-02-22: qty 30, 30d supply, fill #2

## 2021-08-20 MED ORDER — VERAPAMIL HCL ER 200 MG PO CP24
1.0000 | ORAL_CAPSULE | Freq: Every day | ORAL | 4 refills | Status: DC
  Filled 2021-08-20: qty 90, 90d supply, fill #0
  Filled 2021-12-18: qty 90, 90d supply, fill #1
  Filled 2022-03-12 (×2): qty 90, 90d supply, fill #2

## 2021-08-21 ENCOUNTER — Other Ambulatory Visit: Payer: Self-pay

## 2021-08-21 ENCOUNTER — Ambulatory Visit
Admission: RE | Admit: 2021-08-21 | Discharge: 2021-08-21 | Disposition: A | Discharge status: Home / Self Care | Payer: 59 | Source: Ambulatory Visit | Attending: Internal Medicine | Admitting: Internal Medicine

## 2021-08-21 DIAGNOSIS — Z1231 Encounter for screening mammogram for malignant neoplasm of breast: Secondary | ICD-10-CM | POA: Diagnosis not present

## 2021-08-22 ENCOUNTER — Other Ambulatory Visit: Payer: Self-pay | Admitting: Gastroenterology

## 2021-08-22 DIAGNOSIS — K7460 Unspecified cirrhosis of liver: Secondary | ICD-10-CM

## 2021-08-22 DIAGNOSIS — K7581 Nonalcoholic steatohepatitis (NASH): Secondary | ICD-10-CM | POA: Diagnosis not present

## 2021-09-04 ENCOUNTER — Encounter: Payer: Self-pay | Admitting: Internal Medicine

## 2021-09-04 ENCOUNTER — Other Ambulatory Visit: Payer: Self-pay

## 2021-09-04 ENCOUNTER — Other Ambulatory Visit: Payer: Self-pay | Admitting: *Deleted

## 2021-09-04 DIAGNOSIS — M793 Panniculitis, unspecified: Secondary | ICD-10-CM

## 2021-09-05 ENCOUNTER — Other Ambulatory Visit: Payer: Self-pay

## 2021-09-05 MED ORDER — CELECOXIB 200 MG PO CAPS
ORAL_CAPSULE | ORAL | 2 refills | Status: DC
  Filled 2021-09-05: qty 30, 30d supply, fill #0
  Filled 2021-10-19: qty 30, 30d supply, fill #1

## 2021-09-13 ENCOUNTER — Telehealth: Payer: Self-pay | Admitting: Plastic Surgery

## 2021-09-13 NOTE — Telephone Encounter (Signed)
Returned patient's call regarding records from Dr. Fredna Dow office regarding treatment of rash under her pannus. I advised patient that there are no records in Epic prior to 2020 from his office. I did see the note from 08/14/21 about her rash and that he treated her in the past with the ointment. I advised her that I could use that note and try to appeal but that it might not be proof enough for them. Patient said she has pictures from 2020 of her rash and she will work on getting those to me over the next few days. Advised I will hold off until I receive those and we can try to submit those to insurance. IF it's not enough, patient understands she may have to continue to see Dr. Juel Burrow for treatment and documentation of treatment.

## 2021-09-16 ENCOUNTER — Other Ambulatory Visit: Payer: Self-pay

## 2021-09-17 ENCOUNTER — Other Ambulatory Visit: Payer: Self-pay

## 2021-09-17 DIAGNOSIS — E1165 Type 2 diabetes mellitus with hyperglycemia: Secondary | ICD-10-CM | POA: Diagnosis not present

## 2021-09-17 DIAGNOSIS — E669 Obesity, unspecified: Secondary | ICD-10-CM | POA: Diagnosis not present

## 2021-09-17 DIAGNOSIS — M25562 Pain in left knee: Secondary | ICD-10-CM | POA: Diagnosis not present

## 2021-09-17 DIAGNOSIS — M1712 Unilateral primary osteoarthritis, left knee: Secondary | ICD-10-CM | POA: Diagnosis not present

## 2021-09-17 MED ORDER — HYDROCODONE-ACETAMINOPHEN 5-325 MG PO TABS
ORAL_TABLET | ORAL | 0 refills | Status: DC
  Filled 2021-09-17: qty 30, 7d supply, fill #0

## 2021-09-18 ENCOUNTER — Other Ambulatory Visit: Payer: Self-pay

## 2021-10-02 ENCOUNTER — Other Ambulatory Visit: Payer: Self-pay

## 2021-10-02 DIAGNOSIS — G4700 Insomnia, unspecified: Secondary | ICD-10-CM | POA: Diagnosis not present

## 2021-10-02 DIAGNOSIS — F39 Unspecified mood [affective] disorder: Secondary | ICD-10-CM | POA: Diagnosis not present

## 2021-10-02 DIAGNOSIS — F4310 Post-traumatic stress disorder, unspecified: Secondary | ICD-10-CM | POA: Diagnosis not present

## 2021-10-02 DIAGNOSIS — F5081 Binge eating disorder: Secondary | ICD-10-CM | POA: Diagnosis not present

## 2021-10-02 MED ORDER — ASENAPINE MALEATE 5 MG SL SUBL
SUBLINGUAL_TABLET | SUBLINGUAL | 0 refills | Status: DC
  Filled 2021-10-02: qty 30, 30d supply, fill #0

## 2021-10-02 MED ORDER — VYVANSE 20 MG PO CAPS
ORAL_CAPSULE | Freq: Every day | ORAL | 0 refills | Status: DC
  Filled 2021-11-22: qty 90, 90d supply, fill #0

## 2021-10-03 ENCOUNTER — Other Ambulatory Visit: Payer: Self-pay | Admitting: Internal Medicine

## 2021-10-03 ENCOUNTER — Other Ambulatory Visit: Payer: Self-pay

## 2021-10-03 MED ORDER — LOSARTAN POTASSIUM 50 MG PO TABS
ORAL_TABLET | ORAL | 3 refills | Status: DC
  Filled 2021-10-03: qty 90, 90d supply, fill #0
  Filled 2021-11-22 – 2021-12-18 (×2): qty 90, 90d supply, fill #1
  Filled 2022-04-07: qty 90, 90d supply, fill #2
  Filled 2022-06-11: qty 90, 90d supply, fill #3

## 2021-10-03 MED ORDER — HYDROCHLOROTHIAZIDE 12.5 MG PO TABS
ORAL_TABLET | ORAL | 3 refills | Status: DC
  Filled 2021-10-03: qty 90, 90d supply, fill #0
  Filled 2022-02-01: qty 90, 90d supply, fill #1
  Filled 2022-04-28: qty 90, 90d supply, fill #2
  Filled 2022-07-30: qty 90, 90d supply, fill #3

## 2021-10-03 MED FILL — Metformin HCl Tab ER 24HR 500 MG: ORAL | 30 days supply | Qty: 60 | Fill #2 | Status: AC

## 2021-10-03 MED FILL — Sertraline HCl Tab 100 MG: ORAL | 90 days supply | Qty: 135 | Fill #0 | Status: AC

## 2021-10-12 ENCOUNTER — Other Ambulatory Visit: Payer: Self-pay

## 2021-10-12 ENCOUNTER — Ambulatory Visit (INDEPENDENT_AMBULATORY_CARE_PROVIDER_SITE_OTHER): Payer: 59 | Admitting: *Deleted

## 2021-10-12 DIAGNOSIS — B379 Candidiasis, unspecified: Secondary | ICD-10-CM | POA: Diagnosis not present

## 2021-10-12 DIAGNOSIS — M8580 Other specified disorders of bone density and structure, unspecified site: Secondary | ICD-10-CM

## 2021-10-12 DIAGNOSIS — Z0000 Encounter for general adult medical examination without abnormal findings: Secondary | ICD-10-CM | POA: Diagnosis not present

## 2021-10-12 MED ORDER — CLOTRIMAZOLE-BETAMETHASONE 1-0.05 % EX CREA
1.0000 "application " | TOPICAL_CREAM | Freq: Every day | CUTANEOUS | 4 refills | Status: DC
  Filled 2021-10-12 – 2021-11-22 (×2): qty 45, 45d supply, fill #0
  Filled 2021-12-18 – 2022-05-20 (×2): qty 45, 45d supply, fill #1
  Filled 2022-07-01: qty 45, 45d supply, fill #2
  Filled 2022-07-30: qty 45, 90d supply, fill #3

## 2021-10-12 NOTE — Progress Notes (Signed)
Subjective:   April Singleton is a 71 y.o. female who presents for an Initial Medicare Annual Wellness Visit.  I discussed the limitations of evaluation and management by telemedicine and the availability of in person appointments. Patient expressed understanding and agreed to proceed.   Visit performed using audio  Patient:home Provider:home   Review of Systems    Defer to provider Cardiac Risk Factors include: advanced age (>1men, >65 women)     Objective:    There were no vitals filed for this visit. There is no height or weight on file to calculate BMI.  Advanced Directives 10/06/2017  Does Patient Have a Medical Advance Directive? No  Would patient like information on creating a medical advance directive? No - Patient declined    Current Medications (verified) Outpatient Encounter Medications as of 10/12/2021  Medication Sig   asenapine (SAPHRIS) 5 MG SUBL 24 hr tablet Take 1 at bedtime   celecoxib (CELEBREX) 200 MG capsule Take 1 capsule (200 mg total) by mouth once daily for 30 days   cholestyramine (QUESTRAN) 4 GM/DOSE powder SMARTSIG:1 Each By Mouth Twice Daily   clotrimazole-betamethasone (LOTRISONE) cream Apply 1 application topically daily.   estradiol (ESTRACE) 1 MG tablet TAKE 1 TABLET (1 MG TOTAL) BY MOUTH DAILY.   glucose blood test strip USE AS INSTRUCTED   hydrochlorothiazide (HYDRODIURIL) 12.5 MG tablet TAKE 1 TABLET BY MOUTH ONCE DAILY WITH LOSARTAN   HYDROcodone-acetaminophen (NORCO/VICODIN) 5-325 MG tablet Take 1 tablet by mouth every 6 (six) hours as needed for Pain for up to 30 doses   hydrOXYzine (VISTARIL) 25 MG capsule TAKE 1 CAPSULE BY MOUTH ONCE DAILY AS NEEDED   lisdexamfetamine (VYVANSE) 20 MG capsule Take 1 daily   losartan (COZAAR) 50 MG tablet TAKE 1 TABLET BY MOUTH DAILY WITH HCTZ   losartan-hydrochlorothiazide (HYZAAR) 50-12.5 MG tablet TAKE 1 TABLET BY MOUTH DAILY   metFORMIN (GLUCOPHAGE-XR) 500 MG 24 hr tablet TAKE 1 TABLET BY MOUTH  TWICE DAILY   pantoprazole (PROTONIX) 40 MG tablet Take 1 tablet (40 mg total) by mouth as directed Take 30-45 minutes before breakfast   QUEtiapine (SEROQUEL) 50 MG tablet Take 1 tablet by mouth at bedtime   rosuvastatin (CRESTOR) 10 MG tablet Take 1 tablet (10 mg total) by mouth daily.   Semaglutide, 1 MG/DOSE, 4 MG/3ML SOPN Inject 1 mg as directed once a week.   sertraline (ZOLOFT) 100 MG tablet TAKE 1 AND 1/2 TABLET BY MOUTH DAILY WITH BREAKFAST   tiZANidine (ZANAFLEX) 4 MG tablet TAKE 1 TABLET BY MOUTH 3 TIMES DAILY   Verapamil HCl CR 200 MG CP24 TAKE 1 CAPSULE BY MOUTH ONCE DAILY   VYVANSE 20 MG capsule Take 20 mg by mouth at bedtime.   zolpidem (AMBIEN) 10 MG tablet Take 10 mg by mouth at bedtime as needed.   [DISCONTINUED] clotrimazole-betamethasone (LOTRISONE) cream Apply 1 application topically daily.   No facility-administered encounter medications on file as of 10/12/2021.    Allergies (verified) Codeine, Penicillins, Prednisone, and Sulfa antibiotics   History: Past Medical History:  Diagnosis Date   Asthma    Diabetes mellitus without complication ([place])    Hypertension    Past Surgical History:  Procedure Laterality Date   ABDOMINAL HYSTERECTOMY     BREAST BIOPSY     Pt unsure of type of biopsy or even of which side   TONSILLECTOMY     Family History  Problem Relation Age of Onset   Liver cancer Father    Lung cancer  Father    Breast cancer Maternal Aunt    Social History   Socioeconomic History   Marital status: Married    Spouse name: Not on file   Number of children: Not on file   Years of education: Not on file   Highest education level: Not on file  Occupational History   Not on file  Tobacco Use   Smoking status: Never   Smokeless tobacco: Never  Vaping Use   Vaping Use: Never used  Substance and Sexual Activity   Alcohol use: Yes    Alcohol/week: 0.0 standard drinks   Drug use: Never   Sexual activity: Yes    Birth control/protection:  Surgical    Comment: Hysterectomy  Other Topics Concern   Not on file  Social History Narrative   Not on file   Social Determinants of Health   Financial Resource Strain: Low Risk    Difficulty of Paying Living Expenses: Not hard at all  Food Insecurity: No Food Insecurity   Worried About Charity fundraiser in the Last Year: Never true   [place] in the Last Year: Never true  Transportation Needs: No Transportation Needs   Lack of Transportation (Medical): No   Lack of Transportation (Non-Medical): No  Physical Activity: Sufficiently Active   Days of Exercise per Week: 4 days   Minutes of Exercise per Session: 40 min  Stress: No Stress Concern Present   Feeling of Stress : Not at all  Social Connections: Socially Integrated   Frequency of Communication with Friends and Family: More than three times a week   Frequency of Social Gatherings with Friends and Family: More than three times a week   Attends Religious Services: More than 4 times per year   Active Member of [employer] or Organizations: Yes   Attends Music therapist: More than 4 times per year   Marital Status: Married    Tobacco Counseling Counseling given: Not Answered   Clinical Intake:  Pre-visit preparation completed: Yes  Pain : No/denies pain     Nutritional Risks: None Diabetes: Yes CBG done?: No Did pt. bring in CBG monitor from home?: No  How often do you need to have someone help you when you read instructions, pamphlets, or other written materials from your doctor or pharmacy?: 1 - Never What is the last grade level you completed in school?: college  Diabetic?yes  Interpreter Needed?: No  Information entered by :: Lacretia Nicks CMA   Activities of Daily Living In your present state of health, do you have any difficulty performing the following activities: 10/12/2021 08/13/2021  Hearing? N N  Vision? N N  Difficulty concentrating or making decisions? N N  Walking or  climbing stairs? N N  Dressing or bathing? N N  Doing errands, shopping? N N  Preparing Food and eating ? N -  Using the Toilet? N -  In the past six months, have you accidently leaked urine? N -  Do you have problems with loss of bowel control? N -  Managing your Medications? N -  Managing your Finances? N -  Housekeeping or managing your Housekeeping? N -  Some recent data might be hidden    Patient Care Team: Cletis Athens, MD as PCP - General (Internal Medicine)  Indicate any recent Medical Services you may have received from other than [facility] providers in the past year (date may be approximate).     Assessment:   This is a  routine wellness examination for [place].  Hearing/Vision screen No results found.  Dietary issues and exercise activities discussed: Current Exercise Habits: Home exercise routine, Type of exercise: walking, Time (Minutes): 40, Frequency (Times/Week): 4, Weekly Exercise (Minutes/Week): 160, Intensity: Mild, Exercise limited by: None identified   Goals Addressed   None    Depression Screen PHQ 2/9 Scores 10/12/2021 08/13/2021 03/27/2020  PHQ - 2 Score 0 0 0    Fall Risk Fall Risk  10/12/2021 08/13/2021 08/13/2021 03/27/2020  Falls in the past year? 1 0 - 1  Number falls in past yr: 0 0 0 0  Injury with Fall? 1 0 0 0  Risk for fall due to : History of fall(s) No Fall Risks No Fall Risks -  Follow up Falls evaluation completed Falls evaluation completed Falls evaluation completed -    FALL RISK PREVENTION PERTAINING TO THE HOME:  Any stairs in or around the home? Yes  If so, are there any without handrails? Yes  Home free of loose throw rugs in walkways, pet beds, electrical cords, etc? yes Adequate lighting in your home to reduce risk of falls? Yes   ASSISTIVE DEVICES UTILIZED TO PREVENT FALLS:  Life alert? No  Use of a cane, walker or w/c? No  Grab bars in the bathroom? No  Shower chair or bench in shower? No  Elevated toilet seat or a  handicapped toilet? No   TIMED UP AND GO:  Was the test performed? No .  Length of time to ambulate-na    Cognitive Function: MMSE - Mini Mental State Exam 10/12/2021  Not completed: Unable to complete     6CIT Screen 10/12/2021  What Year? 0 points  What month? 0 points  What time? 0 points  Count back from 20 0 points  Months in reverse 0 points  Repeat phrase 0 points  Total Score 0    Immunizations Immunization History  Administered Date(s) Administered   Influenza-Unspecified 07/02/2021   Moderna SARS-COV2 Booster Vaccination 05/04/2021   PFIZER(Purple Top)SARS-COV-2 Vaccination 09/21/2019, 10/12/2019, 07/14/2020   Tdap 08/13/2021   Zoster Recombinat (Shingrix) 05/08/2018, 05/08/2018, 10/09/2018, 10/09/2018    TDAP status: Up to date  Flu Vaccine status: Up to date  Pneumococcal vaccine status: Due, Education has been provided regarding the importance of this vaccine. Advised may receive this vaccine at local pharmacy or Health Dept. Aware to provide a copy of the vaccination record if obtained from local pharmacy or Health Dept. Verbalized acceptance and understanding.  Covid-19 vaccine status: Information provided on how to obtain vaccines.   Qualifies for Shingles Vaccine? yes  Zostavax completed Yes   Shingrix Completed?: Yes  Screening Tests Health Maintenance  Topic Date Due   Pneumonia Vaccine 23+ Years old (1 - PCV) Never done   FOOT EXAM  Never done   Hepatitis C Screening  Never done   DEXA SCAN  Never done   COVID-19 Vaccine (4 - Booster for Pfizer series) 06/29/2021   COLONOSCOPY (Pts 45-50yrs Insurance coverage will need to be confirmed)  08/13/2022 (Originally [birth year]/1997)   OPHTHALMOLOGY EXAM  11/21/2021   HEMOGLOBIN A1C  02/10/2022   MAMMOGRAM  08/22/2023   TETANUS/TDAP  08/14/2031   INFLUENZA VACCINE  Completed   Zoster Vaccines- Shingrix  Completed   HPV VACCINES  Aged Out    Health Maintenance  Health Maintenance Due  Topic Date  Due   Pneumonia Vaccine 56+ Years old (1 - PCV) Never done   FOOT EXAM  Never done  Hepatitis C Screening  Never done   DEXA SCAN  Never done   COVID-19 Vaccine (4 - Booster for Pfizer series) 06/29/2021    Declined  colonoscopy   Mammogram status: Completed 08/21/2021. Repeat every year  Bone Density status: Ordered today. Pt provided with contact info and advised to call to schedule appt.  Lung Cancer Screening: (Low Dose CT Chest recommended if Age 60-80 years, 30 pack-year currently smoking OR have quit w/in 15years.) does not qualify.   Lung Cancer Screening Referral: na  Additional Screening:  Hepatitis C Screening: does qualify; Complete   Vision Screening: Recommended annual ophthalmology exams for early detection of glaucoma and other disorders of the eye. Is the patient up to date with their annual eye exam?  Yes  Who is the provider or what is the name of the office in which the patient attends annual eye exams? [facility]  If pt is not established with a provider, would they like to be referred to a provider to establish care? No .   Dental Screening: Recommended annual dental exams for proper oral hygiene  Community Resource Referral / Chronic Care Management: CRR required this visit?  No   CCM required this visit?  No      Plan:     I have personally reviewed and noted the following in the patients chart:   Medical and social history Use of alcohol, tobacco or illicit drugs  Current medications and supplements including opioid prescriptions. Patient is not currently taking opioid prescriptions. Functional ability and status Nutritional status Physical activity Advanced directives List of other physicians Hospitalizations, surgeries, and ER visits in previous 12 months Vitals Screenings to include cognitive, depression, and falls Referrals and appointments  In addition, I have reviewed and discussed with patient certain preventive  protocols, quality metrics, and best practice recommendations. A written personalized care plan for preventive services as well as general preventive health recommendations were provided to patient.     Lacretia Nicks, [place]   10/12/2021   Nurse Notes:  Ms. Scheuer , Thank you for taking time to come for your Medicare Wellness Visit. I appreciate your ongoing commitment to your health goals. Please review the following plan we discussed and let me know if I can assist you in the future.   These are the goals we discussed:  Goals   None     This is a list of the screening recommended for you and due dates:  Health Maintenance  Topic Date Due   Pneumonia Vaccine (1 - PCV) Never done   Complete foot exam   Never done   Hepatitis C Screening: USPSTF Recommendation to screen - Ages 31-79 yo.  Never done   DEXA scan (bone density measurement)  Never done   COVID-19 Vaccine (4 - Booster for Pfizer series) 06/29/2021   Colon Cancer Screening  08/13/2022*   Eye exam for diabetics  11/21/2021   Hemoglobin A1C  02/10/2022   Mammogram  08/22/2023   Tetanus Vaccine  08/14/2031   Flu Shot  Completed   Zoster (Shingles) Vaccine  Completed   HPV Vaccine  Aged Out  *Topic was postponed. The date shown is not the original due date.

## 2021-10-12 NOTE — Progress Notes (Signed)
I have reviewed this visit and agree with the documentation.   

## 2021-10-18 ENCOUNTER — Encounter: Payer: Self-pay | Admitting: Plastic Surgery

## 2021-10-19 ENCOUNTER — Other Ambulatory Visit: Payer: Self-pay

## 2021-10-19 DIAGNOSIS — M1712 Unilateral primary osteoarthritis, left knee: Secondary | ICD-10-CM | POA: Diagnosis not present

## 2021-10-19 DIAGNOSIS — E1165 Type 2 diabetes mellitus with hyperglycemia: Secondary | ICD-10-CM | POA: Diagnosis not present

## 2021-10-22 ENCOUNTER — Ambulatory Visit: Payer: 59

## 2021-10-23 ENCOUNTER — Encounter: Payer: Self-pay | Admitting: Plastic Surgery

## 2021-10-23 ENCOUNTER — Other Ambulatory Visit: Payer: Self-pay

## 2021-10-23 DIAGNOSIS — F5081 Binge eating disorder: Secondary | ICD-10-CM | POA: Diagnosis not present

## 2021-10-23 DIAGNOSIS — F3181 Bipolar II disorder: Secondary | ICD-10-CM | POA: Diagnosis not present

## 2021-10-23 DIAGNOSIS — F4310 Post-traumatic stress disorder, unspecified: Secondary | ICD-10-CM | POA: Diagnosis not present

## 2021-10-23 DIAGNOSIS — G4700 Insomnia, unspecified: Secondary | ICD-10-CM | POA: Diagnosis not present

## 2021-10-23 MED ORDER — SERTRALINE HCL 100 MG PO TABS
ORAL_TABLET | ORAL | 1 refills | Status: DC
  Filled 2021-10-23 – 2021-12-18 (×2): qty 135, 90d supply, fill #0

## 2021-10-23 MED ORDER — ASENAPINE MALEATE 2.5 MG SL SUBL
SUBLINGUAL_TABLET | SUBLINGUAL | 0 refills | Status: DC
  Filled 2021-10-23: qty 90, 90d supply, fill #0

## 2021-10-23 MED ORDER — VYVANSE 20 MG PO CAPS: ORAL_CAPSULE | Freq: Every day | ORAL | 0 refills | Status: DC

## 2021-10-24 ENCOUNTER — Other Ambulatory Visit: Payer: Self-pay

## 2021-10-24 ENCOUNTER — Ambulatory Visit
Admission: RE | Admit: 2021-10-24 | Discharge: 2021-10-24 | Disposition: A | Discharge status: Home / Self Care | Payer: 59 | Source: Ambulatory Visit | Attending: Gastroenterology | Admitting: Gastroenterology

## 2021-10-24 ENCOUNTER — Telehealth: Payer: Self-pay

## 2021-10-24 DIAGNOSIS — K7460 Unspecified cirrhosis of liver: Secondary | ICD-10-CM | POA: Insufficient documentation

## 2021-10-24 DIAGNOSIS — Z1211 Encounter for screening for malignant neoplasm of colon: Secondary | ICD-10-CM

## 2021-10-24 DIAGNOSIS — K7581 Nonalcoholic steatohepatitis (NASH): Secondary | ICD-10-CM | POA: Insufficient documentation

## 2021-10-24 NOTE — Telephone Encounter (Signed)
CALLED PATIENT NO ANSWER LEFT VOICEMAIL FOR A CALL BACK ? ?

## 2021-10-25 ENCOUNTER — Telehealth: Payer: Self-pay

## 2021-10-25 NOTE — Telephone Encounter (Signed)
CALLED PATIENT NO ANSWER LEFT VOICEM[employer] FOR A CALL BACK °Letter sent °

## 2021-10-29 ENCOUNTER — Other Ambulatory Visit: Payer: Self-pay

## 2021-10-29 ENCOUNTER — Other Ambulatory Visit: Payer: 59

## 2021-10-29 ENCOUNTER — Ambulatory Visit (INDEPENDENT_AMBULATORY_CARE_PROVIDER_SITE_OTHER): Payer: 59 | Admitting: Internal Medicine

## 2021-10-29 ENCOUNTER — Encounter: Payer: Self-pay | Admitting: Internal Medicine

## 2021-10-29 VITALS — BP 142/83 | HR 88 | Ht 60.0 in | Wt 201.9 lb

## 2021-10-29 DIAGNOSIS — E669 Obesity, unspecified: Secondary | ICD-10-CM | POA: Diagnosis not present

## 2021-10-29 DIAGNOSIS — E785 Hyperlipidemia, unspecified: Secondary | ICD-10-CM | POA: Diagnosis not present

## 2021-10-29 DIAGNOSIS — E1165 Type 2 diabetes mellitus with hyperglycemia: Secondary | ICD-10-CM | POA: Diagnosis not present

## 2021-10-29 DIAGNOSIS — M1990 Unspecified osteoarthritis, unspecified site: Secondary | ICD-10-CM

## 2021-10-29 DIAGNOSIS — Z1159 Encounter for screening for other viral diseases: Secondary | ICD-10-CM | POA: Diagnosis not present

## 2021-10-29 DIAGNOSIS — I10 Essential (primary) hypertension: Secondary | ICD-10-CM

## 2021-10-29 DIAGNOSIS — M793 Panniculitis, unspecified: Secondary | ICD-10-CM | POA: Diagnosis not present

## 2021-10-29 DIAGNOSIS — K7460 Unspecified cirrhosis of liver: Secondary | ICD-10-CM | POA: Diagnosis not present

## 2021-10-29 MED ORDER — FLUCONAZOLE 150 MG PO TABS
150.0000 mg | ORAL_TABLET | Freq: Once | ORAL | 0 refills | Status: AC
  Filled 2021-10-29: qty 4, 4d supply, fill #0

## 2021-10-29 NOTE — Assessment & Plan Note (Signed)

## 2021-10-29 NOTE — Assessment & Plan Note (Signed)

## 2021-10-29 NOTE — Progress Notes (Signed)
Established Patient Office Visit  Subjective:  Patient ID: April Singleton, female    DOB: 1951-05-24  Age: 71 y.o. MRN: [id number]  CC:  Chief Complaint  Patient presents with   Rash    Patient is continuing to get rash under her stomach fold from where she has had weight loss.     Rash   April Singleton presents for check up,   Past Medical History:  Diagnosis Date   Asthma    Diabetes mellitus without complication ([place])    Hypertension     Past Surgical History:  Procedure Laterality Date   ABDOMINAL HYSTERECTOMY     BREAST BIOPSY     Pt unsure of type of biopsy or even of which side   TONSILLECTOMY      Family History  Problem Relation Age of Onset   Liver cancer Father    Lung cancer Father    Breast cancer Maternal Aunt     Social History   Socioeconomic History   Marital status: Married    Spouse name: Not on file   Number of children: Not on file   Years of education: Not on file   Highest education level: Not on file  Occupational History   Not on file  Tobacco Use   Smoking status: Never   Smokeless tobacco: Never  Vaping Use   Vaping Use: Never used  Substance and Sexual Activity   Alcohol use: Yes    Alcohol/week: 0.0 standard drinks   Drug use: Never   Sexual activity: Yes    Birth control/protection: Surgical    Comment: Hysterectomy  Other Topics Concern   Not on file  Social History Narrative   Not on file   Social Determinants of Health   Financial Resource Strain: Low Risk    Difficulty of Paying Living Expenses: Not hard at all  Food Insecurity: No Food Insecurity   Worried About Charity fundraiser in the Last Year: Never true   [place] in the Last Year: Never true  Transportation Needs: No Transportation Needs   Lack of Transportation (Medical): No   Lack of Transportation (Non-Medical): No  Physical Activity: Sufficiently Active   Days of Exercise per Week: 4 days   Minutes of Exercise per Session: 40 min   Stress: No Stress Concern Present   Feeling of Stress : Not at all  Social Connections: Socially Integrated   Frequency of Communication with Friends and Family: More than three times a week   Frequency of Social Gatherings with Friends and Family: More than three times a week   Attends Religious Services: More than 4 times per year   Active Member of [employer] or Organizations: Yes   Attends Music therapist: More than 4 times per year   Marital Status: Married  Human resources officer Violence: Not At Risk   Fear of Current or Ex-Partner: No   Emotionally Abused: No   Physically Abused: No   Sexually Abused: No     Current Outpatient Medications:    asenapine (SAPHRIS) 5 MG SUBL 24 hr tablet, Take 1 at bedtime, Disp: 30 tablet, Rfl: 0   Asenapine Maleate (SAPHRIS) 2.5 MG SUBL, Take 1 at bedtime, Disp: 90 tablet, Rfl: 0   celecoxib (CELEBREX) 200 MG capsule, Take 1 capsule (200 mg total) by mouth once daily for 30 days, Disp: 30 capsule, Rfl: 2   cholestyramine (QUESTRAN) 4 GM/DOSE powder, SMARTSIG:1 Each By Mouth Twice Daily, Disp: ,  Rfl:    clotrimazole-betamethasone (LOTRISONE) cream, Apply 1 application topically daily., Disp: 45 g, Rfl: 4   estradiol (ESTRACE) 1 MG tablet, TAKE 1 TABLET (1 MG TOTAL) BY MOUTH DAILY., Disp: 30 tablet, Rfl: 6   fluconazole (DIFLUCAN) 150 MG tablet, Take 1 tablet (150 mg total) by mouth once for 1 dose., Disp: 4 tablet, Rfl: 0   hydrochlorothiazide (HYDRODIURIL) 12.5 MG tablet, TAKE 1 TABLET BY MOUTH ONCE DAILY WITH LOSARTAN, Disp: 90 tablet, Rfl: 3   HYDROcodone-acetaminophen (NORCO/VICODIN) 5-325 MG tablet, Take 1 tablet by mouth every 6 (six) hours as needed for Pain for up to 30 doses, Disp: 30 tablet, Rfl: 0   lisdexamfetamine (VYVANSE) 20 MG capsule, Take 1 daily, Disp: 90 capsule, Rfl: 0   lisdexamfetamine (VYVANSE) 20 MG capsule, Take 1 daily, Disp: 90 capsule, Rfl: 0   losartan (COZAAR) 50 MG tablet, TAKE 1 TABLET BY MOUTH DAILY WITH HCTZ,  Disp: 90 tablet, Rfl: 3   losartan-hydrochlorothiazide (HYZAAR) 50-12.5 MG tablet, TAKE 1 TABLET BY MOUTH DAILY, Disp: 90 tablet, Rfl: 3   metFORMIN (GLUCOPHAGE-XR) 500 MG 24 hr tablet, TAKE 1 TABLET BY MOUTH TWICE DAILY, Disp: 60 tablet, Rfl: 6   pantoprazole (PROTONIX) 40 MG tablet, Take 1 tablet (40 mg total) by mouth as directed Take 30-45 minutes before breakfast, Disp: 30 tablet, Rfl: 6   QUEtiapine (SEROQUEL) 50 MG tablet, Take 1 tablet by mouth at bedtime, Disp: 90 tablet, Rfl: 0   rosuvastatin (CRESTOR) 10 MG tablet, Take 1 tablet (10 mg total) by mouth daily., Disp: 90 tablet, Rfl: 3   Semaglutide, 1 MG/DOSE, 4 MG/3ML SOPN, Inject 1 mg as directed once a week., Disp: 3 mL, Rfl: 12   sertraline (ZOLOFT) 100 MG tablet, Take 1 and 1/2 tablet po daily with breakfast, Disp: 135 tablet, Rfl: 1   tiZANidine (ZANAFLEX) 4 MG tablet, TAKE 1 TABLET BY MOUTH 3 TIMES DAILY, Disp: 90 tablet, Rfl: 1   Verapamil HCl CR 200 MG CP24, TAKE 1 CAPSULE BY MOUTH ONCE DAILY, Disp: 90 capsule, Rfl: 4   VYVANSE 20 MG capsule, Take 20 mg by mouth at bedtime., Disp: , Rfl:    zolpidem (AMBIEN) 10 MG tablet, Take 10 mg by mouth at bedtime as needed., Disp: , Rfl:    Allergies  Allergen Reactions   Codeine Other (See Comments)   Penicillins    Prednisone Other (See Comments)    Makes her violent    Sulfa Antibiotics Hives    ROS Review of Systems  Skin:  Positive for rash.     Objective:    Physical Exam Constitutional:      Appearance: She is obese.  Abdominal:     Palpations: Abdomen is soft.     Hernia: No hernia is present.     Comments: Patient has a rash in the upper abdomen also and minimal in the lower abdomen.  Rash is eczematous painful, patient is referred to the skin specialist.  Patient should be considered for the plastic surgery of the lowerabdomen.  To remove the excess skin and skin tissue which may be causing the problem with fungal infection    BP (!) 142/83    Pulse 88    Ht  5' (1.524 m)    Wt 201 lb 14.4 oz (91.6 kg)    BMI 39.43 kg/m  Wt Readings from Last 3 Encounters:  10/29/21 201 lb 14.4 oz (91.6 kg)  08/13/21 200 lb 14.4 oz (91.1 kg)  06/12/21 197 lb 12.8  oz (89.7 kg)     Health Maintenance Due  Topic Date Due   FOOT EXAM  Never done   Hepatitis C Screening  Never done   DEXA SCAN  Never done    There are no preventive care reminders to display for this patient.  Lab Results  Component Value Date   TSH 1.85 03/27/2020   Lab Results  Component Value Date   WBC 6.0 03/27/2020   HGB 13.1 03/27/2020   HCT 42.2 03/27/2020   MCV 81.6 03/27/2020   PLT 189 03/27/2020   Lab Results  Component Value Date   NA 138 03/27/2020   K 4.7 03/27/2020   CO2 20 03/27/2020   GLUCOSE 166 (H) 03/27/2020   BUN 14 03/27/2020   CREATININE 0.72 03/27/2020   BILITOT 0.3 03/27/2020   ALKPHOS 92 10/06/2017   AST 30 03/27/2020   ALT 28 03/27/2020   PROT 7.2 03/27/2020   ALBUMIN 3.7 10/06/2017   CALCIUM 9.5 03/27/2020   ANIONGAP 12 03/16/2018   Lab Results  Component Value Date   CHOL 281 (H) 03/16/2018   Lab Results  Component Value Date   HDL 68 03/16/2018   Lab Results  Component Value Date   LDLCALC 136 (H) 03/16/2018   Lab Results  Component Value Date   TRIG 383 (H) 03/16/2018   Lab Results  Component Value Date   CHOLHDL 4.1 03/16/2018   Lab Results  Component Value Date   HGBA1C 7.2 (A) 08/13/2021      Assessment & Plan:   Problem List Items Addressed This Visit       Cardiovascular and Mediastinum   Essential hypertension - Primary     Patient denies any chest pain or shortness of breath there is no history of palpitation or paroxysmal nocturnal dyspnea   patient was advised to follow low-salt low-cholesterol diet    ideally I want to keep systolic blood pressure below 130 mmHg, patient was asked to check blood pressure one times a week and give me a report on that.  Patient will be follow-up in 3 months  or earlier as  needed, patient will call me back for any change in the cardiovascular symptoms Patient was advised to buy a book from local bookstore concerning blood pressure and read several chapters  every day.  This will be supplemented by some of the material we will give him from the office.  Patient should also utilize other resources like YouTube and Internet to learn more about the blood pressure and the diet.      Relevant Orders   CBC with Differential/Platelet   COMPLETE METABOLIC PANEL WITH GFR     Digestive   Cirrhosis of liver without ascites (HCC)    Cirrhosis of the liver, no ascites was noted        Endocrine   Type 2 diabetes mellitus with hyperglycemia, without long-term current use of insulin ([place])    - The patient's blood sugar is labile on med. - The patient will continue the current treatment regimen.  - I encouraged the patient to regularly check blood sugar.  - I encouraged the patient to monitor diet. I encouraged the patient to eat low-carb and low-sugar to help prevent blood sugar spikes.  - I encouraged the patient to continue following their prescribed treatment plan for diabetes - I informed the patient to get help if blood sugar drops below 54mg /dL, or if suddenly have trouble thinking clearly or breathing.  Patient was advised  to buy a book on diabetes from a local bookstore or from [place]  Patient should read 2 chapters every day to keep the motivation going, this is in addition to some of the materials we provided them from the office.  There are other resources on the Internet like YouTube and wilkipedia to get an education on the diabetes      Relevant Orders   Hemoglobin A1c     Musculoskeletal and Integument   Arthritis   Relevant Orders   CBC with Differential/Platelet   Panniculitis    Patient was found to have a candidal infection in the lower abdomen and under the breast.,  She was started on Diflucan 150 mg/week for 4 weeks, she will be referred to  dermatologist and plastic surgeon for removal of excessive skin        Other   Obesity (BMI 35.0-39.9 without comorbidity)    - I encouraged the patient to lose weight.  - I educated them on making healthy dietary choices including eating more fruits and vegetables and less fried foods. - I encouraged the patient to exercise more, and educated on the benefits of exercise including weight loss, diabetes prevention, and hypertension prevention.   Dietary counseling with a registered dietician  Referral to a weight management support group (e.g. Weight Watchers, Overeaters Anonymous)  If your BMI is greater than 29 or you have gained more than 15 pounds you should work on weight loss.  Attend a healthy cooking class       Relevant Orders   TSH   Dyslipidemia    Hypercholesterolemia  I advised the patient to follow Mediterranean diet This diet is rich in fruits vegetables and whole grain, and This diet is also rich in fish and lean meat Patient should also eat a handful of almonds or walnuts daily Recent heart study indicated that average follow-up on this kind of diet reduces the cardiovascular mortality by 50 to 70%==      Relevant Orders   Lipid panel   Other Visit Diagnoses     Need for hepatitis C screening test       Relevant Orders   Hepatitis C Antibody       Meds ordered this encounter  Medications   fluconazole (DIFLUCAN) 150 MG tablet    Sig: Take 1 tablet (150 mg total) by mouth once for 1 dose.    Dispense:  4 tablet    Refill:  0    Take 1 tablet once weekly    Follow-up: No follow-ups on file.    Cletis [place], MD

## 2021-10-29 NOTE — Assessment & Plan Note (Signed)
Cirrhosis of the liver, no ascites was noted

## 2021-10-29 NOTE — Assessment & Plan Note (Signed)
Patient was found to have a candidal infection in the lower abdomen and under the breast.,  She was started on Diflucan 150 mg/week for 4 weeks, she will be referred to dermatologist and plastic surgeon for removal of excessive skin

## 2021-10-29 NOTE — Assessment & Plan Note (Signed)

## 2021-10-29 NOTE — Assessment & Plan Note (Signed)
Hypercholesterolemia  I advised the patient to follow Mediterranean diet This diet is rich in fruits vegetables and whole grain, and This diet is also rich in fish and lean meat Patient should also eat a handful of almonds or walnuts daily Recent heart study indicated that average follow-up on this kind of diet reduces the cardiovascular mortality by 50 to 70%== 

## 2021-10-30 LAB — LIPID PANEL
Cholesterol: 265 mg/dL — ABNORMAL HIGH (ref <200)
HDL: 77 mg/dL (ref >=50)
LDL Cholesterol (Calc): 155 mg/dL (calc) — ABNORMAL HIGH
Non-HDL Cholesterol (Calc): 188 mg/dL (calc) — ABNORMAL HIGH (ref <130)
Total CHOL/HDL Ratio: 3.4 (calc) (ref <5.0)
Triglycerides: 189 mg/dL — ABNORMAL HIGH (ref <150)

## 2021-10-30 LAB — COMPLETE METABOLIC PANEL WITH GFR
AG Ratio: 1.2 (calc) (ref 1.0–2.5)
ALT: 18 U/L (ref 6–29)
AST: 21 U/L (ref 10–35)
Albumin: 3.8 g/dL (ref 3.6–5.1)
Alkaline phosphatase (APISO): 79 U/L (ref 37–153)
BUN: 13 mg/dL (ref 7–25)
CO2: 30 mmol/L (ref 20–32)
Calcium: 9.5 mg/dL (ref 8.6–10.4)
Chloride: 104 mmol/L (ref 98–110)
Creat: 0.77 mg/dL (ref 0.50–1.05)
Globulin: 3.2 g/dL (calc) (ref 1.9–3.7)
Glucose, Bld: 102 mg/dL — ABNORMAL HIGH (ref 65–99)
Potassium: 4.5 mmol/L (ref 3.5–5.3)
Sodium: 139 mmol/L (ref 135–146)
Total Bilirubin: 0.4 mg/dL (ref 0.2–1.2)
Total Protein: 7 g/dL (ref 6.1–8.1)
eGFR: 83 mL/min/{1.73_m2} (ref >=60)

## 2021-10-30 LAB — CBC WITH DIFFERENTIAL/PLATELET
Absolute Monocytes: 530 cells/uL (ref 200–950)
Basophils Absolute: 40 cells/uL (ref 0–200)
Basophils Relative: 0.7 %
Eosinophils Absolute: 143 cells/uL (ref 15–500)
Eosinophils Relative: 2.5 %
HCT: 39 % (ref 35.0–45.0)
Hemoglobin: 12.3 g/dL (ref 11.7–15.5)
Lymphs Abs: 1425 cells/uL (ref 850–3900)
MCH: 25.3 pg — ABNORMAL LOW (ref 27.0–33.0)
MCHC: 31.5 g/dL — ABNORMAL LOW (ref 32.0–36.0)
MCV: 80.1 fL (ref 80.0–100.0)
MPV: 10.4 fL (ref 7.5–12.5)
Monocytes Relative: 9.3 %
Neutro Abs: 3563 cells/uL (ref 1500–7800)
Neutrophils Relative %: 62.5 %
Platelets: 219 10*3/uL (ref 140–400)
RBC: 4.87 10*6/uL (ref 3.80–5.10)
RDW: 17.1 % — ABNORMAL HIGH (ref 11.0–15.0)
Total Lymphocyte: 25 %
WBC: 5.7 10*3/uL (ref 3.8–10.8)

## 2021-10-30 LAB — HEMOGLOBIN A1C
Hgb A1c MFr Bld: 6.3 % of total Hgb — ABNORMAL HIGH (ref <5.7)
Mean Plasma Glucose: 134 mg/dL
eAG (mmol/L): 7.4 mmol/L

## 2021-10-30 LAB — TSH: TSH: 1.94 mIU/L (ref 0.40–4.50)

## 2021-10-30 LAB — HEPATITIS C ANTIBODY
Hepatitis C Ab: NONREACTIVE
SIGNAL TO CUT-OFF: <0.02 (ref <1.00)

## 2021-10-31 ENCOUNTER — Other Ambulatory Visit: Payer: Self-pay

## 2021-10-31 MED ORDER — CELECOXIB 200 MG PO CAPS
ORAL_CAPSULE | ORAL | 3 refills | Status: DC
  Filled 2021-10-31 (×2): qty 60, 30d supply, fill #0
  Filled 2021-12-18: qty 60, 30d supply, fill #1
  Filled 2022-01-21: qty 60, 30d supply, fill #2
  Filled 2022-04-28: qty 60, 30d supply, fill #3

## 2021-10-31 MED FILL — Metformin HCl Tab ER 24HR 500 MG: ORAL | 30 days supply | Qty: 60 | Fill #3 | Status: AC

## 2021-11-07 DIAGNOSIS — R509 Fever, unspecified: Secondary | ICD-10-CM | POA: Diagnosis not present

## 2021-11-07 DIAGNOSIS — M1712 Unilateral primary osteoarthritis, left knee: Secondary | ICD-10-CM | POA: Diagnosis not present

## 2021-11-07 DIAGNOSIS — R059 Cough, unspecified: Secondary | ICD-10-CM | POA: Diagnosis not present

## 2021-11-07 DIAGNOSIS — R051 Acute cough: Secondary | ICD-10-CM | POA: Diagnosis not present

## 2021-11-07 DIAGNOSIS — J45901 Unspecified asthma with (acute) exacerbation: Secondary | ICD-10-CM | POA: Diagnosis not present

## 2021-11-07 DIAGNOSIS — Z03818 Encounter for observation for suspected exposure to other biological agents ruled out: Secondary | ICD-10-CM | POA: Diagnosis not present

## 2021-11-07 DIAGNOSIS — R0602 Shortness of breath: Secondary | ICD-10-CM | POA: Diagnosis not present

## 2021-11-07 DIAGNOSIS — J0100 Acute maxillary sinusitis, unspecified: Secondary | ICD-10-CM | POA: Diagnosis not present

## 2021-11-08 ENCOUNTER — Other Ambulatory Visit: Payer: Self-pay

## 2021-11-08 MED ORDER — PROMETHAZINE-DM 6.25-15 MG/5ML PO SYRP
ORAL_SOLUTION | ORAL | 0 refills | Status: DC
  Filled 2021-11-08: qty 118, 6d supply, fill #0

## 2021-11-08 MED ORDER — ALBUTEROL SULFATE HFA 108 (90 BASE) MCG/ACT IN AERS
INHALATION_SPRAY | RESPIRATORY_TRACT | 0 refills | Status: DC
  Filled 2021-11-08: qty 18, 25d supply, fill #0

## 2021-11-08 MED ORDER — AMOXICILLIN-POT CLAVULANATE 875-125 MG PO TABS
ORAL_TABLET | ORAL | 0 refills | Status: DC
  Filled 2021-11-08: qty 14, 7d supply, fill #0

## 2021-11-08 MED ORDER — IPRATROPIUM BROMIDE 0.06 % NA SOLN
NASAL | 0 refills | Status: DC
  Filled 2021-11-08: qty 15, 30d supply, fill #0

## 2021-11-16 ENCOUNTER — Other Ambulatory Visit: Payer: Self-pay

## 2021-11-16 MED ORDER — TRAZODONE HCL 100 MG PO TABS
ORAL_TABLET | ORAL | 0 refills | Status: DC
  Filled 2021-11-16: qty 180, 90d supply, fill #0

## 2021-11-22 ENCOUNTER — Other Ambulatory Visit: Payer: Self-pay

## 2021-12-04 DIAGNOSIS — H35372 Puckering of macula, left eye: Secondary | ICD-10-CM | POA: Diagnosis not present

## 2021-12-04 LAB — HM DIABETES EYE EXAM

## 2021-12-18 ENCOUNTER — Other Ambulatory Visit: Payer: Self-pay

## 2021-12-18 DIAGNOSIS — M1712 Unilateral primary osteoarthritis, left knee: Secondary | ICD-10-CM | POA: Diagnosis not present

## 2021-12-18 MED FILL — Metformin HCl Tab ER 24HR 500 MG: ORAL | 30 days supply | Qty: 60 | Fill #4 | Status: AC

## 2021-12-19 ENCOUNTER — Other Ambulatory Visit: Payer: Self-pay

## 2021-12-24 ENCOUNTER — Telehealth: Payer: Self-pay

## 2021-12-24 NOTE — Telephone Encounter (Signed)
Spoke with patient and advised her pre-op date 02/08/2022 at 11:00 with Garret, PO 03/15/2022 9:15 with Dr. Ulice Bold, 2nd PO 03/29/22 10:20 with Gerre Pebbles. ?

## 2021-12-27 DIAGNOSIS — M1712 Unilateral primary osteoarthritis, left knee: Secondary | ICD-10-CM | POA: Diagnosis not present

## 2022-01-01 DIAGNOSIS — M1712 Unilateral primary osteoarthritis, left knee: Secondary | ICD-10-CM | POA: Diagnosis not present

## 2022-01-01 DIAGNOSIS — H35372 Puckering of macula, left eye: Secondary | ICD-10-CM | POA: Diagnosis not present

## 2022-01-08 ENCOUNTER — Encounter: Payer: Self-pay | Admitting: *Deleted

## 2022-01-10 DIAGNOSIS — M1712 Unilateral primary osteoarthritis, left knee: Secondary | ICD-10-CM | POA: Diagnosis not present

## 2022-01-15 DIAGNOSIS — M1712 Unilateral primary osteoarthritis, left knee: Secondary | ICD-10-CM | POA: Diagnosis not present

## 2022-01-16 ENCOUNTER — Other Ambulatory Visit: Payer: Self-pay

## 2022-01-16 DIAGNOSIS — F3181 Bipolar II disorder: Secondary | ICD-10-CM | POA: Diagnosis not present

## 2022-01-16 DIAGNOSIS — G4700 Insomnia, unspecified: Secondary | ICD-10-CM | POA: Diagnosis not present

## 2022-01-16 DIAGNOSIS — F4310 Post-traumatic stress disorder, unspecified: Secondary | ICD-10-CM | POA: Diagnosis not present

## 2022-01-16 DIAGNOSIS — F5081 Binge eating disorder: Secondary | ICD-10-CM | POA: Diagnosis not present

## 2022-01-16 MED ORDER — ASENAPINE MALEATE 2.5 MG SL SUBL
SUBLINGUAL_TABLET | SUBLINGUAL | 0 refills | Status: DC
  Filled 2022-01-16: qty 90, 90d supply, fill #0

## 2022-01-16 MED ORDER — VYVANSE 20 MG PO CAPS
ORAL_CAPSULE | Freq: Every day | ORAL | 0 refills | Status: DC
  Filled 2022-01-16 – 2022-02-22 (×2): qty 90, 90d supply, fill #0

## 2022-01-16 MED ORDER — SERTRALINE HCL 100 MG PO TABS
ORAL_TABLET | ORAL | 1 refills | Status: DC
  Filled 2022-01-16 – 2022-07-30 (×3): qty 135, 90d supply, fill #0

## 2022-01-17 ENCOUNTER — Other Ambulatory Visit: Payer: Self-pay

## 2022-01-21 ENCOUNTER — Other Ambulatory Visit: Payer: Self-pay

## 2022-01-21 MED FILL — Metformin HCl Tab ER 24HR 500 MG: ORAL | 30 days supply | Qty: 60 | Fill #5 | Status: AC

## 2022-02-01 ENCOUNTER — Other Ambulatory Visit: Payer: Self-pay

## 2022-02-01 MED ORDER — PANTOPRAZOLE SODIUM 40 MG PO TBEC
DELAYED_RELEASE_TABLET | ORAL | 6 refills | Status: DC
  Filled 2022-02-01: qty 90, 90d supply, fill #0
  Filled 2022-05-20: qty 90, 90d supply, fill #1
  Filled 2022-07-30: qty 30, 30d supply, fill #2

## 2022-02-05 DIAGNOSIS — M1712 Unilateral primary osteoarthritis, left knee: Secondary | ICD-10-CM | POA: Diagnosis not present

## 2022-02-05 DIAGNOSIS — F5081 Binge eating disorder: Secondary | ICD-10-CM | POA: Diagnosis not present

## 2022-02-05 DIAGNOSIS — F3181 Bipolar II disorder: Secondary | ICD-10-CM | POA: Diagnosis not present

## 2022-02-05 DIAGNOSIS — G4700 Insomnia, unspecified: Secondary | ICD-10-CM | POA: Diagnosis not present

## 2022-02-05 DIAGNOSIS — F4310 Post-traumatic stress disorder, unspecified: Secondary | ICD-10-CM | POA: Diagnosis not present

## 2022-02-05 NOTE — Progress Notes (Unsigned)
Patient ID: April Singleton, female    DOB: 02-Feb-1951, 71 y.o.   MRN: [id number]  No chief complaint on file.     ICD-10-CM   1. Panniculitis  M79.3        History of Present Illness: April Singleton is a 71 y.o.  female  with a history of large abdominal pannus.  She presents for preoperative evaluation for upcoming procedure, abdominoplasty, scheduled for 03/04/2022 with Dr. Ulice Bold.  The patient {HAS HAS [id number]} had problems with anesthesia.   Recent A1c obtained 10/29/2021 was 6.3.    Summary of Previous Visit: Patient was seen for initial consult on 06/12/2021.  At that time, discussed patient's infra pannus rashes as well as her chronic back discomfort in context of large breasts.  Discussed panniculectomy versus abdominoplasty versus breast reduction surgery.  After further discussion, decision was made to proceed with abdominoplasty.  Plan was that she would require updated A1c prior to surgery.  Job: ***  PMH Significant for: Type II DM with most recent A1c of 6.3, asthma, GERD, HTN, HLD, anxiety, and recurrent panniculitis.   Past Medical History: Allergies: Allergies  Allergen Reactions   Codeine Other (See Comments)   Penicillins    Prednisone Other (See Comments)    Makes her violent    Sulfa Antibiotics Hives    Current Medications:  Current Outpatient Medications:    albuterol (VENTOLIN HFA) 108 (90 Base) MCG/ACT inhaler, Inhale 2 inhalations into the lungs every 4 (four) hours as needed, Disp: 18 g, Rfl: 0   amoxicillin-clavulanate (AUGMENTIN) 875-125 MG tablet, Take 1 tablet (875 mg total) by mouth every 12 (twelve) hours for 7 days, Disp: 14 tablet, Rfl: 0   asenapine (SAPHRIS) 5 MG SUBL 24 hr tablet, Take 1 at bedtime, Disp: 30 tablet, Rfl: 0   Asenapine Maleate (SAPHRIS) 2.5 MG SUBL, Take 1 at bedtime, Disp: 90 tablet, Rfl: 0   celecoxib (CELEBREX) 200 MG capsule, Take 1 capsule (200 mg total) by mouth once daily for 30 days, Disp: 30 capsule,  Rfl: 2   celecoxib (CELEBREX) 200 MG capsule, Take 1 capsule (200 mg total) by mouth 2 (two) times daily, Disp: 60 capsule, Rfl: 3   cholestyramine (QUESTRAN) 4 GM/DOSE powder, SMARTSIG:1 Each By Mouth Twice Daily, Disp: , Rfl:    clotrimazole-betamethasone (LOTRISONE) cream, Apply 1 application topically daily., Disp: 45 g, Rfl: 4   estradiol (ESTRACE) 1 MG tablet, TAKE 1 TABLET (1 MG TOTAL) BY MOUTH DAILY., Disp: 30 tablet, Rfl: 6   hydrochlorothiazide (HYDRODIURIL) 12.5 MG tablet, TAKE 1 TABLET BY MOUTH ONCE DAILY WITH LOSARTAN, Disp: 90 tablet, Rfl: 3   HYDROcodone-acetaminophen (NORCO/VICODIN) 5-325 MG tablet, Take 1 tablet by mouth every 6 (six) hours as needed for Pain for up to 30 doses, Disp: 30 tablet, Rfl: 0   ipratropium (ATROVENT) 0.06 % nasal spray, Place 2 sprays into both nostrils 3 (three) times daily as needed for Rhinitis, Disp: 15 mL, Rfl: 0   lisdexamfetamine (VYVANSE) 20 MG capsule, Take 1 daily, Disp: 90 capsule, Rfl: 0   lisdexamfetamine (VYVANSE) 20 MG capsule, Take 1 daily, Disp: 90 capsule, Rfl: 0   lisdexamfetamine (VYVANSE) 20 MG capsule, Take 1 daily, Disp: 90 capsule, Rfl: 0   losartan (COZAAR) 50 MG tablet, TAKE 1 TABLET BY MOUTH DAILY WITH HCTZ, Disp: 90 tablet, Rfl: 3   losartan-hydrochlorothiazide (HYZAAR) 50-12.5 MG tablet, TAKE 1 TABLET BY MOUTH DAILY, Disp: 90 tablet, Rfl: 3   metFORMIN (GLUCOPHAGE-XR) 500 MG 24  hr tablet, TAKE 1 TABLET BY MOUTH TWICE DAILY, Disp: 60 tablet, Rfl: 6   pantoprazole (PROTONIX) 40 MG tablet, Take 1 tablet (40 mg total) by mouth as directed. Take 30-45 minutes before breakfast., Disp: 30 tablet, Rfl: 6   promethazine-dextromethorphan (PROMETHAZINE-DM) 6.25-15 MG/5ML syrup, Take 5 mLs by mouth every 6 (six) hours as needed, Disp: 118 mL, Rfl: 0   QUEtiapine (SEROQUEL) 50 MG tablet, Take 1 tablet by mouth at bedtime, Disp: 90 tablet, Rfl: 0   rosuvastatin (CRESTOR) 10 MG tablet, Take 1 tablet (10 mg total) by mouth daily., Disp: 90  tablet, Rfl: 3   Semaglutide, 1 MG/DOSE, 4 MG/3ML SOPN, Inject 1 mg as directed once a week., Disp: 3 mL, Rfl: 12   sertraline (ZOLOFT) 100 MG tablet, Take 1 and 1/2 tablet po daily with breakfast, Disp: 135 tablet, Rfl: 1   sertraline (ZOLOFT) 100 MG tablet, Take 1 and 1/2 tablet po daily with breakfast, Disp: 135 tablet, Rfl: 1   traZODone (DESYREL) 100 MG tablet, Can take 1-2 tablets po nightly as needed, Disp: 180 tablet, Rfl: 0   Verapamil HCl CR 200 MG CP24, TAKE 1 CAPSULE BY MOUTH ONCE DAILY, Disp: 90 capsule, Rfl: 4   VYVANSE 20 MG capsule, Take 20 mg by mouth at bedtime., Disp: , Rfl:    zolpidem (AMBIEN) 10 MG tablet, Take 10 mg by mouth at bedtime as needed., Disp: , Rfl:   Past Medical Problems: Past Medical History:  Diagnosis Date   Asthma    Diabetes mellitus without complication (HCC)    Hypertension     Past Surgical History: Past Surgical History:  Procedure Laterality Date   ABDOMINAL HYSTERECTOMY     BREAST BIOPSY     Pt unsure of type of biopsy or even of which side   TONSILLECTOMY      Social History: Social History   Socioeconomic History   Marital status: Married    Spouse name: Not on file   Number of children: Not on file   Years of education: Not on file   Highest education level: Not on file  Occupational History   Not on file  Tobacco Use   Smoking status: Never   Smokeless tobacco: Never  Vaping Use   Vaping Use: Never used  Substance and Sexual Activity   Alcohol use: Yes    Alcohol/week: 0.0 standard drinks   Drug use: Never   Sexual activity: Yes    Birth control/protection: Surgical    Comment: Hysterectomy  Other Topics Concern   Not on file  Social History Narrative   Not on file   Social Determinants of Health   Financial Resource Strain: Low Risk    Difficulty of Paying Living Expenses: Not hard at all  Food Insecurity: No Food Insecurity   Worried About Programme researcher, broadcasting/film/video in the Last Year: Never true   Ran Out of Food  in the Last Year: Never true  Transportation Needs: No Transportation Needs   Lack of Transportation (Medical): No   Lack of Transportation (Non-Medical): No  Physical Activity: Sufficiently Active   Days of Exercise per Week: 4 days   Minutes of Exercise per Session: 40 min  Stress: No Stress Concern Present   Feeling of Stress : Not at all  Social Connections: Socially Integrated   Frequency of Communication with Friends and Family: More than three times a week   Frequency of Social Gatherings with Friends and Family: More than three times a week  Attends Religious Services: More than 4 times per year   Active Member of Clubs or Organizations: Yes   Attends Engineer, structural: More than 4 times per year   Marital Status: Married  Catering manager Violence: Not At Risk   Fear of Current or Ex-Partner: No   Emotionally Abused: No   Physically Abused: No   Sexually Abused: No    Family History: Family History  Problem Relation Age of Onset   Liver cancer Father    Lung cancer Father    Breast cancer Maternal Aunt     Review of Systems: ROS  Physical Exam: Vital Signs There were no vitals taken for this visit.  Physical Exam *** Constitutional:      General: Not in acute distress.    Appearance: Normal appearance. Not ill-appearing.  HENT:     Head: Normocephalic and atraumatic.  Eyes:     Pupils: Pupils are equal, round. Cardiovascular:     Rate and Rhythm: Normal rate.    Pulses: Normal pulses.  Pulmonary:     Effort: No respiratory distress or increased work of breathing.  Speaks in full sentences. Abdominal:     General: Abdomen is flat. No distension.   Musculoskeletal: Normal range of motion. No lower extremity swelling or edema. No varicosities. *** Skin:    General: Skin is warm and dry.     Findings: No erythema or rash.  Neurological:     Mental Status: Alert and oriented to person, place, and time.  Psychiatric:        Mood and Affect:  Mood normal.        Behavior: Behavior normal.    Assessment/Plan: The patient is scheduled for abdominoplasty with Dr. Ulice Bold.  Risks, benefits, and alternatives of procedure discussed, questions answered and consent obtained.    Smoking Status: ***; Counseling Given? ***  Caprini Score: ***; Risk Factors include: Age, BMI greater than 25, and length of planned surgery. Recommendation for mechanical *** pharmacological prophylaxis. Encourage early ambulation.   Pictures obtained: 06/12/2021  Post-op Rx sent to pharmacy: ***  Patient was provided with the General Surgical Risk consent document and Pain Medication Agreement prior to their appointment.  They had adequate time to read through the risk consent documents and Pain Medication Agreement. We also discussed them in person together during this preop appointment. All of their questions were answered to their satisfaction.  Recommended calling if they have any further questions.  Risk consent form and Pain Medication Agreement to be scanned into patient's chart.  The risk that can be encountered for this procedure were discussed and include the following but not limited to these: asymmetry, fluid accumulation, firmness of the tissue, skin loss, decrease or no sensation, fat necrosis, bleeding, infection, healing delay.  Deep vein thrombosis, cardiac and pulmonary complications are risks to any procedure.  There are risks of anesthesia, changes to skin sensation and injury to nerves or blood vessels.  The muscle can be temporarily or permanently injured.  You may have an allergic reaction to tape, suture, glue, blood products which can result in skin discoloration, swelling, pain, skin lesions, poor healing.  Any of these can lead to the need for revisonal surgery or stage procedures.  Weight gain and weigh loss can also effect the long term appearance. The results are not guaranteed to last a lifetime.  Future surgery may be required.       Electronically signed by: Evelena Leyden, PA-C 02/05/2022 9:09 AM

## 2022-02-08 ENCOUNTER — Encounter: Payer: Self-pay | Admitting: Physician Assistant

## 2022-02-08 ENCOUNTER — Ambulatory Visit (INDEPENDENT_AMBULATORY_CARE_PROVIDER_SITE_OTHER): Payer: 59 | Admitting: Physician Assistant

## 2022-02-08 ENCOUNTER — Other Ambulatory Visit: Payer: Self-pay

## 2022-02-08 ENCOUNTER — Ambulatory Visit: Payer: 59 | Admitting: Physician Assistant

## 2022-02-08 ENCOUNTER — Telehealth: Payer: Self-pay | Admitting: Plastic Surgery

## 2022-02-08 VITALS — BP 170/76 | HR 81 | Ht 60.0 in | Wt 198.0 lb

## 2022-02-08 DIAGNOSIS — M793 Panniculitis, unspecified: Secondary | ICD-10-CM

## 2022-02-08 DIAGNOSIS — N62 Hypertrophy of breast: Secondary | ICD-10-CM

## 2022-02-08 DIAGNOSIS — Z719 Counseling, unspecified: Secondary | ICD-10-CM

## 2022-02-08 MED ORDER — CEPHALEXIN 500 MG PO CAPS
500.0000 mg | ORAL_CAPSULE | Freq: Four times a day (QID) | ORAL | 0 refills | Status: AC
  Filled 2022-02-08: qty 12, 3d supply, fill #0

## 2022-02-08 MED ORDER — ONDANSETRON 4 MG PO TBDP
4.0000 mg | ORAL_TABLET | Freq: Three times a day (TID) | ORAL | 0 refills | Status: DC | PRN
  Filled 2022-02-08: qty 20, 7d supply, fill #0

## 2022-02-08 MED ORDER — HYDROCODONE-ACETAMINOPHEN 5-325 MG PO TABS
1.0000 | ORAL_TABLET | Freq: Four times a day (QID) | ORAL | 0 refills | Status: AC | PRN
  Filled 2022-02-08: qty 20, 5d supply, fill #0

## 2022-02-08 NOTE — Telephone Encounter (Signed)
Called patient to advise that I was able to update her authorization to reflect [place] and that her appts will remain the same as well as surgery date/time.  ?

## 2022-02-08 NOTE — Progress Notes (Addendum)
? ?  Patient ID: April Singleton, female    DOB: [birth year], 71 y.o.   MRN: [id number] ? ?Chief Complaint  ?Patient presents with  ? Pre-op Exam  ? ? ?  ICD-10-CM   ?1. Panniculitis  M79.3   ?  ? ? ? ?History of Present Illness: ?April Singleton is a 71 y.o.  female  with a history of large abdominal pannus and macromastia.  She presents for preoperative evaluation for upcoming procedure, abdominoplasty versus breast reduction, scheduled for 03/04/2022 with Dr. Marla Roe. ? ?The patient has not had problems with anesthesia.  She had PONV with previous hysterectomy decades ago, but no issues with anesthesia after gastric sleeve surgery approximately 7 to 8 years ago.  She denies any personal or family history of blood clots or clotting disorder.  No personal history of MI/CVA/cancer.  She does have scattered varicosities and spider veins, but no leg swelling.  She wears compressive stockings daily.  She will hold all vitamins and supplements 1 week prior to surgery.  She takes Celebrex daily for knee arthropathy, but will hold all NSAIDs 72 hours prior to surgery.  She takes 1 mg p.o. estrogen for migraine disorder which she will hold 2 weeks before and after surgery.  She will also hold her Vyvanse day of surgery.  She reports that her A1c has actually trended back up from 6.3 to 6.7, obtained by her primary care provider Dr. Lavera Guise.  We will obtain medical clearance for surgery given her multiple comorbidities.  She states that she has a hospital bed that will help with her staying in flex position while sleeping postoperatively.  We also discussed compressive garments. ? ?Summary of Previous Visit: Patient was seen for initial consult on 06/12/2021.  At that time, discussed patient's infra pannus rashes as well as her chronic back discomfort in context of large breasts.  Discussed panniculectomy versus abdominoplasty versus breast reduction surgery.  After further discussion, decision was made to proceed with  abdominoplasty.  Plan was that she would require updated A1c prior to surgery.  May pivot to breast reduction surgery instead.  It has been approved through her [employer] insurance until 2024. ? ?Job: Marine scientist at [employer], 6 weeks FMLA.  Completed. ? ?PMH Significant for: Type II DM with most recent A1c of 6.3, asthma, GERD, HTN, HLD, anxiety, and recurrent panniculitis. ? ? ?Past Medical History: ?Allergies: ?Allergies  ?Allergen Reactions  ? Codeine Other (See Comments)  ? Penicillins   ? Prednisone Other (See Comments)  ?  Makes her violent   ? Sulfa Antibiotics Hives  ? ? ?Current Medications: ? ?Current Outpatient Medications:  ?  Asenapine Maleate (SAPHRIS) 2.5 MG SUBL, Take 1 at bedtime, Disp: 90 tablet, Rfl: 0 ?  celecoxib (CELEBREX) 200 MG capsule, Take 1 capsule (200 mg total) by mouth 2 (two) times daily, Disp: 60 capsule, Rfl: 3 ?  clotrimazole-betamethasone (LOTRISONE) cream, Apply 1 application topically daily., Disp: 45 g, Rfl: 4 ?  estradiol (ESTRACE) 1 MG tablet, TAKE 1 TABLET (1 MG TOTAL) BY MOUTH DAILY., Disp: 30 tablet, Rfl: 6 ?  hydrochlorothiazide (HYDRODIURIL) 12.5 MG tablet, TAKE 1 TABLET BY MOUTH ONCE DAILY WITH LOSARTAN, Disp: 90 tablet, Rfl: 3 ?  ipratropium (ATROVENT) 0.06 % nasal spray, Place 2 sprays into both nostrils 3 (three) times daily as needed for Rhinitis, Disp: 15 mL, Rfl: 0 ?  lisdexamfetamine (VYVANSE) 20 MG capsule, Take 1 daily, Disp: 90 capsule, Rfl: 0 ?  lisdexamfetamine (VYVANSE) 20 MG capsule, Take  1 daily, Disp: 90 capsule, Rfl: 0 ?  lisdexamfetamine (VYVANSE) 20 MG capsule, Take 1 daily, Disp: 90 capsule, Rfl: 0 ?  losartan (COZAAR) 50 MG tablet, TAKE 1 TABLET BY MOUTH DAILY WITH HCTZ, Disp: 90 tablet, Rfl: 3 ?  losartan-hydrochlorothiazide (HYZAAR) 50-12.5 MG tablet, TAKE 1 TABLET BY MOUTH DAILY, Disp: 90 tablet, Rfl: 3 ?  metFORMIN (GLUCOPHAGE-XR) 500 MG 24 hr tablet, TAKE 1 TABLET BY MOUTH TWICE DAILY, Disp: 60 tablet, Rfl: 6 ?  pantoprazole (PROTONIX) 40 MG tablet, Take 1  tablet (40 mg total) by mouth as directed. Take 30-45 minutes before breakfast., Disp: 30 tablet, Rfl: 6 ?  QUEtiapine (SEROQUEL) 50 MG tablet, Take 1 tablet by mouth at bedtime, Disp: 90 tablet, Rfl: 0 ?  Semaglutide, 1 MG/DOSE, 4 MG/3ML SOPN, Inject 1 mg as directed once a week., Disp: 3 mL, Rfl: 12 ?  sertraline (ZOLOFT) 100 MG tablet, Take 1 and 1/2 tablet po daily with breakfast, Disp: 135 tablet, Rfl: 1 ?  Verapamil HCl CR 200 MG CP24, TAKE 1 CAPSULE BY MOUTH ONCE DAILY, Disp: 90 capsule, Rfl: 4 ? ?Past Medical Problems: ?Past Medical History:  ?Diagnosis Date  ? Asthma   ? Diabetes mellitus without complication ([place])   ? Hypertension   ? ? ?Past Surgical History: ?Past Surgical History:  ?Procedure Laterality Date  ? ABDOMINAL HYSTERECTOMY    ? BREAST BIOPSY    ? Pt unsure of type of biopsy or even of which side  ? TONSILLECTOMY    ? ? ?Social History: ?Social History  ? ?Socioeconomic History  ? Marital status: Married  ?  Spouse name: Not on file  ? Number of children: Not on file  ? Years of education: Not on file  ? Highest education level: Not on file  ?Occupational History  ? Not on file  ?Tobacco Use  ? Smoking status: Never  ? Smokeless tobacco: Never  ?Vaping Use  ? Vaping Use: Never used  ?Substance and Sexual Activity  ? Alcohol use: Yes  ?  Alcohol/week: 0.0 standard drinks  ? Drug use: Never  ? Sexual activity: Yes  ?  Birth control/protection: Surgical  ?  Comment: Hysterectomy  ?Other Topics Concern  ? Not on file  ?Social History Narrative  ? Not on file  ? ?Social Determinants of Health  ? ?Financial Resource Strain: Low Risk   ? Difficulty of Paying Living Expenses: Not hard at all  ?Food Insecurity: No Food Insecurity  ? Worried About Charity fundraiser in the Last Year: Never true  ? Ran Out of Food in the Last Year: Never true  ?Transportation Needs: No Transportation Needs  ? Lack of Transportation (Medical): No  ? Lack of Transportation (Non-Medical): No  ?Physical Activity:  Sufficiently Active  ? Days of Exercise per Week: 4 days  ? Minutes of Exercise per Session: 40 min  ?Stress: No Stress Concern Present  ? Feeling of Stress : Not at all  ?Social Connections: Socially Integrated  ? Frequency of Communication with Friends and Family: More than three times a week  ? Frequency of Social Gatherings with Friends and Family: More than three times a week  ? Attends Religious Services: More than 4 times per year  ? Active Member of Clubs or Organizations: Yes  ? Attends Archivist Meetings: More than 4 times per year  ? Marital Status: Married  ?Intimate Partner Violence: Not At Risk  ? Fear of Current or Ex-Partner: No  ?  Emotionally Abused: No  ? Physically Abused: No  ? Sexually Abused: No  ? ? ?Family History: ?Family History  ?Problem Relation Age of Onset  ? Liver cancer Father   ? Lung cancer Father   ? Breast cancer Maternal Aunt   ? ? ?Review of Systems: ?ROS ?Denies any recent chest pain, difficulty breathing, leg swelling, or fevers. ? ?Physical Exam: ?Vital Signs ?BP (!) 170/76 (BP Location: Left Arm, Patient Position: Sitting, Cuff Size: Large)   Pulse 81   Ht 5' (1.524 m)   Wt 198 lb (89.8 kg)   SpO2 97%   BMI 38.67 kg/m?  ? ?Physical Exam ?Constitutional:   ?   General: Not in acute distress. ?   Appearance: Normal appearance. Not ill-appearing.  ?HENT:  ?   Head: Normocephalic and atraumatic.  ?Eyes:  ?   Pupils: Pupils are equal, round. ?Cardiovascular:  ?   Rate and Rhythm: Normal rate. ?   Pulses: Normal pulses.  ?Pulmonary:  ?   Effort: No respiratory distress or increased work of breathing.  Speaks in full sentences. ?Abdominal:  ?   General: Abdomen is flat. No distension.   ?Musculoskeletal: Normal range of motion. No lower extremity swelling or edema.  Scattered varicosities and spider veins. ?Skin: ?   General: Skin is warm and dry.  ?   Findings: No erythema or rash.  ?Neurological:  ?   Mental Status: Alert and oriented to person, place, and time.   ?Psychiatric:     ?   Mood and Affect: Mood normal.     ?   Behavior: Behavior normal.  ? ? ?Assessment/Plan: ?The patient is scheduled for abdominoplasty or breast reduction with Dr. Marla Roe.  Risks, benefits, and alternat

## 2022-02-08 NOTE — Telephone Encounter (Signed)
Spoke with Vaughan Basta at [facility] KAT team regarding updating surgery location from [facility] to [facility]. She made the update. Patient called and advised.  ?

## 2022-02-10 DIAGNOSIS — M1712 Unilateral primary osteoarthritis, left knee: Secondary | ICD-10-CM | POA: Insufficient documentation

## 2022-02-12 ENCOUNTER — Encounter: Payer: Self-pay | Admitting: *Deleted

## 2022-02-18 DIAGNOSIS — M1712 Unilateral primary osteoarthritis, left knee: Secondary | ICD-10-CM | POA: Diagnosis not present

## 2022-02-22 ENCOUNTER — Telehealth: Payer: Self-pay

## 2022-02-22 ENCOUNTER — Other Ambulatory Visit: Payer: Self-pay

## 2022-02-22 NOTE — Telephone Encounter (Signed)
Received signed surgical clearance from Dr. Lavera Guise, but was not indicated if she is cleared or not. Called office, LMVM to have Dr Lavera Guise answer question if patient is cleared or not.

## 2022-02-26 ENCOUNTER — Other Ambulatory Visit: Payer: Self-pay

## 2022-03-04 DIAGNOSIS — M549 Dorsalgia, unspecified: Secondary | ICD-10-CM | POA: Diagnosis not present

## 2022-03-04 DIAGNOSIS — N6012 Diffuse cystic mastopathy of left breast: Secondary | ICD-10-CM | POA: Diagnosis not present

## 2022-03-04 DIAGNOSIS — N62 Hypertrophy of breast: Secondary | ICD-10-CM | POA: Diagnosis not present

## 2022-03-04 DIAGNOSIS — M542 Cervicalgia: Secondary | ICD-10-CM | POA: Diagnosis not present

## 2022-03-12 ENCOUNTER — Other Ambulatory Visit: Payer: Self-pay

## 2022-03-12 ENCOUNTER — Telehealth: Payer: Self-pay | Admitting: Plastic Surgery

## 2022-03-12 DIAGNOSIS — F5081 Binge eating disorder: Secondary | ICD-10-CM | POA: Diagnosis not present

## 2022-03-12 DIAGNOSIS — F3181 Bipolar II disorder: Secondary | ICD-10-CM | POA: Diagnosis not present

## 2022-03-12 DIAGNOSIS — F4310 Post-traumatic stress disorder, unspecified: Secondary | ICD-10-CM | POA: Diagnosis not present

## 2022-03-12 MED ORDER — VERAPAMIL HCL ER 240 MG PO CP24
240.0000 mg | ORAL_CAPSULE | Freq: Every day | ORAL | 3 refills | Status: DC
  Filled 2022-03-12: qty 90, 90d supply, fill #0
  Filled 2022-06-11: qty 90, 90d supply, fill #1
  Filled 2022-07-30 – 2022-08-29 (×5): qty 90, 90d supply, fill #2
  Filled 2022-12-12: qty 90, 90d supply, fill #3

## 2022-03-12 MED ORDER — DOXYCYCLINE HYCLATE 100 MG PO TABS
100.0000 mg | ORAL_TABLET | Freq: Two times a day (BID) | ORAL | 0 refills | Status: DC
  Filled 2022-03-12: qty 14, 7d supply, fill #0

## 2022-03-12 MED ORDER — DOXYCYCLINE HYCLATE 100 MG PO TABS: 100.0000 mg | ORAL_TABLET | Freq: Two times a day (BID) | ORAL | 0 refills | Status: AC

## 2022-03-12 NOTE — Telephone Encounter (Signed)
Concern for increase temp and red skin.  ABX sent in and patient to be seen this week.

## 2022-03-13 ENCOUNTER — Ambulatory Visit (INDEPENDENT_AMBULATORY_CARE_PROVIDER_SITE_OTHER): Payer: 59 | Admitting: Surgical

## 2022-03-13 ENCOUNTER — Other Ambulatory Visit: Payer: Self-pay

## 2022-03-13 ENCOUNTER — Encounter: Payer: Self-pay | Admitting: Surgical

## 2022-03-13 VITALS — BP 116/55 | HR 89 | Temp 97.5°F

## 2022-03-13 DIAGNOSIS — N62 Hypertrophy of breast: Secondary | ICD-10-CM

## 2022-03-13 DIAGNOSIS — E1165 Type 2 diabetes mellitus with hyperglycemia: Secondary | ICD-10-CM

## 2022-03-13 NOTE — Progress Notes (Signed)
Patient is a 71 year old female here for follow-up after bilateral breast reduction with Dr. Ulice Bold on 03/04/2022.  She is 9 days postop.  She presents today for concerns of infection.  Patient reports she spoke with Dr. Ulice Bold last night, prescribed doxycycline.  Patient reports she has had 2 doses of doxycycline, feels as if this has been helpful.  She reports that she has had some measured temperatures at home around 102, today in the office her temperature is 97.5 F. She reports that she has had poor p.o. intake of both liquids and food.  Reports she has had occasional nausea as well, but has not taken any of the Zofran that was prescribed for her postoperatively. She reports that she has been at home with her husband.  Denies any shortness of breath or chest pain.  She does feel fatigued after surgery.  Chaperone present on exam BP (!) 116/55 (BP Location: Left Arm, Patient Position: Sitting, Cuff Size: Large)   Pulse 89   Temp (!) 97.5 F (36.4 C) (Oral)   SpO2 96%  Breathing is unlabored, no acute distress.  She appears tired, is able to ambulate normally.  On exam bilateral NAC's are viable, bilateral breast incisions intact, appear to be healing well.  Bilateral JP drains with serosanguineous drainage in bulb, left with a slightly cloudy tinge and orange color.  It does not appear to be frank purulence, may be some fat necrosis.  Bilateral JP drains insertion sites with surrounding erythema but no cellulitic changes.  Bilateral breast without cellulitic changes, no subcutaneous fluid collections noted with palpation.  Bilateral lower extremities without any edema, no tenderness noted.  No erythema.  Assessment/plan 71 year old diabetic female presents for evaluation after breast reduction with Dr. Ulice Bold 9 days ago.  She seems to have had poor p.o. intake of both fluids and food.  She does not appear infected on exam, I suspect she is dehydrated and may even have low blood  sugar given her diabetic history.  We discussed evaluation in the emergency room versus increased p.o. intake of food and fluids at home and close monitoring.  Patient reports that she feels fine to go home, will be at home with her husband who has been helping her with recovery.  I feel comfortable with this given her vital signs are stable and that she is agreeable to strict ED precautions.  She does agree that if her symptoms change or worsen that she should be evaluated in the emergency room.  Strict precautions for evaluation in the ED were provided.  Recommend use of liquid IV to increase hydration. .  She is scheduled for follow-up in 2 days for reevaluation.  Recommend keeping this appointment.

## 2022-03-15 ENCOUNTER — Ambulatory Visit (INDEPENDENT_AMBULATORY_CARE_PROVIDER_SITE_OTHER): Payer: 59 | Admitting: Student

## 2022-03-15 ENCOUNTER — Other Ambulatory Visit: Payer: Self-pay

## 2022-03-15 ENCOUNTER — Encounter: Payer: Self-pay | Admitting: Student

## 2022-03-15 VITALS — BP 118/76 | HR 93 | Temp 98.5°F | Ht 60.0 in | Wt 196.4 lb

## 2022-03-15 DIAGNOSIS — N62 Hypertrophy of breast: Secondary | ICD-10-CM

## 2022-03-15 NOTE — Progress Notes (Signed)
Patient is a 71 year old female with history of macromastia.  Patient underwent bilateral breast reduction with Dr. Ulice Bold on 03/04/2022.  She is here today for postoperative follow-up.  Patient was last seen in the clinic on 03/13/2022.  At this visit, patient was concerned about a potential infection.  She was prescribed doxycycline night before her visit and was told to come in the next day.  Patient reported that she had poor p.o. intake of liquid and fluid.  She reported she had fevers of 102 F, but in the office her temperature was 97.5 F.  Vital signs were stable.  On exam, breathing was unlabored and patient was in no acute distress.  Her NAC's are viable bilaterally and incisions were intact and healing well.  There were no cellulitic changes.  JP drains bilaterally in place with serosanguineous drainage, with the left having a slightly cloudy tinge. Patient was advised to increase her intake of fluids and food.  Patient given strict ED precautions.  Today, patient reports she is okay but states she had 4-6 episodes of diarrhea.  She denies any blood in her stool.  She reports she has had some nausea and vomiting but took Zofran last night which helped.  She states that she has been drinking water and liquid IV. She reports that she has been voiding without issue.  Patient states that she feels like she felt warm at one point last night but did not take her temperature. She denies fevers today. She states she had an episode last night where she felt mildly short of breath.  She denies any shortness of breath or chest pain today. She denies any swelling in her legs. She reports she feels the drains have been pulling. She denies any other issues with her surgical sites.  She reports she has been keeping the area clean but she has been drying the area with a blow dryer.  Vitals:   03/15/22 0955  BP: 118/76  Pulse: 93  Temp: 98.5 F (36.9 C)  SpO2: 97%     Vital signs are stable. Temperature  98.5 F.  On exam, patient is sitting in a reclined position on the table in no acute distress.  Breathing is unlabored and she is in no respiratory distress.  She does appear slightly tired but is able to ambulate normally.  Incisions to breasts are intact bilaterally.  NAC's are viable bilaterally. Breasts are fairly symmetrical and soft bilaterally. There are no cellulitic changes to the skin. Drains are in place and functioning bilaterally. There is approximately 5cc of serosanguineous drainage in each bulb. The drain sites have some surrounding erythema but there are no cellulitic changes.   No edema or erythema to bilateral lower extremities. No tenderness upon palpation bilaterally. No pain with dorsiflexion of the foot bilaterally.   Discussed with the patient that she needs to drink plenty of fluids and stay hydrated. Discussed with patient she should drink plenty of water and can continue to drink the liquid IV.  Encouraged patient to make sure she is adequately eating.  Discussed with patient to discontinue doxycycline as this may be contributing to her diarrhea.  Discussed with patient that she should continue to take Zofran for nausea and vomiting.  Discussed with patient to not use a blow dryer to dry her incision sites as this may cause a burn. Discussed that she may shower and pat the area dry with a towel. Discussed with patient that she should call us if there are any  changes to her surgical site. Discussed with patient that if her symptoms change or worsen that she needs to go to the emergency room.    Patient reports that she feels fine to go home.  Patient acknowledged the strict ER precautions given to her today at this visit.  Patient to follow-up on Tuesday.  Instructed patient to call us over the weekend if she has any questions or concerns.  Objective findings and plan were discussed with Dr. Ulice Bold

## 2022-03-18 NOTE — Progress Notes (Unsigned)
Patient is a 71 year old female with history of macromastia.  Patient underwent bilateral breast reduction with Dr. Ulice Bold on 03/04/2022.  Patient presents for postoperative follow-up.  Patient was last seen in the clinic on 03/15/2022.  At this visit, patient reported she was having some nausea, vomiting and diarrhea.  She denied any fevers, chills, shortness of breath, chest pain or swelling in her legs at this visit.  On exam, her vitals were stable.  Her surgical sites were intact with no cellulitic changes to the skin.  Drains were in place bilaterally.  Patient's doxycycline was discontinued and patient instructed to continue to ensure she is drinking plenty of water and eating. Patient was given strict ER precautions and told to follow up with [place] today.   Today, patient reports she is doing much better.  She states she no longer has diarrhea.  She denies any nausea or vomiting.  She denies any fevers or chills.  She denies any shortness of breath.  Patient states that she is hydrating well and eating without issue.  Patient reports some redness in her breasts at the inframammary level.  She denies any drainage from the incisions.  She reports that her right drain has been draining approximately 8 to 12 cc/day and the left drain has been draining 15 to 20 cc/day.  BP (!) 148/78 (BP Location: Left Arm, Patient Position: Sitting, Cuff Size: Large)   Pulse 77   Temp 98.2 F (36.8 C)   SpO2 97%    Chaperone present on exam.  On exam, patient is sitting upright in no acute distress.  Her breathing is unlabored and she is in no respiratory distress.  Breasts are fairly symmetrical and soft bilaterally.  Her NAC's are viable bilaterally.  Incisions are intact bilaterally.  There is no drainage or swelling at the incisions.  There is some mild erythema along the inframammary incisions bilaterally and in between the inframammary incisions medially on the skin.  Drains are in place and functioning.  There  is approximately 5 cc of serosanguineous drainage in each bulb.  Drains were removed bilaterally.  Patient tolerated well.  Vaseline and gauze were placed over the drain sites.  Medihoney strips were placed on the inframammary incisions in the clinic today.  Supplied the patient with Medihoney strips and told her she may cut them in half and placed them on the inframammary incision every 1 to 2 days.  Discussed she can put vaseline and gauze daily to drain sites. Discussed with patient that she may transition to a compressive sports bra.  Also discussed with patient that her redness may be due to a potential yeast infection.  Ordered a one-time dose of fluconazole for her and recommended her to put over-the-counter antifungal cream on the skin in between the inframammary incisions but not on the incisions themselves.  Discussed with patient that she should avoid lifting greater than 10 pounds, avoid overhead motions and avoid strenuous activities.  Instructed patient to call if she has any questions or concerns.  Patient to follow-up next week.  Dr. Ulice Bold also examined the patient.  Plan discussed with Dr. Ulice Bold.

## 2022-03-19 ENCOUNTER — Ambulatory Visit (INDEPENDENT_AMBULATORY_CARE_PROVIDER_SITE_OTHER): Payer: 59 | Admitting: Student

## 2022-03-19 VITALS — BP 148/78 | HR 77 | Temp 98.2°F

## 2022-03-19 DIAGNOSIS — N62 Hypertrophy of breast: Secondary | ICD-10-CM

## 2022-03-19 MED ORDER — FLUCONAZOLE 150 MG PO TABS: 150.0000 mg | ORAL_TABLET | Freq: Once | ORAL | 0 refills | Status: AC

## 2022-03-23 ENCOUNTER — Encounter: Payer: Self-pay | Admitting: Emergency Medicine

## 2022-03-23 ENCOUNTER — Ambulatory Visit
Admission: EM | Admit: 2022-03-23 | Discharge: 2022-03-23 | Disposition: A | Discharge status: Home / Self Care | Payer: 59 | Attending: Family Medicine | Admitting: Family Medicine

## 2022-03-23 DIAGNOSIS — N3000 Acute cystitis without hematuria: Secondary | ICD-10-CM | POA: Insufficient documentation

## 2022-03-23 DIAGNOSIS — E1169 Type 2 diabetes mellitus with other specified complication: Secondary | ICD-10-CM

## 2022-03-23 DIAGNOSIS — R300 Dysuria: Secondary | ICD-10-CM | POA: Diagnosis not present

## 2022-03-23 LAB — POCT URINALYSIS DIP (MANUAL ENTRY)
Bilirubin, UA: NEGATIVE
Blood, UA: NEGATIVE
Glucose, UA: 100 mg/dL — AB
Ketones, POC UA: NEGATIVE mg/dL
Leukocytes, UA: NEGATIVE
Nitrite, UA: POSITIVE — AB
Spec Grav, UA: >=1.03 — AB (ref 1.010–1.025)
Urobilinogen, UA: 1 E.U./dL
pH, UA: 5 (ref 5.0–8.0)

## 2022-03-23 LAB — POCT FASTING CBG KUC MANUAL ENTRY: POCT Glucose (KUC): 114 mg/dL — AB (ref 70–99)

## 2022-03-23 MED ORDER — NITROFURANTOIN MONOHYD MACRO 100 MG PO CAPS: 100.0000 mg | ORAL_CAPSULE | Freq: Two times a day (BID) | ORAL | 0 refills | Status: AC

## 2022-03-23 NOTE — ED Provider Notes (Signed)
April Singleton    CSN: [id number] Arrival date & time: 03/23/22  1106      History   Chief Complaint Chief Complaint  Patient presents with   Urinary Frequency   Chills   Dysuria    HPI April Singleton is a 71 y.o. female.   HPI April Singleton is a 71 year old female,  with a history Diabetes, and recurrent dysuria, present today for evaluation of urinary frequency, urgency and dysuria x 3 days. She endorses left sided flank pain , fever, chills. She has history of UTI although not chronic. She is s/p breast reduction surgery. Reports she has followed up with surgeon and is experiencing no acute post surgical pain. Blood sugars were slightly elevated earlier in the week, but the last two days, blood sugar has been lower than normal.  Past Medical History:  Diagnosis Date   Asthma    Diabetes mellitus without complication (HCC)    Hypertension     Patient Active Problem List   Diagnosis Date Noted   Panniculitis 06/12/2021   Back pain 06/12/2021   Neck pain 06/12/2021   Primary osteoarthritis of right knee 10/26/2020   Dyslipidemia 07/24/2020   Arthritis 07/24/2020   Cirrhosis of liver without ascites (HCC) 07/24/2020   Candidiasis 04/27/2020   Diverticulitis 04/13/2020   Rotator cuff arthropathy of left shoulder 03/27/2020   Class 2 obesity due to excess calories without serious comorbidity with body mass index (BMI) of 39.0 to 39.9 in adult 03/27/2020   Annual physical exam 03/27/2020   Essential hypertension 03/27/2020   Candidal vaginitis 03/21/2020   Type 2 diabetes mellitus with hyperglycemia, without long-term current use of insulin (HCC) 03/15/2020   Burning with urination 03/15/2020   Obesity (BMI 35.0-39.9 without comorbidity) 03/15/2020    Past Surgical History:  Procedure Laterality Date   ABDOMINAL HYSTERECTOMY     BREAST BIOPSY     Pt unsure of type of biopsy or even of which side   TONSILLECTOMY      OB History     Gravida  3   Para   3   Term  3   Preterm      AB      Living  3      SAB      IAB      Ectopic      Multiple      Live Births  3            Home Medications    Prior to Admission medications   Medication Sig Start Date End Date Taking? Authorizing Provider  nitrofurantoin, macrocrystal-monohydrate, (MACROBID) 100 MG capsule Take 1 capsule (100 mg total) by mouth 2 (two) times daily for 5 days. 03/23/22 03/28/22 Yes Bing Neighbors, FNP  Asenapine Maleate (SAPHRIS) 2.5 MG SUBL Take 1 at bedtime 01/16/22     celecoxib (CELEBREX) 200 MG capsule Take 1 capsule (200 mg total) by mouth 2 (two) times daily 10/31/21     clotrimazole-betamethasone (LOTRISONE) cream Apply 1 application topically daily. 10/12/21   Corky Downs, MD  estradiol (ESTRACE) 1 MG tablet TAKE 1 TABLET (1 MG TOTAL) BY MOUTH DAILY. 08/20/21 08/20/22  Corky Downs, MD  hydrochlorothiazide (HYDRODIURIL) 12.5 MG tablet TAKE 1 TABLET BY MOUTH ONCE DAILY WITH LOSARTAN 10/03/21 10/03/22  Corky Downs, MD  ipratropium (ATROVENT) 0.06 % nasal spray Place 2 sprays into both nostrils 3 (three) times daily as needed for Rhinitis 11/07/21     lisdexamfetamine (  VYVANSE) 20 MG capsule Take 1 daily 10/02/21     lisdexamfetamine (VYVANSE) 20 MG capsule Take 1 daily 10/23/21     lisdexamfetamine (VYVANSE) 20 MG capsule Take 1 daily 01/16/22     losartan (COZAAR) 50 MG tablet TAKE 1 TABLET BY MOUTH DAILY WITH HCTZ 10/03/21 10/03/22  Corky Downs, MD  losartan-hydrochlorothiazide (HYZAAR) 50-12.5 MG tablet TAKE 1 TABLET BY MOUTH DAILY 10/02/20   Corky Downs, MD  metFORMIN (GLUCOPHAGE-XR) 500 MG 24 hr tablet TAKE 1 TABLET BY MOUTH TWICE DAILY 06/11/21   Corky Downs, MD  ondansetron (ZOFRAN-ODT) 4 MG disintegrating tablet Take 1 tablet (4 mg total) by mouth every 8 (eight) hours as needed for nausea or vomiting. 02/08/22   Lorelee New, PA-C  pantoprazole (PROTONIX) 40 MG tablet Take 1 tablet (40 mg total) by mouth as directed. Take 30-45 minutes before  breakfast. 02/01/22     Semaglutide, 1 MG/DOSE, 4 MG/3ML SOPN Inject 1 mg as directed once a week. 08/14/21   Corky Downs, MD  sertraline (ZOLOFT) 100 MG tablet Take 1 and 1/2 tablet po daily with breakfast 01/16/22     verapamil (VERELAN PM) 240 MG 24 hr capsule Take 1 capsule (240 mg total) by mouth daily. 03/12/22   Corky Downs, MD    Family History Family History  Problem Relation Age of Onset   Liver cancer Father    Lung cancer Father    Breast cancer Maternal Aunt     Social History Social History   Tobacco Use   Smoking status: Never   Smokeless tobacco: Never  Vaping Use   Vaping Use: Never used  Substance Use Topics   Alcohol use: Yes    Alcohol/week: 0.0 standard drinks of alcohol   Drug use: Never     Allergies   Codeine, Penicillins, Prednisone, and Sulfa antibiotics   Review of Systems Review of Systems Pertinent negatives listed in HPI   Physical Exam Triage Vital Signs ED Triage Vitals  Enc Vitals Group     BP 03/23/22 1128 125/82     Pulse Rate 03/23/22 1128 76     Resp 03/23/22 1128 16     Temp 03/23/22 1128 99 F (37.2 C)     Temp Source 03/23/22 1128 Oral     SpO2 03/23/22 1128 96 %     Weight --      Height --      Head Circumference --      Peak Flow --      Pain Score 03/23/22 1119 7     Pain Loc --      Pain Edu? --      Excl. in [place]? --    No data found.  Updated Vital Signs BP 125/82 (BP Location: Left Arm)   Pulse 76   Temp 99 F (37.2 C) (Oral)   Resp 16   SpO2 96%   Visual Acuity Right Eye Distance:   Left Eye Distance:   Bilateral Distance:    Right Eye Near:   Left Eye Near:    Bilateral Near:     Physical Exam General appearance: alert, well developed, well nourished, cooperative and in no distress Head: Normocephalic, without obvious abnormality, atraumatic Respiratory: Respirations even and unlabored, normal respiratory rate Heart: rate and rhythm normal.   CVA:  +left flank pain Extremities: No gross  deformities Skin: Skin color, texture, turgor normal. No rashes seen  Psych: Appropriate mood and affect.  UC Treatments / Results  Labs (all  labs ordered are listed, but only abnormal results are displayed) Labs Reviewed  POCT URINALYSIS DIP (MANUAL ENTRY) - Abnormal; Notable for the following components:      Result Value   Color, UA orange (*)    Clarity, UA cloudy (*)    Glucose, UA =100 (*)    Spec Grav, UA >=1.030 (*)    Protein Ur, POC trace (*)    Nitrite, UA Positive (*)    All other components within normal limits  POCT FASTING CBG [facility] MANUAL ENTRY - Abnormal; Notable for the following components:   POCT Glucose ([facility]) 114 (*)    All other components within normal limits  URINE CULTURE    EKG   Radiology No results found.  Procedures Procedures (including critical care time)  Medications Ordered in UC Medications - No data to display  Initial Impression / Assessment and Plan / UC Course  I have reviewed the triage vital signs and the nursing notes.  Pertinent labs & imaging results that were available during my care of the patient were reviewed by me and considered in my medical decision making (see chart for details).    Dysuria and acute cystitis UA compromised due to patient has been taking Pyridium.  However given symptoms will cover for acute cystitis with Macrobid twice daily for 5 days while awaiting urine culture.  Patient advised to continue to monitor blood sugars.  If at any point her symptoms worsen or does not readily improve follow-up with primary care provider return for evaluation Final Clinical Impressions(s) / UC Diagnoses   Final diagnoses:  Dysuria  Acute cystitis without hematuria     Discharge Instructions      Treating for possible UTI. Urine analysis is obscured due to AZO  medication. I will culture urine, and treat you for Macrobid  x 5 days. If any of your symptoms worsen do not improve, go immediately to the Emergency  Department.     ED Prescriptions     Medication Sig Dispense Auth. Provider   nitrofurantoin, macrocrystal-monohydrate, (MACROBID) 100 MG capsule Take 1 capsule (100 mg total) by mouth 2 (two) times daily for 5 days. 10 capsule Bing Neighbors, FNP      PDMP not reviewed this encounter.   Bing Neighbors, FNP 03/23/22 1312

## 2022-03-24 LAB — URINE CULTURE
Culture: 60000 — AB
Special Requests: NORMAL

## 2022-03-26 ENCOUNTER — Encounter: Payer: Self-pay | Admitting: Internal Medicine

## 2022-03-26 ENCOUNTER — Ambulatory Visit (INDEPENDENT_AMBULATORY_CARE_PROVIDER_SITE_OTHER): Payer: 59 | Admitting: Internal Medicine

## 2022-03-26 ENCOUNTER — Other Ambulatory Visit: Payer: Self-pay

## 2022-03-26 VITALS — BP 151/79 | HR 81 | Ht 60.0 in | Wt 194.1 lb

## 2022-03-26 DIAGNOSIS — Z6839 Body mass index (BMI) 39.0-39.9, adult: Secondary | ICD-10-CM

## 2022-03-26 DIAGNOSIS — R300 Dysuria: Secondary | ICD-10-CM

## 2022-03-26 DIAGNOSIS — E1165 Type 2 diabetes mellitus with hyperglycemia: Secondary | ICD-10-CM | POA: Diagnosis not present

## 2022-03-26 DIAGNOSIS — I10 Essential (primary) hypertension: Secondary | ICD-10-CM | POA: Diagnosis not present

## 2022-03-26 DIAGNOSIS — R350 Frequency of micturition: Secondary | ICD-10-CM | POA: Insufficient documentation

## 2022-03-26 DIAGNOSIS — E6609 Other obesity due to excess calories: Secondary | ICD-10-CM

## 2022-03-26 DIAGNOSIS — K7460 Unspecified cirrhosis of liver: Secondary | ICD-10-CM

## 2022-03-26 LAB — POCT URINALYSIS DIPSTICK
Appearance: NORMAL
Bilirubin, UA: NEGATIVE
Blood, UA: NEGATIVE
Glucose, UA: NEGATIVE
Ketones, UA: NEGATIVE
Leukocytes, UA: NEGATIVE
Nitrite, UA: NEGATIVE
Protein, UA: NEGATIVE
Spec Grav, UA: 1.015 (ref 1.010–1.025)
Urobilinogen, UA: 0.2 E.U./dL
pH, UA: 7.5 (ref 5.0–8.0)

## 2022-03-26 MED ORDER — CIPROFLOXACIN HCL 500 MG PO TABS
500.0000 mg | ORAL_TABLET | Freq: Two times a day (BID) | ORAL | 0 refills | Status: AC
  Filled 2022-03-26: qty 10, 5d supply, fill #0

## 2022-03-26 MED ORDER — ZOLPIDEM TARTRATE 5 MG PO TABS
ORAL_TABLET | ORAL | 0 refills | Status: DC
  Filled 2022-03-26: qty 30, 30d supply, fill #0

## 2022-03-26 MED ORDER — ZOLPIDEM TARTRATE 5 MG PO TABS: 5.0000 mg | ORAL_TABLET | Freq: Every evening | ORAL | 0 refills | Status: DC | PRN

## 2022-03-26 NOTE — Progress Notes (Signed)
Patient is a 71 year old female with PMH of macromastia s/p bilateral breast reduction performed 03/04/2022 Dr. Ulice Bold presents to clinic for postoperative follow-up.  She was last seen here in clinic on 03/19/2022.  At that time, her drains were removed and exam was largely reassuring.  Medihoney strips were placed on the inframammary incisions in clinic given erythema seen in folds on exam.  She was also prescribed one-time dose of fluconazole in the event that it was related to moisture and possibly fungal in etiology.  Today, patient is doing okay.  She states that she became ill after her most recent office encounter with UTI and has been seen on multiple occasions.  She was initially sent home with Macrobid, but it has since been changed to ciprofloxacin.  She reports that she still has 4 days of antibiotics.  She was having some fevers, chills, and fatigue, but is starting to feel better.  Regarding her breast reduction, she does have some residual stitches that are poking out and causing her discomfort.  She has been doing the Medihoney strips.  Overall, pleased with outcome of her reduction.  On physical exam, breasts have excellent shape and symmetry.  NAC's are viable.  1 cm wound at right inferior T-zone, relatively superficial.  Nondraining.  No surrounding erythema or induration.  No other significant wounds on exam.  Numerous sutures are removed here in clinic.  Residual Dermabond noted.  Advising patient to apply Vaseline daily to her incisions and to help dissolve the Dermabond around NAC's.  Her inframammary redness from previous encounter was largely resolved.  She did have a small area underneath the right breast, but suspected due to irritation.  No significant scaling or pruritus to suggest fungal today.  Do not think that she needs to continue nystatin ointment and instead simply use dry gauze if her inframammary area becomes particular moist.  Return in 3 weeks, sooner if needed.   At that time, can discuss any ongoing restrictions as well as scar mitigation options.  Picture(s) obtained of the patient and placed in the chart were with the patient's or guardian's permission.

## 2022-03-26 NOTE — Assessment & Plan Note (Signed)

## 2022-03-26 NOTE — Assessment & Plan Note (Signed)
Stable at the present time. 

## 2022-03-29 ENCOUNTER — Ambulatory Visit (INDEPENDENT_AMBULATORY_CARE_PROVIDER_SITE_OTHER): Payer: 59 | Admitting: Physician Assistant

## 2022-03-29 ENCOUNTER — Encounter: Payer: Self-pay | Admitting: Physician Assistant

## 2022-03-29 DIAGNOSIS — Z9889 Other specified postprocedural states: Secondary | ICD-10-CM

## 2022-03-29 DIAGNOSIS — N62 Hypertrophy of breast: Secondary | ICD-10-CM

## 2022-03-29 DIAGNOSIS — M1712 Unilateral primary osteoarthritis, left knee: Secondary | ICD-10-CM | POA: Diagnosis not present

## 2022-03-29 LAB — CBC WITH DIFFERENTIAL/PLATELET
Absolute Monocytes: 419 cells/uL (ref 200–950)
Basophils Absolute: 90 cells/uL (ref 0–200)
Basophils Relative: 1.7 %
Eosinophils Absolute: 180 cells/uL (ref 15–500)
Eosinophils Relative: 3.4 %
HCT: 37.1 % (ref 35.0–45.0)
Hemoglobin: 11.3 g/dL — ABNORMAL LOW (ref 11.7–15.5)
Lymphs Abs: 1251 cells/uL (ref 850–3900)
MCH: 24.4 pg — ABNORMAL LOW (ref 27.0–33.0)
MCHC: 30.5 g/dL — ABNORMAL LOW (ref 32.0–36.0)
MCV: 80 fL (ref 80.0–100.0)
MPV: 10.4 fL (ref 7.5–12.5)
Monocytes Relative: 7.9 %
Neutro Abs: 3360 cells/uL (ref 1500–7800)
Neutrophils Relative %: 63.4 %
Platelets: 339 10*3/uL (ref 140–400)
RBC: 4.64 10*6/uL (ref 3.80–5.10)
RDW: 15.4 % — ABNORMAL HIGH (ref 11.0–15.0)
Total Lymphocyte: 23.6 %
WBC: 5.3 10*3/uL (ref 3.8–10.8)

## 2022-03-29 LAB — ELECTROLYTE PANEL
CO2: 18 mmol/L — ABNORMAL LOW (ref 20–32)
Chloride: 95 mmol/L — ABNORMAL LOW (ref 98–110)
Sodium: 131 mmol/L — ABNORMAL LOW (ref 135–146)

## 2022-03-29 LAB — SPECIMEN COMPROMISED

## 2022-04-01 DIAGNOSIS — R309 Painful micturition, unspecified: Secondary | ICD-10-CM | POA: Diagnosis not present

## 2022-04-01 DIAGNOSIS — E119 Type 2 diabetes mellitus without complications: Secondary | ICD-10-CM | POA: Insufficient documentation

## 2022-04-01 DIAGNOSIS — K5730 Diverticulosis of large intestine without perforation or abscess without bleeding: Secondary | ICD-10-CM | POA: Diagnosis not present

## 2022-04-01 DIAGNOSIS — R350 Frequency of micturition: Secondary | ICD-10-CM | POA: Diagnosis not present

## 2022-04-01 DIAGNOSIS — R109 Unspecified abdominal pain: Secondary | ICD-10-CM | POA: Diagnosis not present

## 2022-04-01 DIAGNOSIS — Z87440 Personal history of urinary (tract) infections: Secondary | ICD-10-CM | POA: Diagnosis not present

## 2022-04-01 DIAGNOSIS — R1031 Right lower quadrant pain: Secondary | ICD-10-CM | POA: Diagnosis not present

## 2022-04-01 DIAGNOSIS — T1490XA Injury, unspecified, initial encounter: Secondary | ICD-10-CM | POA: Diagnosis not present

## 2022-04-01 DIAGNOSIS — K449 Diaphragmatic hernia without obstruction or gangrene: Secondary | ICD-10-CM | POA: Diagnosis not present

## 2022-04-01 DIAGNOSIS — K7460 Unspecified cirrhosis of liver: Secondary | ICD-10-CM | POA: Diagnosis not present

## 2022-04-01 DIAGNOSIS — N200 Calculus of kidney: Secondary | ICD-10-CM | POA: Diagnosis not present

## 2022-04-01 DIAGNOSIS — R300 Dysuria: Secondary | ICD-10-CM | POA: Diagnosis not present

## 2022-04-02 ENCOUNTER — Emergency Department: Payer: 59

## 2022-04-02 ENCOUNTER — Encounter: Payer: Self-pay | Admitting: Emergency Medicine

## 2022-04-02 ENCOUNTER — Emergency Department
Admission: EM | Admit: 2022-04-02 | Discharge: 2022-04-02 | Disposition: A | Discharge status: Home / Self Care | Payer: 59 | Attending: Emergency Medicine | Admitting: Emergency Medicine

## 2022-04-02 ENCOUNTER — Other Ambulatory Visit: Payer: Self-pay

## 2022-04-02 DIAGNOSIS — I10 Essential (primary) hypertension: Secondary | ICD-10-CM | POA: Insufficient documentation

## 2022-04-02 DIAGNOSIS — E119 Type 2 diabetes mellitus without complications: Secondary | ICD-10-CM | POA: Insufficient documentation

## 2022-04-02 DIAGNOSIS — Z87440 Personal history of urinary (tract) infections: Secondary | ICD-10-CM | POA: Diagnosis not present

## 2022-04-02 DIAGNOSIS — R1031 Right lower quadrant pain: Secondary | ICD-10-CM | POA: Diagnosis not present

## 2022-04-02 DIAGNOSIS — R309 Painful micturition, unspecified: Secondary | ICD-10-CM | POA: Diagnosis not present

## 2022-04-02 DIAGNOSIS — M25551 Pain in right hip: Secondary | ICD-10-CM | POA: Diagnosis not present

## 2022-04-02 DIAGNOSIS — R109 Unspecified abdominal pain: Secondary | ICD-10-CM | POA: Diagnosis not present

## 2022-04-02 DIAGNOSIS — N200 Calculus of kidney: Secondary | ICD-10-CM | POA: Diagnosis not present

## 2022-04-02 DIAGNOSIS — K5730 Diverticulosis of large intestine without perforation or abscess without bleeding: Secondary | ICD-10-CM | POA: Diagnosis not present

## 2022-04-02 DIAGNOSIS — R350 Frequency of micturition: Secondary | ICD-10-CM | POA: Diagnosis not present

## 2022-04-02 DIAGNOSIS — K449 Diaphragmatic hernia without obstruction or gangrene: Secondary | ICD-10-CM | POA: Diagnosis not present

## 2022-04-02 DIAGNOSIS — K7460 Unspecified cirrhosis of liver: Secondary | ICD-10-CM | POA: Diagnosis not present

## 2022-04-02 LAB — CBC WITH DIFFERENTIAL/PLATELET
Abs Immature Granulocytes: 0.04 10*3/uL (ref 0.00–0.07)
Basophils Absolute: 0 10*3/uL (ref 0.0–0.1)
Basophils Relative: 0 %
Eosinophils Absolute: 0.3 10*3/uL (ref 0.0–0.5)
Eosinophils Relative: 3 %
HCT: 32.3 % — ABNORMAL LOW (ref 36.0–46.0)
Hemoglobin: 9.9 g/dL — ABNORMAL LOW (ref 12.0–15.0)
Immature Granulocytes: 1 %
Lymphocytes Relative: 17 %
Lymphs Abs: 1.4 10*3/uL (ref 0.7–4.0)
MCH: 23.6 pg — ABNORMAL LOW (ref 26.0–34.0)
MCHC: 30.7 g/dL (ref 30.0–36.0)
MCV: 77.1 fL — ABNORMAL LOW (ref 80.0–100.0)
Monocytes Absolute: 0.9 10*3/uL (ref 0.1–1.0)
Monocytes Relative: 11 %
Neutro Abs: 5.5 10*3/uL (ref 1.7–7.7)
Neutrophils Relative %: 68 %
Platelets: 246 10*3/uL (ref 150–400)
RBC: 4.19 MIL/uL (ref 3.87–5.11)
RDW: 14.8 % (ref 11.5–15.5)
WBC: 8.1 10*3/uL (ref 4.0–10.5)
nRBC: 0 % (ref 0.0–0.2)

## 2022-04-02 LAB — URINALYSIS, ROUTINE W REFLEX MICROSCOPIC
Bacteria, UA: NONE SEEN
Bilirubin Urine: NEGATIVE
Glucose, UA: NEGATIVE mg/dL
Hgb urine dipstick: NEGATIVE
Ketones, ur: NEGATIVE mg/dL
Nitrite: NEGATIVE
Protein, ur: NEGATIVE mg/dL
Specific Gravity, Urine: 1.023 (ref 1.005–1.030)
pH: 6 (ref 5.0–8.0)

## 2022-04-02 LAB — BASIC METABOLIC PANEL
Anion gap: 7 (ref 5–15)
BUN: 11 mg/dL (ref 8–23)
CO2: 25 mmol/L (ref 22–32)
Calcium: 8.7 mg/dL — ABNORMAL LOW (ref 8.9–10.3)
Chloride: 103 mmol/L (ref 98–111)
Creatinine, Ser: 0.99 mg/dL (ref 0.44–1.00)
GFR, Estimated: >60 mL/min (ref >60)
Glucose, Bld: 123 mg/dL — ABNORMAL HIGH (ref 70–99)
Potassium: 3.3 mmol/L — ABNORMAL LOW (ref 3.5–5.1)
Sodium: 135 mmol/L (ref 135–145)

## 2022-04-02 MED ORDER — IOHEXOL 300 MG/ML  SOLN
100.0000 mL | Freq: Once | INTRAMUSCULAR | Status: AC | PRN
  Administered 2022-04-02: 100 mL via INTRAVENOUS

## 2022-04-02 MED ORDER — MORPHINE SULFATE (PF) 4 MG/ML IV SOLN
4.0000 mg | Freq: Once | INTRAVENOUS | Status: AC
  Administered 2022-04-02: 4 mg via INTRAVENOUS
  Filled 2022-04-02: qty 1

## 2022-04-02 MED ORDER — ACETAMINOPHEN 10 MG/ML IV SOLN
1000.0000 mg | Freq: Once | INTRAVENOUS | Status: AC
  Administered 2022-04-02: 1000 mg via INTRAVENOUS
  Filled 2022-04-02: qty 100

## 2022-04-02 NOTE — ED Provider Notes (Signed)
[facility] Provider Note    Event Date/Time   First MD Initiated Contact with Patient 04/02/22 1455     (approximate)   History   Hip Pain   HPI  April Singleton is a 71 y.o. female with a past medical history of obesity, diabetes, hypertension cirrhosis of the liver without ascites who presents today for evaluation of right hip pain.  Patient reports that she is due to get a knee surgery and she has been putting more weight on her affected extremity.  She has since developed pain in her right hip.  She reports that the pain radiates into her thigh.  She denies any falls or trauma.  She denies any belly pain, chest pain, or shortness of breath.  She denies dizziness.  She has not taken anything for pain.  She [place] but continues to have pain so she came to the emergency department today.  Patient Active Problem List   Diagnosis Date Noted   Urinary frequency 03/26/2022   Panniculitis 06/12/2021   Back pain 06/12/2021   Neck pain 06/12/2021   Primary osteoarthritis of right knee 10/26/2020   Dyslipidemia 07/24/2020   Arthritis 07/24/2020   Cirrhosis of liver without ascites ([place]) 07/24/2020   Candidiasis 04/27/2020   Diverticulitis 04/13/2020   Rotator cuff arthropathy of left shoulder 03/27/2020   Class 2 obesity due to excess calories without serious comorbidity with body mass index (BMI) of 39.0 to 39.9 in adult 03/27/2020   Annual physical exam 03/27/2020   Essential hypertension 03/27/2020   Candidal vaginitis 03/21/2020   Type 2 diabetes mellitus with hyperglycemia, without long-term current use of insulin ([place]) 03/15/2020   Burning with urination 03/15/2020   Obesity (BMI 35.0-39.9 without comorbidity) 03/15/2020          Physical Exam   Triage Vital Signs: ED Triage Vitals  Enc Vitals Group     BP 04/02/22 1348 (!) 112/55     Pulse Rate 04/02/22 1346 73      Resp 04/02/22 1346 16     Temp 04/02/22 1346 98.3 F (36.8 C)     Temp Source 04/02/22 1346 Oral     SpO2 04/02/22 1346 100 %     Weight 04/02/22 1343 194 lb (88 kg)     Height 04/02/22 1343 5' (1.524 m)     Head Circumference --      Peak Flow --      Pain Score 04/02/22 1343 10     Pain Loc --      Pain Edu? --      Excl. in [place]? --     Most recent vital signs: Vitals:   04/02/22 1346 04/02/22 1348  BP:  (!) 112/55  Pulse: 73   Resp: 16   Temp: 98.3 F (36.8 C)   SpO2: 100%     Physical Exam Vitals and nursing note reviewed.  Constitutional:      General: Awake and alert. No acute distress.    Appearance: Normal appearance. The patient is normal weight.  HENT:     Head: Normocephalic and atraumatic.     Mouth: Mucous membranes are moist.  Eyes:     General: PERRL. Normal EOMs        Right eye: No discharge.        Left eye: No discharge.     Conjunctiva/sclera: Conjunctivae normal.  Cardiovascular:  Rate and Rhythm: Normal rate and regular rhythm.     Pulses: Normal pulses.     Heart sounds: Normal heart sounds Pulmonary:     Effort: Pulmonary effort is normal. No respiratory distress.     Breath sounds: Normal breath sounds.  Abdominal:     Abdomen is soft. There is no abdominal tenderness. No rebound or guarding. No distention.  No ecchymosis Musculoskeletal:        General: No swelling. Normal range of motion.     Cervical back: Normal range of motion and neck supple.  Right hip: TTP to anterior hip without erythema, ecchymosis, or swelling. No tenderness along thigh/hamstring, no skin changes. Pain with active flexion/extension. Positive pain with log roll. Normal ROM at knee and ankle. No swelling. No pitting edema. Normal pedal pulses Back: No midline tenderness. Strength and sensation 5/5 to bilateral lower extremities. Normal great toe extension against resistance. Normal sensation throughout feet. Normal patellar reflexes. Negative SLR and opposite SLR  bilaterally.  Normal gait Skin:    General: Skin is warm and dry.     Capillary Refill: Capillary refill takes less than 2 seconds.     Findings: No rash.  Neurological:     Mental Status: The patient is awake and alert.      ED Results / Procedures / Treatments   Labs (all labs ordered are listed, but only abnormal results are displayed) Labs Reviewed  CBC WITH DIFFERENTIAL/PLATELET - Abnormal; Notable for the following components:      Result Value   Hemoglobin 9.9 (*)    HCT 32.3 (*)    MCV 77.1 (*)    MCH 23.6 (*)    All other components within normal limits  BASIC METABOLIC PANEL - Abnormal; Notable for the following components:   Potassium 3.3 (*)    Glucose, Bld 123 (*)    Calcium 8.7 (*)    All other components within normal limits  URINALYSIS, ROUTINE W REFLEX MICROSCOPIC - Abnormal; Notable for the following components:   Color, Urine YELLOW (*)    APPearance CLEAR (*)    Leukocytes,Ua SMALL (*)    All other components within normal limits     EKG     RADIOLOGY I independently reviewed and interpreted imaging and agree with radiologists findings.     PROCEDURES:  Critical Care performed:   Procedures   MEDICATIONS ORDERED IN ED: Medications  morphine (PF) 4 MG/ML injection 4 mg (4 mg Intravenous Given 04/02/22 1523)  acetaminophen (OFIRMEV) IV 1,000 mg (0 mg Intravenous Stopped 04/02/22 1616)  iohexol (OMNIPAQUE) 300 MG/ML solution 100 mL (100 mLs Intravenous Contrast Given 04/02/22 1613)     IMPRESSION / MDM / ASSESSMENT AND PLAN / ED COURSE  I reviewed the triage vital signs and the nursing notes.   Differential diagnosis includes, but is not limited to, degenerative joint disease, MSK, arthritis, fracture, dislocation, infection, kidney stone, intra-abdominal or pelvic pathology.  She is awake and alert, hemodynamically stable and afebrile.  She has pain with range of motion of her hip. No hematoma or ecchymosis to suggest psoas etiology. No  infectious signs or symptoms. Pulses are equal bilaterally, blood pressures are normal bilaterally, no abdominal tenderness, no back pain, dizziness, neurological abnormalities, and pulses are symmetrical in all 4 extremities, do not suspect dissection.  X-ray obtained at triage is normal without acute osseous findings.  Blood work is overall reassuring.  Denies black or bloody stool.  She reports that she is currently on an  antibiotic for urinary tract infection, reports urinary symptoms have improved.  CAT scan of her abdomen and pelvis obtained demonstrates constipation bilateral nephrolithiasis up to 3 mm, no ureteral stone.  No abdominal aorta or iliac aneurysm.  Urinalysis without evidence of UTI.  No CVA tenderness.  Upon reevaluation, patient reports that her symptoms have improved.  Her pain is easily reproducible with logroll and any movement of her hip.  She was given the information for orthopedics.  Discussed return precautions and importance of close outpatient follow-up.  She is able to ambulate with a steady gait, unassisted.  Patient feels comfortable with discharge home.  She was discharged with her husband.  Case was discussed with Dr. Marisa Severin who agrees with assessment and plan.   Patient's presentation is most consistent with acute complicated illness / injury requiring diagnostic workup.   Clinical Course as of 04/02/22 1818  Tue Apr 02, 2022  1659 Patient reports that she feels significantly improved [JP]    Clinical Course User Index [JP] Raza Bayless, Herb Grays, PA-C     FINAL CLINICAL IMPRESSION(S) / ED DIAGNOSES   Final diagnoses:  Right hip pain     Rx / DC Orders   ED Discharge Orders     None        Note:  This document was prepared using Dragon voice recognition software and may include unintentional dictation errors.   Keturah Shavers 04/02/22 1819    Dionne Bucy, MD 04/02/22 1926

## 2022-04-02 NOTE — Discharge Instructions (Addendum)
Your x-ray and CT scan are normal. Please follow up with orthopedics. Please return for new, worsneing, or change in symptoms or other concerns.

## 2022-04-02 NOTE — ED Notes (Signed)
Discharge paperwork provided and reviewed with patient. Followup and RX info reviewed as applicable. Pt provides verbal consent for dc at this time and declines vs at time of dc. pt to lobby alert and oriented. 

## 2022-04-02 NOTE — ED Triage Notes (Signed)
Pt via POV from home. Pt R hip pain that started yesterday. Pt denies any injury states that it happened all of sudden. Denies any previous hx of any surgeries on the R hip. States [place] and [place] a CT scan but it was normal. Pt is A&OX4 and NAD.

## 2022-04-02 NOTE — ED Notes (Signed)
First Nurse Note: Pt to ED via POV, pt states that she is having pain in her right hip. Pt states that she cannot hardly stand because the pain is so bad, pt is currently in NAD.

## 2022-04-03 ENCOUNTER — Telehealth: Payer: Self-pay | Admitting: Student

## 2022-04-03 MED ORDER — LIDOCAINE 5 % EX PTCH: 1.0000 | MEDICATED_PATCH | Freq: Two times a day (BID) | CUTANEOUS | 0 refills | Status: AC

## 2022-04-03 MED ORDER — NAPROXEN 500 MG PO TABS: 500.0000 mg | ORAL_TABLET | Freq: Two times a day (BID) | ORAL | 2 refills | Status: DC

## 2022-04-05 ENCOUNTER — Other Ambulatory Visit: Payer: Self-pay

## 2022-04-05 ENCOUNTER — Other Ambulatory Visit: Payer: Self-pay | Admitting: Internal Medicine

## 2022-04-05 ENCOUNTER — Ambulatory Visit (INDEPENDENT_AMBULATORY_CARE_PROVIDER_SITE_OTHER): Payer: 59 | Admitting: Nurse Practitioner

## 2022-04-05 ENCOUNTER — Encounter: Payer: Self-pay | Admitting: Nurse Practitioner

## 2022-04-05 VITALS — BP 149/85 | HR 80 | Ht 60.0 in | Wt 194.0 lb

## 2022-04-05 DIAGNOSIS — M7061 Trochanteric bursitis, right hip: Secondary | ICD-10-CM | POA: Diagnosis not present

## 2022-04-05 DIAGNOSIS — M25551 Pain in right hip: Secondary | ICD-10-CM | POA: Diagnosis not present

## 2022-04-05 DIAGNOSIS — E669 Obesity, unspecified: Secondary | ICD-10-CM | POA: Diagnosis not present

## 2022-04-05 DIAGNOSIS — M1611 Unilateral primary osteoarthritis, right hip: Secondary | ICD-10-CM | POA: Insufficient documentation

## 2022-04-05 DIAGNOSIS — M13851 Other specified arthritis, right hip: Secondary | ICD-10-CM | POA: Diagnosis not present

## 2022-04-05 MED ORDER — PREDNISONE 20 MG PO TABS
20.0000 mg | ORAL_TABLET | Freq: Every day | ORAL | 0 refills | Status: DC
  Filled 2022-04-05: qty 5, 5d supply, fill #0

## 2022-04-05 MED ORDER — HYDROCODONE-ACETAMINOPHEN 5-325 MG PO TABS
ORAL_TABLET | ORAL | 0 refills | Status: DC
  Filled 2022-04-05: qty 20, 5d supply, fill #0

## 2022-04-05 MED ORDER — ESTRADIOL 1 MG PO TABS
ORAL_TABLET | Freq: Every day | ORAL | 6 refills | Status: DC
  Filled 2022-04-05: qty 30, 30d supply, fill #0
  Filled 2022-04-28: qty 30, 30d supply, fill #1
  Filled 2022-06-11: qty 30, 30d supply, fill #2
  Filled 2022-07-01: qty 30, 30d supply, fill #3
  Filled 2022-07-30: qty 30, 30d supply, fill #4
  Filled 2022-08-29: qty 30, 30d supply, fill #5

## 2022-04-05 MED ORDER — TRAMADOL HCL 50 MG PO TABS
50.0000 mg | ORAL_TABLET | Freq: Two times a day (BID) | ORAL | 0 refills | Status: DC
  Filled 2022-04-05: qty 10, 5d supply, fill #0

## 2022-04-05 NOTE — Progress Notes (Signed)
Established Patient Office Visit  Subjective:  Patient ID: April Singleton, female    DOB: 12-11-1950  Age: 71 y.o. MRN: [id number]  CC:  Chief Complaint  Patient presents with   right hip pain    Patient has had right hip pain since Monday evening. She went to ED on 04/03/22 and had xray and ct done. Patient states that pain is a 10 on pain scale and is unable to walk or stand. Patient has apt with orthopedics scheduled for Tuesday next week.     HPI  April Singleton presents for Hip pain going on since last 4 days. She went to ED on Tuesday for the hip pain and has a appointment with [facility] on 11July, 2023. Patient state she had a fall on tuesaday at home and feel on her knee. She did not hit her head. Her right hip hurts when she move or change position.  Hip Pain  The incident occurred more than 1 week ago. The incident occurred at home. The injury mechanism was a fall (pain started all of the sudden). Quality: patient states the pain is agonizing. She state never had a pain like this before. The pain is at a severity of 10/10. The pain is severe. The pain has been Constant since onset. Associated symptoms include an inability to bear weight. Associated symptoms comments: Associated with moving.. The symptoms are aggravated by movement. She has tried NSAIDs and ice for the symptoms. The treatment provided no relief.     Past Medical History:  Diagnosis Date   Asthma    Diabetes mellitus without complication ([place])    Hypertension     Past Surgical History:  Procedure Laterality Date   ABDOMINAL HYSTERECTOMY     BREAST BIOPSY     Pt unsure of type of biopsy or even of which side   TONSILLECTOMY      Family History  Problem Relation Age of Onset   Liver cancer Father    Lung cancer Father    Breast cancer Maternal Aunt     Social History   Socioeconomic History   Marital status: Married    Spouse name: Not on file   Number of children: Not on file    Years of education: Not on file   Highest education level: Not on file  Occupational History   Not on file  Tobacco Use   Smoking status: Never   Smokeless tobacco: Never  Vaping Use   Vaping Use: Never used  Substance and Sexual Activity   Alcohol use: Yes    Alcohol/week: 0.0 standard drinks of alcohol   Drug use: Never   Sexual activity: Yes    Birth control/protection: Surgical    Comment: Hysterectomy  Other Topics Concern   Not on file  Social History Narrative   Not on file   Social Determinants of Health   Financial Resource Strain: Low Risk  (10/12/2021)   Overall Financial Resource Strain (CARDIA)    Difficulty of Paying Living Expenses: Not hard at all  Food Insecurity: No Food Insecurity (10/12/2021)   Hunger Vital Sign    Worried About Running Out of Food in the Last Year: Never true    Ran Out of Food in the Last Year: Never true  Transportation Needs: No Transportation Needs (10/12/2021)   PRAPARE - Hydrologist (Medical): No    Lack of Transportation (Non-Medical): No  Physical Activity: Sufficiently Active (10/12/2021)  Exercise Vital Sign    Days of Exercise per Week: 4 days    Minutes of Exercise per Session: 40 min  Stress: No Stress Concern Present (10/12/2021)   [place]    Feeling of Stress : Not at all  Social Connections: [place] (10/12/2021)   Social Connection and Isolation Panel [NHANES]    Frequency of Communication with Friends and Family: More than three times a week    Frequency of Social Gatherings with Friends and Family: More than three times a week    Attends Religious Services: More than 4 times per year    Active Member of [employer] or Organizations: Yes    Attends Music therapist: More than 4 times per year    Marital Status: Married  Human resources officer Violence: Not At Risk (10/12/2021)   Humiliation, Afraid, Rape, and  Kick questionnaire    Fear of Current or Ex-Partner: No    Emotionally Abused: No    Physically Abused: No    Sexually Abused: No     Current Outpatient Medications:    Asenapine Maleate (SAPHRIS) 2.5 MG SUBL, Take 1 at bedtime, Disp: 90 tablet, Rfl: 0   celecoxib (CELEBREX) 200 MG capsule, Take 1 capsule (200 mg total) by mouth 2 (two) times daily, Disp: 60 capsule, Rfl: 3   clotrimazole-betamethasone (LOTRISONE) cream, Apply 1 application topically daily., Disp: 45 g, Rfl: 4   hydrochlorothiazide (HYDRODIURIL) 12.5 MG tablet, TAKE 1 TABLET BY MOUTH ONCE DAILY WITH LOSARTAN, Disp: 90 tablet, Rfl: 3   ipratropium (ATROVENT) 0.06 % nasal spray, Place 2 sprays into both nostrils 3 (three) times daily as needed for Rhinitis, Disp: 15 mL, Rfl: 0   lidocaine (LIDODERM) 5 %, Place 1 patch onto the skin every 12 (twelve) hours for 5 days. Remove & Discard patch within 12 hours or as directed by MD, Disp: 10 patch, Rfl: 0   lisdexamfetamine (VYVANSE) 20 MG capsule, Take 1 daily, Disp: 90 capsule, Rfl: 0   lisdexamfetamine (VYVANSE) 20 MG capsule, Take 1 daily, Disp: 90 capsule, Rfl: 0   lisdexamfetamine (VYVANSE) 20 MG capsule, Take 1 daily, Disp: 90 capsule, Rfl: 0   losartan (COZAAR) 50 MG tablet, TAKE 1 TABLET BY MOUTH DAILY WITH HCTZ, Disp: 90 tablet, Rfl: 3   losartan-hydrochlorothiazide (HYZAAR) 50-12.5 MG tablet, TAKE 1 TABLET BY MOUTH DAILY, Disp: 90 tablet, Rfl: 3   metFORMIN (GLUCOPHAGE-XR) 500 MG 24 hr tablet, TAKE 1 TABLET BY MOUTH TWICE DAILY, Disp: 60 tablet, Rfl: 6   naproxen (NAPROSYN) 500 MG tablet, Take 1 tablet (500 mg total) by mouth 2 (two) times daily with a meal., Disp: 10 tablet, Rfl: 2   ondansetron (ZOFRAN-ODT) 4 MG disintegrating tablet, Take 1 tablet (4 mg total) by mouth every 8 (eight) hours as needed for nausea or vomiting., Disp: 20 tablet, Rfl: 0   pantoprazole (PROTONIX) 40 MG tablet, Take 1 tablet (40 mg total) by mouth as directed. Take 30-45 minutes before  breakfast., Disp: 30 tablet, Rfl: 6   predniSONE (DELTASONE) 20 MG tablet, Take 1 tablet (20 mg total) by mouth daily with breakfast., Disp: 5 tablet, Rfl: 0   Semaglutide, 1 MG/DOSE, 4 MG/3ML SOPN, Inject 1 mg as directed once a week., Disp: 3 mL, Rfl: 12   sertraline (ZOLOFT) 100 MG tablet, Take 1 and 1/2 tablet po daily with breakfast, Disp: 135 tablet, Rfl: 1   verapamil (VERELAN PM) 240 MG 24 hr capsule, Take 1 capsule (  240 mg total) by mouth daily., Disp: 90 capsule, Rfl: 3   zolpidem (AMBIEN) 5 MG tablet, Take 1 tablet (5 mg total) by mouth at bedtime as needed for sleep., Disp: 30 tablet, Rfl: 0   zolpidem (AMBIEN) 5 MG tablet, TAKE 1 TABLET BY MOUTH AT BEDTIME AS NEEDED FOR SLEEP, Disp: 30 tablet, Rfl: 0   estradiol (ESTRACE) 1 MG tablet, TAKE 1 TABLET (1 MG TOTAL) BY MOUTH DAILY., Disp: 30 tablet, Rfl: 6   HYDROcodone-acetaminophen (NORCO/VICODIN) 5-325 MG tablet, Take One tab PO Q 6 hours PRN pain, Disp: 20 tablet, Rfl: 0   Allergies  Allergen Reactions   Codeine Other (See Comments)    Nausea and vomiting   Penicillins    Prednisone Other (See Comments)    Makes her violent    Sulfa Antibiotics Hives    ROS Review of Systems  Constitutional:  Negative for activity change, diaphoresis and fever.  HENT: Negative.    Eyes: Negative.   Respiratory: Negative.    Cardiovascular: Negative.   Gastrointestinal: Negative.   Endocrine: Negative.   Genitourinary: Negative.   Musculoskeletal:  Positive for gait problem and joint swelling.  Skin: Negative.   Allergic/Immunologic: Negative.   Hematological: Negative.   Psychiatric/Behavioral: Negative.    All other systems reviewed and are negative.     Objective:    Physical Exam Vitals reviewed.  Constitutional:      Appearance: Normal appearance.  HENT:     Mouth/Throat:     Mouth: Mucous membranes are moist.  Eyes:     Pupils: Pupils are equal, round, and reactive to light.  Cardiovascular:     Rate and Rhythm:  Normal rate and regular rhythm.     Pulses: Normal pulses.     Heart sounds: Normal heart sounds.  Pulmonary:     Effort: Pulmonary effort is normal.     Breath sounds: Normal breath sounds.  Abdominal:     General: Bowel sounds are normal.     Palpations: Abdomen is soft. There is no hepatomegaly, splenomegaly or mass.  Musculoskeletal:     Cervical back: Neck supple.     Right hip: Tenderness present. Decreased range of motion.     Right lower leg: No edema.     Left lower leg: No edema.  Skin:    General: Skin is warm.  Neurological:     General: No focal deficit present.     Mental Status: She is alert and oriented to person, place, and time. Mental status is at baseline.     Motor: No weakness.  Psychiatric:        Mood and Affect: Mood and affect normal.        Behavior: Behavior normal.     BP (!) 149/85   Pulse 80   Ht 5' (1.524 m)   Wt 194 lb (88 kg)   BMI 37.89 kg/m  Wt Readings from Last 3 Encounters:  04/05/22 194 lb (88 kg)  04/02/22 194 lb (88 kg)  03/26/22 194 lb 1.6 oz (88 kg)     Health Maintenance Due  Topic Date Due   FOOT EXAM  Never done   DEXA SCAN  Never done   COVID-19 Vaccine (4 - Pfizer series) 06/29/2021    There are no preventive care reminders to display for this patient.  Lab Results  Component Value Date   TSH 1.94 10/29/2021   Lab Results  Component Value Date   WBC 8.1 04/02/2022   HGB  9.9 (L) 04/02/2022   HCT 32.3 (L) 04/02/2022   MCV 77.1 (L) 04/02/2022   PLT 246 04/02/2022   Lab Results  Component Value Date   NA 135 04/02/2022   K 3.3 (L) 04/02/2022   CO2 25 04/02/2022   GLUCOSE 123 (H) 04/02/2022   BUN 11 04/02/2022   CREATININE 0.99 04/02/2022   BILITOT 0.4 10/29/2021   ALKPHOS 92 10/06/2017   AST 21 10/29/2021   ALT 18 10/29/2021   PROT 7.0 10/29/2021   ALBUMIN 3.7 10/06/2017   CALCIUM 8.7 (L) 04/02/2022   ANIONGAP 7 04/02/2022   EGFR 83 10/29/2021   Lab Results  Component Value Date   CHOL 265 (H)  10/29/2021   Lab Results  Component Value Date   HDL 77 10/29/2021   Lab Results  Component Value Date   LDLCALC 155 (H) 10/29/2021   Lab Results  Component Value Date   TRIG 189 (H) 10/29/2021   Lab Results  Component Value Date   CHOLHDL 3.4 10/29/2021   Lab Results  Component Value Date   HGBA1C 6.3 (H) 10/29/2021      Assessment & Plan:   Problem List Items Addressed This Visit       Other   Pain of right hip - Primary    Advised pt to use ice pack. Started her on prednisone 20 mg x 5 days. Keep the appointment with the ortho on 11 July, 2023.        Meds ordered this encounter  Medications   DISCONTD: traMADol (ULTRAM) 50 MG tablet    Sig: Take 1 tablet (50 mg total) by mouth 2 (two) times daily for 5 days.    Dispense:  10 tablet    Refill:  0   predniSONE (DELTASONE) 20 MG tablet    Sig: Take 1 tablet (20 mg total) by mouth daily with breakfast.    Dispense:  5 tablet    Refill:  0    Follow-up: No follow-ups on file.    Theresia Lo, NP

## 2022-04-05 NOTE — Assessment & Plan Note (Signed)
Advised pt to use ice pack. Started her on prednisone 20 mg x 5 days. Keep the appointment with the ortho on 11 July, 2023.

## 2022-04-07 MED FILL — Metformin HCl Tab ER 24HR 500 MG: ORAL | 30 days supply | Qty: 60 | Fill #6 | Status: AC

## 2022-04-08 ENCOUNTER — Other Ambulatory Visit: Payer: Self-pay

## 2022-04-08 DIAGNOSIS — M7061 Trochanteric bursitis, right hip: Secondary | ICD-10-CM | POA: Diagnosis not present

## 2022-04-09 ENCOUNTER — Other Ambulatory Visit: Payer: Self-pay

## 2022-04-09 DIAGNOSIS — S76211A Strain of adductor muscle, fascia and tendon of right thigh, initial encounter: Secondary | ICD-10-CM | POA: Diagnosis not present

## 2022-04-09 MED ORDER — CYCLOBENZAPRINE HCL 10 MG PO TABS
ORAL_TABLET | ORAL | 0 refills | Status: DC
  Filled 2022-04-09: qty 30, 10d supply, fill #0

## 2022-04-14 NOTE — Discharge Instructions (Signed)
Instructions after Total Knee Replacement   Dariyah Garduno P. Sharalyn Lomba, Jr., M.D.     Dept. of Orthopaedics & Sports Medicine  [facility]  [address]  [place], [place]  [address]  Phone: [phone]   Fax: [phone]    DIET: Drink plenty of non-alcoholic fluids. Resume your normal diet. Include foods high in fiber.  ACTIVITY:  You may use crutches or a walker with weight-bearing as tolerated, unless instructed otherwise. You may be weaned off of the walker or crutches by your Physical Therapist.  Do NOT place pillows under the knee. Anything placed under the knee could limit your ability to straighten the knee.   Continue doing gentle exercises. Exercising will reduce the pain and swelling, increase motion, and prevent muscle weakness.   Please continue to use the TED compression stockings for 6 weeks. You may remove the stockings at night, but should reapply them in the morning. Do not drive or operate any equipment until instructed.  WOUND CARE:  Continue to use the PolarCare or ice packs periodically to reduce pain and swelling. You may bathe or shower after the staples are removed at the first office visit following surgery.  MEDICATIONS: You may resume your regular medications. Please take the pain medication as prescribed on the medication. Do not take pain medication on an empty stomach. You have been given a prescription for a blood thinner (Lovenox or Coumadin). Please take the medication as instructed. (NOTE: After completing a 2 week course of Lovenox, take one Enteric-coated aspirin once a day. This along with elevation will help reduce the possibility of phlebitis in your operated leg.) Do not drive or drink alcoholic beverages when taking pain medications.  CALL THE OFFICE FOR: Temperature above 101 degrees Excessive bleeding or drainage on the dressing. Excessive swelling, coldness, or paleness of the toes. Persistent nausea and vomiting.  FOLLOW-UP:  You  should have an appointment to return to the office in 10-14 days after surgery. Arrangements have been made for continuation of Physical Therapy (either home therapy or outpatient therapy).   [facility] Department Directory         [email or web address]       [email or web address]          Cardiology  Appointments: [place] - [phone] [place] - [phone]  Endocrinology  Appointments: [place] - [phone] [place] - [phone]  Gastroenterology  Appointments: [place] - [phone] [place] - [phone]        General Surgery   Appointments: [place] - [phone]  Internal Medicine/Family Medicine  Appointments: [place] - [phone] [place] - [phone] [place] - [phone]  Metabolic and Weigh Loss Surgery  Appointments: [place] - [phone]        Neurology  Appointments: [place] - [phone] [place] - [phone]  Neurosurgery  Appointments: [place] - [phone]  Obstetrics & Gynecology  Appointments: [place] - [phone] [place] - [phone]        Pediatrics  Appointments: [place] - [phone] [place] - [phone]  Physiatry  Appointments: [place] -[phone]  Physical Therapy  Appointments: [place] - [phone] [place] - [phone]        Podiatry  Appointments: [place] - [phone] [place] - [phone]  Pulmonology  Appointments: [place] - [phone]  Rheumatology  Appointments: [place] - [phone]        [place] Location: [facility]  [address] [place], [place]  [address]  [place] Location: [facility] [address] [place], [place]  [address]  [place] Location: [facility] [address] [place], [place]  [address]    

## 2022-04-16 DIAGNOSIS — F5081 Binge eating disorder: Secondary | ICD-10-CM | POA: Diagnosis not present

## 2022-04-16 DIAGNOSIS — F4310 Post-traumatic stress disorder, unspecified: Secondary | ICD-10-CM | POA: Diagnosis not present

## 2022-04-16 DIAGNOSIS — F3181 Bipolar II disorder: Secondary | ICD-10-CM | POA: Diagnosis not present

## 2022-04-17 ENCOUNTER — Encounter
Admission: RE | Admit: 2022-04-17 | Discharge: 2022-04-17 | Disposition: A | Discharge status: Home / Self Care | Payer: 59 | Source: Ambulatory Visit | Attending: Orthopedic Surgery | Admitting: Orthopedic Surgery

## 2022-04-17 ENCOUNTER — Other Ambulatory Visit: Payer: Self-pay

## 2022-04-17 DIAGNOSIS — E1165 Type 2 diabetes mellitus with hyperglycemia: Secondary | ICD-10-CM

## 2022-04-17 DIAGNOSIS — Z01812 Encounter for preprocedural laboratory examination: Secondary | ICD-10-CM

## 2022-04-17 HISTORY — DX: Anemia, unspecified: D64.9

## 2022-04-17 HISTORY — DX: Nausea with vomiting, unspecified: R11.2

## 2022-04-17 HISTORY — DX: Unspecified osteoarthritis, unspecified site: M19.90

## 2022-04-17 HISTORY — DX: Pneumonia, unspecified organism: J18.9

## 2022-04-17 HISTORY — DX: Gastro-esophageal reflux disease without esophagitis: K21.9

## 2022-04-17 HISTORY — DX: Personal history of urinary calculi: Z87.442

## 2022-04-17 HISTORY — DX: Other specified postprocedural states: Z98.890

## 2022-04-17 HISTORY — DX: Depression, unspecified: F32.A

## 2022-04-17 HISTORY — DX: Chronic kidney disease, unspecified: N18.9

## 2022-04-17 NOTE — Patient Instructions (Addendum)
Your procedure is scheduled on: 04/29/22 - Monday Report to the Registration Desk on the 1st floor of the [facility]. To find out your arrival time, please call [phone] between 1PM - 3PM on: 04/26/22 - Friday If your arrival time is 6:00 am, do not arrive prior to that time as the [facility] entrance doors do not open until 6:00 am.  REMEMBER: Instructions that are not followed completely may result in serious medical risk, up to and including death; or upon the discretion of your surgeon and anesthesiologist your surgery may need to be rescheduled.  Do not eat food after midnight the night before surgery.  No gum chewing, lozengers or hard candies.  You may however, drink CLEAR liquids up to 2 hours before you are scheduled to arrive for your surgery. Do not drink anything within 2 hours of your scheduled arrival time.  Clear liquids include: - water  Type 1 and Type 2 diabetics should only drink water.  In addition, your doctor has ordered for you to drink the provided  Gatorade G2 Drinking this carbohydrate drink up to two hours before surgery helps to reduce insulin resistance and improve patient outcomes. Please complete drinking 2 hours prior to scheduled arrival time.  TAKE THESE MEDICATIONS THE MORNING OF SURGERY WITH A SIP OF WATER:   - estradiol (ESTRACE) - lisdexamfetamine (VYVANSE)  - pantoprazole (PROTONIX) , (take one the night before and one on the morning of surgery - helps to prevent nausea after surgery.) - verapamil (VERELAN )   Stop taking metFORMIN (GLUCOPHAGE-XR)  beginning 04/27/22, may resume the day after your surgery.  Hold Semaglutide, 1 MG/DOSE, 4 MG/3ML SOPN for 7 days prior to your surgery  STOP taking One week prior to surgery: 04/22/22:  Stop Anti-inflammatories (NSAIDS) such as Advil, Aleve, Ibuprofen, Motrin, Naproxen, Naprosyn and Aspirin based products such as Excedrin, Goodys Powder, BC Powder.  Stop ANY OVER THE COUNTER supplements  until after surgery.  You may take Tylenol if needed for pain up until the day of surgery.  No Alcohol for 24 hours before or after surgery.  No Smoking including e-cigarettes for 24 hours prior to surgery.  No chewable tobacco products for at least 6 hours prior to surgery.  No nicotine patches on the day of surgery.  Do not use any "recreational" drugs for at least a week prior to your surgery.  Please be advised that the combination of cocaine and anesthesia may have negative outcomes, up to and including death. If you test positive for cocaine, your surgery will be cancelled.  On the morning of surgery brush your teeth with toothpaste and water, you may rinse your mouth with mouthwash if you wish. Do not swallow any toothpaste or mouthwash.  Use CHG Soap or wipes as directed on instruction sheet.  Do not wear jewelry, make-up, hairpins, clips or nail polish.  Do not wear lotions, powders, or perfumes.   Do not shave body from the neck down 48 hours prior to surgery just in case you cut yourself which could leave a site for infection.  Also, freshly shaved skin may become irritated if using the CHG soap.  Contact lenses, hearing aids and dentures may not be worn into surgery.  Do not bring valuables to the hospital. [facility] is not responsible for any missing/lost belongings or valuables.   Notify your doctor if there is any change in your medical condition (cold, fever, infection).  Wear comfortable clothing (specific to your surgery type)  to the hospital.  After surgery, you can help prevent lung complications by doing breathing exercises.  Take deep breaths and cough every 1-2 hours. Your doctor may order a device called an Incentive Spirometer to help you take deep breaths. When coughing or sneezing, hold a pillow firmly against your incision with both hands. This is called "splinting." Doing this helps protect your incision. It also decreases belly discomfort.  If you  are being admitted to the hospital overnight, leave your suitcase in the car. After surgery it may be brought to your room.  If you are being discharged the day of surgery, you will not be allowed to drive home. You will need a responsible adult (18 years or older) to drive you home and stay with you that night.   If you are taking public transportation, you will need to have a responsible adult (18 years or older) with you. Please confirm with your physician that it is acceptable to use public transportation.   Please call the Pre-admissions Testing Dept. at [phone] if you have any questions about these instructions.  Surgery Visitation Policy:  Patients undergoing a surgery or procedure may have two family members or support persons with them as long as the person is not COVID-19 positive or experiencing its symptoms.   Inpatient Visitation:    Visiting hours are 7 a.m. to 8 p.m. Up to four visitors are allowed at one time in a patient room, including children. The visitors may rotate out with other people during the day. One designated support person (adult) may remain overnight.

## 2022-04-18 ENCOUNTER — Encounter: Payer: Self-pay | Admitting: Urgent Care

## 2022-04-18 ENCOUNTER — Encounter
Admission: RE | Admit: 2022-04-18 | Discharge: 2022-04-18 | Disposition: A | Discharge status: Home / Self Care | Payer: 59 | Source: Ambulatory Visit | Attending: Orthopedic Surgery | Admitting: Orthopedic Surgery

## 2022-04-18 DIAGNOSIS — Z01818 Encounter for other preprocedural examination: Secondary | ICD-10-CM | POA: Diagnosis not present

## 2022-04-18 DIAGNOSIS — Z01812 Encounter for preprocedural laboratory examination: Secondary | ICD-10-CM

## 2022-04-18 DIAGNOSIS — E1165 Type 2 diabetes mellitus with hyperglycemia: Secondary | ICD-10-CM | POA: Insufficient documentation

## 2022-04-18 DIAGNOSIS — M25511 Pain in right shoulder: Secondary | ICD-10-CM | POA: Diagnosis not present

## 2022-04-18 DIAGNOSIS — F5081 Binge eating disorder: Secondary | ICD-10-CM | POA: Diagnosis not present

## 2022-04-18 DIAGNOSIS — S82402A Unspecified fracture of shaft of left fibula, initial encounter for closed fracture: Secondary | ICD-10-CM | POA: Diagnosis not present

## 2022-04-18 DIAGNOSIS — Z01810 Encounter for preprocedural cardiovascular examination: Secondary | ICD-10-CM | POA: Diagnosis not present

## 2022-04-18 DIAGNOSIS — M1712 Unilateral primary osteoarthritis, left knee: Secondary | ICD-10-CM | POA: Insufficient documentation

## 2022-04-18 DIAGNOSIS — E785 Hyperlipidemia, unspecified: Secondary | ICD-10-CM | POA: Diagnosis not present

## 2022-04-18 DIAGNOSIS — M5137 Other intervertebral disc degeneration, lumbosacral region: Secondary | ICD-10-CM | POA: Diagnosis not present

## 2022-04-18 DIAGNOSIS — F3181 Bipolar II disorder: Secondary | ICD-10-CM | POA: Diagnosis not present

## 2022-04-18 DIAGNOSIS — F4310 Post-traumatic stress disorder, unspecified: Secondary | ICD-10-CM | POA: Diagnosis not present

## 2022-04-18 DIAGNOSIS — G4700 Insomnia, unspecified: Secondary | ICD-10-CM | POA: Diagnosis not present

## 2022-04-18 DIAGNOSIS — M25551 Pain in right hip: Secondary | ICD-10-CM | POA: Diagnosis not present

## 2022-04-18 DIAGNOSIS — R300 Dysuria: Secondary | ICD-10-CM | POA: Insufficient documentation

## 2022-04-18 DIAGNOSIS — K7460 Unspecified cirrhosis of liver: Secondary | ICD-10-CM | POA: Insufficient documentation

## 2022-04-18 DIAGNOSIS — Z471 Aftercare following joint replacement surgery: Secondary | ICD-10-CM | POA: Diagnosis not present

## 2022-04-18 LAB — COMPREHENSIVE METABOLIC PANEL
ALT: 19 U/L (ref 0–44)
AST: 23 U/L (ref 15–41)
Albumin: 3.6 g/dL (ref 3.5–5.0)
Alkaline Phosphatase: 75 U/L (ref 38–126)
Anion gap: 8 (ref 5–15)
BUN: 19 mg/dL (ref 8–23)
CO2: 28 mmol/L (ref 22–32)
Calcium: 9.4 mg/dL (ref 8.9–10.3)
Chloride: 100 mmol/L (ref 98–111)
Creatinine, Ser: 0.72 mg/dL (ref 0.44–1.00)
GFR, Estimated: >60 mL/min (ref >60)
Glucose, Bld: 131 mg/dL — ABNORMAL HIGH (ref 70–99)
Potassium: 3.8 mmol/L (ref 3.5–5.1)
Sodium: 136 mmol/L (ref 135–145)
Total Bilirubin: 0.4 mg/dL (ref 0.3–1.2)
Total Protein: 8 g/dL (ref 6.5–8.1)

## 2022-04-18 LAB — TYPE AND SCREEN
ABO/RH(D): O POS
Antibody Screen: NEGATIVE

## 2022-04-18 LAB — CBC
HCT: 32.9 % — ABNORMAL LOW (ref 36.0–46.0)
Hemoglobin: 10 g/dL — ABNORMAL LOW (ref 12.0–15.0)
MCH: 22.7 pg — ABNORMAL LOW (ref 26.0–34.0)
MCHC: 30.4 g/dL (ref 30.0–36.0)
MCV: 74.8 fL — ABNORMAL LOW (ref 80.0–100.0)
Platelets: 363 10*3/uL (ref 150–400)
RBC: 4.4 MIL/uL (ref 3.87–5.11)
RDW: 15 % (ref 11.5–15.5)
WBC: 7.6 10*3/uL (ref 4.0–10.5)
nRBC: 0 % (ref 0.0–0.2)

## 2022-04-18 LAB — URINALYSIS, ROUTINE W REFLEX MICROSCOPIC
Bilirubin Urine: NEGATIVE
Glucose, UA: NEGATIVE mg/dL
Hgb urine dipstick: NEGATIVE
Ketones, ur: NEGATIVE mg/dL
Leukocytes,Ua: NEGATIVE
Nitrite: NEGATIVE
Protein, ur: NEGATIVE mg/dL
Specific Gravity, Urine: 1.021 (ref 1.005–1.030)
pH: 5 (ref 5.0–8.0)

## 2022-04-18 LAB — SURGICAL PCR SCREEN
MRSA, PCR: POSITIVE — AB
Staphylococcus aureus: POSITIVE — AB

## 2022-04-18 LAB — SEDIMENTATION RATE: Sed Rate: 63 mm/hr — ABNORMAL HIGH (ref 0–30)

## 2022-04-18 LAB — HEMOGLOBIN A1C
Hgb A1c MFr Bld: 7 % — ABNORMAL HIGH (ref 4.8–5.6)
Mean Plasma Glucose: 154.2 mg/dL

## 2022-04-18 LAB — C-REACTIVE PROTEIN: CRP: 1.2 mg/dL — ABNORMAL HIGH (ref <1.0)

## 2022-04-18 NOTE — Progress Notes (Signed)
  Perioperative Services  Abnormal Lab Notification and Treatment Plan of Care  Date: 04/18/22  Name: April Singleton MRN:   [id number]  Re: Abnormal labs noted during PAT appointment  Provider Notified: Donato Heinz, MD Notification mode: Routed and/or faxed via [facility]  Labs of concern: Lab Results  Component Value Date   STAPHAUREUS POSITIVE (A) 04/18/2022   MRSAPCR POSITIVE (A) 04/18/2022    Notes: Patient is scheduled for a COMPUTER ASSISTED TOTAL KNEE ARTHROPLASTY (Left: Knee) on 04/29/2022. She is scheduled to receive CEFAZOLIN pre-operatively. Surgical PCR (+) for MRSA; see above.  PLANS:  April Singleton is scheduled for the above procedure with Dr. Francesco Sor, MD.  Preoperative testing revealed a (+) MRSA PCR screen. Patient will need additional preoperative antimicrobial coverage for this pathogen.  Sending result to primary attending surgeon for review.  Surgeon to place further orders as deemed appropriate for this case.  Quentin Mulling, MSN, APRN, FNP-C, CEN [facility]  Peri-operative Services Nurse Practitioner Phone: [phone] 04/18/22 5:07 PM

## 2022-04-19 ENCOUNTER — Ambulatory Visit: Payer: 59 | Admitting: Physician Assistant

## 2022-04-19 DIAGNOSIS — M1712 Unilateral primary osteoarthritis, left knee: Secondary | ICD-10-CM | POA: Diagnosis not present

## 2022-04-21 LAB — IGE: IgE (Immunoglobulin E), Serum: 95 IU/mL (ref 6–495)

## 2022-04-22 ENCOUNTER — Ambulatory Visit (INDEPENDENT_AMBULATORY_CARE_PROVIDER_SITE_OTHER): Payer: 59 | Admitting: Physician Assistant

## 2022-04-22 DIAGNOSIS — Z9889 Other specified postprocedural states: Secondary | ICD-10-CM

## 2022-04-22 NOTE — Progress Notes (Signed)
Patient is a 71 year old female with PMH of macromastia s/p bilateral breast reduction performed 03/04/2022 Dr. Ulice Bold presents to clinic for postoperative follow-up.  She was last seen here in the office on 03/29/2022.  At that time, she had been feeling improved since her UTI recurrence.  On exam, 1 cm wound noted at right inferior T-zone.  Otherwise, exam was reassuring.  Plan was for continued activity modifications and compressive garments and for her to return in 3 weeks.  Today, she is doing well.  She has a planned knee arthroplasty next week with Dr. Ernest Pine, orthopedics.  From a breast implant, she feels as though her wound had already resolved.  She has been applying Vaseline regularly.  She went swimming yesterday which is helped with a hip bursitis and knee discomfort.  She believes that she has a protruding suture from left inframammary incision, but otherwise no complaints.  Physical exam is entirely reassuring.  Breasts with excellent shape and symmetry.  There is a small suture protruding from left inframammary incision that is removed here in clinic without difficulty or complication.  She has no wounds.  She has healed quite nicely.  No evidence concerning for infection.  Breasts are soft, no obvious areas of fat necrosis or residual subcutaneous fluid collections.  At this point, feel comfortable lifting restrictions.  She has healed up quite nicely.  She is quite pleased with her reduction.  Discussed scar creams, recommended [id number].  No previous history of keloiding.  Photos were obtained at previous encounter.  No specific follow-up needed.  She will call the clinic should she have any questions or concerns in the future.

## 2022-04-23 DIAGNOSIS — F4310 Post-traumatic stress disorder, unspecified: Secondary | ICD-10-CM | POA: Diagnosis not present

## 2022-04-23 DIAGNOSIS — F5081 Binge eating disorder: Secondary | ICD-10-CM | POA: Diagnosis not present

## 2022-04-23 DIAGNOSIS — F3181 Bipolar II disorder: Secondary | ICD-10-CM | POA: Diagnosis not present

## 2022-04-26 ENCOUNTER — Other Ambulatory Visit: Payer: Self-pay

## 2022-04-26 DIAGNOSIS — F5081 Binge eating disorder: Secondary | ICD-10-CM | POA: Diagnosis not present

## 2022-04-26 DIAGNOSIS — G4700 Insomnia, unspecified: Secondary | ICD-10-CM | POA: Diagnosis not present

## 2022-04-26 DIAGNOSIS — F4310 Post-traumatic stress disorder, unspecified: Secondary | ICD-10-CM | POA: Diagnosis not present

## 2022-04-26 DIAGNOSIS — F3181 Bipolar II disorder: Secondary | ICD-10-CM | POA: Diagnosis not present

## 2022-04-26 MED ORDER — ASENAPINE MALEATE 2.5 MG SL SUBL
SUBLINGUAL_TABLET | SUBLINGUAL | 1 refills | Status: DC
  Filled 2022-04-26: qty 90, 90d supply, fill #0

## 2022-04-26 MED ORDER — VYVANSE 20 MG PO CAPS: ORAL_CAPSULE | Freq: Every day | ORAL | 0 refills | Status: DC

## 2022-04-28 ENCOUNTER — Other Ambulatory Visit: Payer: Self-pay

## 2022-04-28 NOTE — H&P (Signed)
ORTHOPAEDIC HISTORY & PHYSICAL April Singleton, [place] - 04/19/2022 2:45 PM EDT Formatting of this note is different from the original. [facility] Chief Complaint:   Chief Complaint  Patient presents with  Knee Pain  H & P LEFT KNEE   History of Present Illness:   April Singleton is a 71 y.o. female that presents to clinic today for her preoperative history and evaluation. Patient presents unaccompanied. The patient is scheduled to undergo a left total knee arthroplasty on 04/29/22 by Dr. Marry Guan. Her pain began over a year ago. The pain is located primarily along the medial aspect of the knee. She describes her pain as worse with any weightbearing. She reports associated swelling with some giving way of the knee. She denies associated numbness or tingling, denies locking.   The patient's symptoms have progressed to the point that they decrease her quality of life. The patient has previously undergone conservative treatment including NSAIDS and injections to the knee without adequate control of her symptoms.  Denies history of lumbar surgery, DVT, denies significant cardiac history.   She has been diagnosed with a right hip strain and right trochanteric bursitis. Has had cortisone injection of the right hip with Dr. Sabra Heck at [facility] on 04/08/2022 and states the groin strain is improving. Ambulating today with a walker.  Patient will have her husband at home post-operatively.   Patient is a type II diabetic. Last A1c 7.0 on 04/18/2022  Past Medical, Surgical, Family, Social History, Allergies, Medications:   Past Medical History:  Past Medical History:  Diagnosis Date  Asthma without status asthmaticus  Cirrhosis (CMS-HCC)  Colitis, unspecified  Colon polyps  Diabetes mellitus type 2, uncomplicated (CMS-HCC)  Hyperlipidemia  Hypertension  Low vitamin D level  Neuropathy  Obesity  Osteoarthritis   Past Surgical History:  Past  Surgical History:  Procedure Laterality Date  EGD 03/21/2001  No repeat per RTE (dw)  COLONOSCOPY 09/19/2014  FH Colon Polyps (Father): [facility] 08/2019  COLONOSCOPY 08/19/2018  Hyperplastic colon polyp/FHx CP-Father/Repeat 55yrs/MUS  EGD 08/19/2018  Procedure aborted due to coughing and gaging/Will decide repeat at [facility]  Bilateral great toe toenail removal  Biopsy of the breast Left  COLONOSCOPY 05/22/1992, 03/01/2001  FH Colon Polyps (Father)  COLONOSCOPY  EYELID SURGERY Bilateral  Hemorrhoid surgery  HYSTERECTOMY  TONSILLECTOMY  TUBAL LIGATION  UPPER GASTROINTESTINAL ENDOSCOPY  Vein surgery  Wisdom teeth extraction  Age 55 yrs   Current Medications:  Current Outpatient Medications  Medication Sig Dispense Refill  acetaminophen (TYLENOL) 500 MG tablet Take 1,000 mg by mouth every 6 (six) hours as needed for Pain or Fever  asenapine maleate 2.5 mg Subl 1 tablet once daily  celecoxib (CELEBREX) 200 MG capsule Take 1 capsule (200 mg total) by mouth 2 (two) times daily 60 capsule 3  cholestyramine (QUESTRAN) 4 gram oral powder packet once as needed  clotrimazole-betamethasone (LOTRISONE) 1-0.05 % cream Apply 1 Application topically 2 (two) times daily 5  estradiol (ESTRACE) 1 MG tablet Take 1 tablet (1 mg total) by mouth at bedtime 5  hydroCHLOROthiazide (HYDRODIURIL) 12.5 MG tablet  HYDROcodone-acetaminophen (NORCO) 5-325 mg tablet Take 1 tablet by mouth every 6 (six) hours as needed  ibuprofen (MOTRIN) 200 MG tablet Take 200 mg by mouth every 6 (six) hours as needed for Pain  lisdexamfetamine (VYVANSE) 20 MG capsule Take 20 mg by mouth once daily  losartan (COZAAR) 50 MG tablet Take 50 mg by mouth once daily  losartan-hydroCHLOROthiazide (HYZAAR) 50-12.5  mg tablet Take 1 tablet every day by oral route.  magnesium aspartate/vit B6/Zn (ZINC-MAGNESIUM ASPART-VIT B6 ORAL) Take 1 tablet by mouth at bedtime  metFORMIN (GLUCOPHAGE-XR) 500 MG XR tablet Take 1 tablet (500 mg total) by mouth 2  (two) times daily with meals 2  milk thistle 175 mg tablet Take 175 mg by mouth once daily  naproxen (NAPROSYN) 500 MG tablet Take 1 tablet (500 mg total) by mouth 2 (two) times daily with a meal.  naproxen sodium (ALEVE) 220 MG tablet Take 220 mg by mouth 2 (two) times daily as needed for Pain  ondansetron (ZOFRAN-ODT) 4 MG disintegrating tablet Take 4 mg by mouth every 8 (eight) hours as needed  OZEMPIC 1 mg/dose (4 mg/3 mL) pen injector Inject 1 mg subcutaneously once a week  pantoprazole (PROTONIX) 40 MG DR tablet Take 1 tablet (40 mg total) by mouth as directed Take 30-45 minutes before breakfast 30 tablet 6  sertraline (ZOLOFT) 100 MG tablet Take 100 mg by mouth once daily  verapamil (VERELAN PM) 200 mg ER capsule Take 1 capsule (200 mg total) by mouth once daily  zolpidem (AMBIEN) 5 MG tablet once as needed   No current facility-administered medications for this visit.   Allergies:  Allergies  Allergen Reactions  Codeine Nausea, Hallucination and Vomiting  Prednisone Other (See Comments)  Altered mental status-Makes her violent  Makes her violent , patient reports only in high doses  Sulfa (Sulfonamide Antibiotics) Hives   Social History:  Social History   Socioeconomic History  Marital status: Married  Spouse name: Danny  Number of children: 3  Years of education: 16  Highest education level: Bachelor's degree (e.g., BA, AB, BS)  Occupational History  Occupation: Environmental education officer -Charity fundraiser  Comment: [employer]  Tobacco Use  Smoking status: Never  Smokeless tobacco: Never  Vaping Use  Vaping Use: Never used  Substance and Sexual Activity  Alcohol use: Not Currently  Alcohol/week: 0.0 standard drinks  Comment: rarely; a glass of wine  Drug use: No  Sexual activity: Defer  Partners: Male   Family History:  Family History  Problem Relation Age of Onset  Alzheimer's disease Mother  Stroke Mother  COPD Mother  Liver cancer Father  COPD Father  High blood pressure  (Hypertension) Father  Alcohol abuse Father   Review of Systems:   A 10+ ROS was performed, reviewed, and the pertinent orthopaedic findings are documented in the HPI.   Physical Examination:   BP 130/80 (BP Location: Left upper arm, Patient Position: Sitting, BP Cuff Size: Adult)  Ht 152.4 cm (5')  Wt 85.4 kg (188 lb 3.2 oz)  BMI 36.76 kg/m   Patient is a well-developed, well-nourished female in no acute distress. Patient has normal mood and affect. Patient is alert and oriented to person, place, and time.   HEENT: Atraumatic, normocephalic. Pupils equal and reactive to light. Extraocular motion intact. Noninjected sclera.  Cardiovascular: Regular rate and rhythm, with no murmurs, rubs, or gallops. Distal pulses palpable.  Respiratory: Lungs clear to auscultation bilaterally.   Left Knee: Soft tissue swelling: minimal Effusion: none Erythema: none Crepitance: mild Tenderness: medial Alignment: relative varus Mediolateral laxity: medial pseudolaxity Posterior sag: negative Patellar tracking: Good tracking without evidence of subluxation or tilt Atrophy: No significant atrophy.  Quadriceps tone was fair to good. Range of motion: 0/0/128 degrees   Ella plantarflex and dorsiflex the ankle. Able to flex and extend the toes.  Sensation intact over the saphenous, lateral sural cutaneous, superficial fibular, and deep  fibular nerve distributions.  Tests Performed/Reviewed:  X-rays  3 views of the left knee were reviewed. Images reveal severe loss of medial compartment joint space with osteophyte formation. No fractures or dislocations. No other osseous abnormality noted  Impression:   ICD-10-CM  1. Primary osteoarthritis of left knee M17.12   Plan:   The patient has end-stage degenerative changes of the left knee. It was explained to the patient that the condition is progressive in nature. Having failed conservative treatment, the patient has elected to proceed with a  total joint arthroplasty. The patient will undergo a total joint arthroplasty with Dr. Ernest Pine. The risks of surgery, including blood clot and infection, were discussed with the patient. Measures to reduce these risks, including the use of anticoagulation, perioperative antibiotics, and early ambulation were discussed. The importance of postoperative physical therapy was discussed with the patient. The patient elects to proceed with surgery. The patient is instructed to stop all blood thinners prior to surgery. The patient is instructed to call the hospital the day before surgery to learn of the proper arrival time.   Contact our office with any questions or concerns. Follow up as indicated, or sooner should any new problems arise, if conditions worsen, or if they are otherwise concerned.   Michelene Gardener, PA -C [facility] [address] [place], [place] [address] Phone: [phone]  This note was generated in part with voice recognition software and I apologize for any typographical errors that were not detected and corrected.  Electronically signed by Michelene Gardener, PA at 04/20/2022 3:37 PM EDT

## 2022-04-29 ENCOUNTER — Other Ambulatory Visit: Payer: Self-pay

## 2022-04-29 ENCOUNTER — Encounter: Payer: Self-pay | Admitting: Orthopedic Surgery

## 2022-04-29 ENCOUNTER — Encounter
Admission: RE | Disposition: A | Discharge status: Home / Self Care | Payer: Self-pay | Source: Home / Self Care | Attending: Orthopedic Surgery

## 2022-04-29 ENCOUNTER — Ambulatory Visit: Payer: 59 | Admitting: Urgent Care

## 2022-04-29 ENCOUNTER — Observation Stay: Payer: 59

## 2022-04-29 ENCOUNTER — Observation Stay
Admission: RE | Admit: 2022-04-29 | Discharge: 2022-04-30 | Disposition: A | Discharge status: Home / Self Care | Payer: 59 | Attending: Orthopedic Surgery | Admitting: Orthopedic Surgery

## 2022-04-29 DIAGNOSIS — Z471 Aftercare following joint replacement surgery: Secondary | ICD-10-CM | POA: Diagnosis not present

## 2022-04-29 DIAGNOSIS — M1712 Unilateral primary osteoarthritis, left knee: Secondary | ICD-10-CM | POA: Diagnosis not present

## 2022-04-29 DIAGNOSIS — E1140 Type 2 diabetes mellitus with diabetic neuropathy, unspecified: Secondary | ICD-10-CM | POA: Insufficient documentation

## 2022-04-29 DIAGNOSIS — Z7984 Long term (current) use of oral hypoglycemic drugs: Secondary | ICD-10-CM | POA: Insufficient documentation

## 2022-04-29 DIAGNOSIS — Z79899 Other long term (current) drug therapy: Secondary | ICD-10-CM | POA: Diagnosis not present

## 2022-04-29 DIAGNOSIS — Z96652 Presence of left artificial knee joint: Secondary | ICD-10-CM | POA: Diagnosis not present

## 2022-04-29 DIAGNOSIS — R300 Dysuria: Secondary | ICD-10-CM

## 2022-04-29 DIAGNOSIS — E785 Hyperlipidemia, unspecified: Secondary | ICD-10-CM

## 2022-04-29 DIAGNOSIS — E1122 Type 2 diabetes mellitus with diabetic chronic kidney disease: Secondary | ICD-10-CM | POA: Diagnosis not present

## 2022-04-29 DIAGNOSIS — I129 Hypertensive chronic kidney disease with stage 1 through stage 4 chronic kidney disease, or unspecified chronic kidney disease: Secondary | ICD-10-CM | POA: Diagnosis not present

## 2022-04-29 DIAGNOSIS — Z96659 Presence of unspecified artificial knee joint: Secondary | ICD-10-CM

## 2022-04-29 DIAGNOSIS — J45909 Unspecified asthma, uncomplicated: Secondary | ICD-10-CM | POA: Diagnosis not present

## 2022-04-29 DIAGNOSIS — K7460 Unspecified cirrhosis of liver: Secondary | ICD-10-CM

## 2022-04-29 DIAGNOSIS — E1165 Type 2 diabetes mellitus with hyperglycemia: Secondary | ICD-10-CM

## 2022-04-29 DIAGNOSIS — N189 Chronic kidney disease, unspecified: Secondary | ICD-10-CM | POA: Diagnosis not present

## 2022-04-29 DIAGNOSIS — I10 Essential (primary) hypertension: Secondary | ICD-10-CM | POA: Diagnosis not present

## 2022-04-29 HISTORY — PX: KNEE ARTHROPLASTY: SHX992

## 2022-04-29 LAB — POCT I-STAT, CHEM 8
BUN: 15 mg/dL (ref 8–23)
Calcium, Ion: 1.2 mmol/L (ref 1.15–1.40)
Chloride: 104 mmol/L (ref 98–111)
Creatinine, Ser: 0.7 mg/dL (ref 0.44–1.00)
Glucose, Bld: 106 mg/dL — ABNORMAL HIGH (ref 70–99)
HCT: 31 % — ABNORMAL LOW (ref 36.0–46.0)
Hemoglobin: 10.5 g/dL — ABNORMAL LOW (ref 12.0–15.0)
Potassium: 3.7 mmol/L (ref 3.5–5.1)
Sodium: 139 mmol/L (ref 135–145)
TCO2: 24 mmol/L (ref 22–32)

## 2022-04-29 LAB — GLUCOSE, CAPILLARY
Glucose-Capillary: 204 mg/dL — ABNORMAL HIGH (ref 70–99)
Glucose-Capillary: 280 mg/dL — ABNORMAL HIGH (ref 70–99)
Glucose-Capillary: 175 mg/dL — ABNORMAL HIGH (ref 70–99)

## 2022-04-29 LAB — ABO/RH: ABO/RH(D): O POS

## 2022-04-29 SURGERY — ARTHROPLASTY, KNEE, TOTAL, USING IMAGELESS COMPUTER-ASSISTED NAVIGATION
Anesthesia: Spinal | Site: Knee | Laterality: Left

## 2022-04-29 MED ORDER — ORAL CARE MOUTH RINSE: 15.0000 mL | Freq: Once | OROMUCOSAL | Status: AC

## 2022-04-29 MED ORDER — HYDROMORPHONE HCL 1 MG/ML IJ SOLN
0.5000 mg | INTRAMUSCULAR | Status: DC | PRN
  Administered 2022-04-30: 1 mg via INTRAVENOUS
  Filled 2022-04-29: qty 1

## 2022-04-29 MED ORDER — TRANEXAMIC ACID-NACL 1000-0.7 MG/100ML-% IV SOLN
INTRAVENOUS | Status: DC | PRN
  Administered 2022-04-29: 1000 mg via INTRAVENOUS

## 2022-04-29 MED ORDER — CELECOXIB 200 MG PO CAPS: 400.0000 mg | ORAL_CAPSULE | Freq: Once | ORAL | Status: AC

## 2022-04-29 MED ORDER — SODIUM CHLORIDE (PF) 0.9 % IJ SOLN
INTRAMUSCULAR | Status: DC | PRN
  Administered 2022-04-29: 120 mL

## 2022-04-29 MED ORDER — TRANEXAMIC ACID-NACL 1000-0.7 MG/100ML-% IV SOLN
INTRAVENOUS | Status: AC
  Administered 2022-04-29: 1000 mg via INTRAVENOUS
  Filled 2022-04-29: qty 100

## 2022-04-29 MED ORDER — SODIUM CHLORIDE 0.9 % IV SOLN: INTRAVENOUS | Status: DC

## 2022-04-29 MED ORDER — FENTANYL CITRATE (PF) 100 MCG/2ML IJ SOLN
INTRAMUSCULAR | Status: AC
  Filled 2022-04-29: qty 2

## 2022-04-29 MED ORDER — METOCLOPRAMIDE HCL 10 MG PO TABS
10.0000 mg | ORAL_TABLET | Freq: Three times a day (TID) | ORAL | Status: DC
  Administered 2022-04-29 – 2022-04-30 (×4): 10 mg via ORAL
  Filled 2022-04-29 (×9): qty 1

## 2022-04-29 MED ORDER — IPRATROPIUM BROMIDE 0.06 % NA SOLN: 2.0000 | Freq: Three times a day (TID) | NASAL | Status: DC | PRN

## 2022-04-29 MED ORDER — SENNOSIDES-DOCUSATE SODIUM 8.6-50 MG PO TABS
1.0000 | ORAL_TABLET | Freq: Two times a day (BID) | ORAL | Status: DC
  Administered 2022-04-29 – 2022-04-30 (×2): 1 via ORAL
  Filled 2022-04-29 (×2): qty 1

## 2022-04-29 MED ORDER — ENOXAPARIN SODIUM 30 MG/0.3ML IJ SOSY
30.0000 mg | PREFILLED_SYRINGE | Freq: Two times a day (BID) | INTRAMUSCULAR | Status: DC
  Administered 2022-04-30: 30 mg via SUBCUTANEOUS
  Filled 2022-04-29: qty 0.3

## 2022-04-29 MED ORDER — FLEET ENEMA 7-19 GM/118ML RE ENEM: 1.0000 | ENEMA | Freq: Once | RECTAL | Status: DC | PRN

## 2022-04-29 MED ORDER — ONDANSETRON HCL 4 MG/2ML IJ SOLN: 4.0000 mg | Freq: Once | INTRAMUSCULAR | Status: DC | PRN

## 2022-04-29 MED ORDER — ASENAPINE MALEATE 2.5 MG SL SUBL
2.5000 mg | SUBLINGUAL_TABLET | Freq: Every day | SUBLINGUAL | Status: DC
  Filled 2022-04-29 (×2): qty 1

## 2022-04-29 MED ORDER — CHLORHEXIDINE GLUCONATE 4 % EX LIQD: 60.0000 mL | Freq: Once | CUTANEOUS | Status: DC

## 2022-04-29 MED ORDER — PROPOFOL 1000 MG/100ML IV EMUL
INTRAVENOUS | Status: AC
  Filled 2022-04-29: qty 100

## 2022-04-29 MED ORDER — CHLORHEXIDINE GLUCONATE 0.12 % MT SOLN
OROMUCOSAL | Status: AC
  Administered 2022-04-29: 15 mL via OROMUCOSAL
  Filled 2022-04-29: qty 15

## 2022-04-29 MED ORDER — VERAPAMIL HCL ER 240 MG PO TBCR
240.0000 mg | EXTENDED_RELEASE_TABLET | Freq: Every day | ORAL | Status: DC
  Administered 2022-04-30: 240 mg via ORAL
  Filled 2022-04-29 (×2): qty 1

## 2022-04-29 MED ORDER — ZOLPIDEM TARTRATE 5 MG PO TABS
5.0000 mg | ORAL_TABLET | Freq: Every evening | ORAL | Status: DC | PRN
  Administered 2022-04-29: 5 mg via ORAL
  Filled 2022-04-29: qty 1

## 2022-04-29 MED ORDER — SURGIPHOR WOUND IRRIGATION SYSTEM - OPTIME
TOPICAL | Status: DC | PRN
  Administered 2022-04-29: 450 mL via TOPICAL

## 2022-04-29 MED ORDER — VANCOMYCIN HCL IN DEXTROSE 1-5 GM/200ML-% IV SOLN
INTRAVENOUS | Status: AC
  Administered 2022-04-29: 1000 mg via INTRAVENOUS
  Filled 2022-04-29: qty 200

## 2022-04-29 MED ORDER — TRAMADOL HCL 50 MG PO TABS
50.0000 mg | ORAL_TABLET | ORAL | Status: DC | PRN
  Administered 2022-04-29: 100 mg via ORAL
  Administered 2022-04-30: 50 mg via ORAL
  Filled 2022-04-29: qty 1

## 2022-04-29 MED ORDER — ONDANSETRON HCL 4 MG/2ML IJ SOLN: 4.0000 mg | Freq: Four times a day (QID) | INTRAMUSCULAR | Status: DC | PRN

## 2022-04-29 MED ORDER — VANCOMYCIN HCL IN DEXTROSE 1-5 GM/200ML-% IV SOLN: 1000.0000 mg | Freq: Once | INTRAVENOUS | Status: AC

## 2022-04-29 MED ORDER — METFORMIN HCL ER 500 MG PO TB24
500.0000 mg | ORAL_TABLET | Freq: Two times a day (BID) | ORAL | Status: DC
  Administered 2022-04-29 – 2022-04-30 (×2): 500 mg via ORAL
  Filled 2022-04-29 (×3): qty 1

## 2022-04-29 MED ORDER — GABAPENTIN 300 MG PO CAPS: 300.0000 mg | ORAL_CAPSULE | Freq: Once | ORAL | Status: AC

## 2022-04-29 MED ORDER — TRAMADOL HCL 50 MG PO TABS
ORAL_TABLET | ORAL | Status: AC
  Filled 2022-04-29: qty 2

## 2022-04-29 MED ORDER — LISDEXAMFETAMINE DIMESYLATE 20 MG PO CAPS
20.0000 mg | ORAL_CAPSULE | Freq: Every day | ORAL | Status: DC
  Administered 2022-04-30: 20 mg via ORAL
  Filled 2022-04-29: qty 1

## 2022-04-29 MED ORDER — BUPIVACAINE LIPOSOME 1.3 % IJ SUSP
INTRAMUSCULAR | Status: AC
  Filled 2022-04-29: qty 20

## 2022-04-29 MED ORDER — ALUM & MAG HYDROXIDE-SIMETH 200-200-20 MG/5ML PO SUSP: 30.0000 mL | ORAL | Status: DC | PRN

## 2022-04-29 MED ORDER — ASENAPINE MALEATE 2.5 MG SL SUBL
2.5000 mg | SUBLINGUAL_TABLET | Freq: Every day | SUBLINGUAL | Status: DC
  Filled 2022-04-29: qty 1

## 2022-04-29 MED ORDER — SODIUM CHLORIDE 0.9 % IR SOLN
Status: DC | PRN
  Administered 2022-04-29: 3000 mL

## 2022-04-29 MED ORDER — MENTHOL 3 MG MT LOZG: 1.0000 | LOZENGE | OROMUCOSAL | Status: DC | PRN

## 2022-04-29 MED ORDER — PROPOFOL 500 MG/50ML IV EMUL
INTRAVENOUS | Status: DC | PRN
  Administered 2022-04-29: 100 ug/kg/min via INTRAVENOUS

## 2022-04-29 MED ORDER — ESTRADIOL 1 MG PO TABS
1.0000 mg | ORAL_TABLET | Freq: Every day | ORAL | Status: DC
  Administered 2022-04-30: 1 mg via ORAL
  Filled 2022-04-29: qty 1

## 2022-04-29 MED ORDER — PROPOFOL 10 MG/ML IV BOLUS
INTRAVENOUS | Status: DC | PRN
  Administered 2022-04-29: 30 mg via INTRAVENOUS

## 2022-04-29 MED ORDER — TRANEXAMIC ACID-NACL 1000-0.7 MG/100ML-% IV SOLN: 1000.0000 mg | INTRAVENOUS | Status: DC

## 2022-04-29 MED ORDER — MIDAZOLAM HCL 5 MG/5ML IJ SOLN
INTRAMUSCULAR | Status: DC | PRN
  Administered 2022-04-29: 2 mg via INTRAVENOUS

## 2022-04-29 MED ORDER — MIDAZOLAM HCL 2 MG/2ML IJ SOLN
INTRAMUSCULAR | Status: AC
  Filled 2022-04-29: qty 2

## 2022-04-29 MED ORDER — SERTRALINE HCL 50 MG PO TABS
150.0000 mg | ORAL_TABLET | Freq: Every day | ORAL | Status: DC
  Administered 2022-04-30: 150 mg via ORAL
  Filled 2022-04-29 (×2): qty 1

## 2022-04-29 MED ORDER — CELECOXIB 200 MG PO CAPS
200.0000 mg | ORAL_CAPSULE | Freq: Two times a day (BID) | ORAL | Status: DC
  Administered 2022-04-30: 200 mg via ORAL
  Filled 2022-04-29: qty 1

## 2022-04-29 MED ORDER — EPHEDRINE SULFATE (PRESSORS) 50 MG/ML IJ SOLN: INTRAMUSCULAR | Status: DC | PRN

## 2022-04-29 MED ORDER — INSULIN ASPART 100 UNIT/ML IJ SOLN
0.0000 [IU] | Freq: Every day | INTRAMUSCULAR | Status: DC
  Filled 2022-04-29: qty 1

## 2022-04-29 MED ORDER — CELECOXIB 200 MG PO CAPS
ORAL_CAPSULE | ORAL | Status: AC
  Administered 2022-04-29: 400 mg via ORAL
  Filled 2022-04-29: qty 2

## 2022-04-29 MED ORDER — ACETAMINOPHEN 10 MG/ML IV SOLN
1000.0000 mg | Freq: Four times a day (QID) | INTRAVENOUS | Status: DC
  Administered 2022-04-29 – 2022-04-30 (×3): 1000 mg via INTRAVENOUS
  Filled 2022-04-29 (×4): qty 100

## 2022-04-29 MED ORDER — HYDROCHLOROTHIAZIDE 12.5 MG PO TABS
12.5000 mg | ORAL_TABLET | Freq: Every day | ORAL | Status: DC
  Administered 2022-04-30: 12.5 mg via ORAL
  Filled 2022-04-29: qty 1

## 2022-04-29 MED ORDER — TRANEXAMIC ACID-NACL 1000-0.7 MG/100ML-% IV SOLN: 1000.0000 mg | Freq: Once | INTRAVENOUS | Status: AC

## 2022-04-29 MED ORDER — CHLORHEXIDINE GLUCONATE 0.12 % MT SOLN: 15.0000 mL | Freq: Once | OROMUCOSAL | Status: AC

## 2022-04-29 MED ORDER — OXYCODONE HCL 5 MG PO TABS: 10.0000 mg | ORAL_TABLET | ORAL | Status: DC | PRN

## 2022-04-29 MED ORDER — FERROUS SULFATE 325 (65 FE) MG PO TABS
325.0000 mg | ORAL_TABLET | Freq: Two times a day (BID) | ORAL | Status: DC
  Administered 2022-04-29 – 2022-04-30 (×2): 325 mg via ORAL
  Filled 2022-04-29 (×2): qty 1

## 2022-04-29 MED ORDER — DIPHENHYDRAMINE HCL 12.5 MG/5ML PO ELIX: 12.5000 mg | ORAL_SOLUTION | ORAL | Status: DC | PRN

## 2022-04-29 MED ORDER — 0.9 % SODIUM CHLORIDE (POUR BTL) OPTIME
TOPICAL | Status: DC | PRN
  Administered 2022-04-29: 1000 mL

## 2022-04-29 MED ORDER — BUPIVACAINE HCL (PF) 0.5 % IJ SOLN
INTRAMUSCULAR | Status: AC
  Filled 2022-04-29: qty 10

## 2022-04-29 MED ORDER — PROPOFOL 10 MG/ML IV BOLUS
INTRAVENOUS | Status: AC
  Filled 2022-04-29: qty 20

## 2022-04-29 MED ORDER — CEFAZOLIN SODIUM-DEXTROSE 2-4 GM/100ML-% IV SOLN
2.0000 g | INTRAVENOUS | Status: AC
  Administered 2022-04-29: 2 g via INTRAVENOUS

## 2022-04-29 MED ORDER — ONDANSETRON HCL 4 MG PO TABS: 4.0000 mg | ORAL_TABLET | Freq: Four times a day (QID) | ORAL | Status: DC | PRN

## 2022-04-29 MED ORDER — PHENYLEPHRINE HCL-NACL 20-0.9 MG/250ML-% IV SOLN
INTRAVENOUS | Status: AC
  Filled 2022-04-29: qty 250

## 2022-04-29 MED ORDER — BUPIVACAINE HCL (PF) 0.25 % IJ SOLN
INTRAMUSCULAR | Status: AC
Start: 2022-04-29 — End: ?
  Filled 2022-04-29: qty 60

## 2022-04-29 MED ORDER — FENTANYL CITRATE (PF) 100 MCG/2ML IJ SOLN
INTRAMUSCULAR | Status: DC | PRN
  Administered 2022-04-29: 25 ug via INTRAVENOUS

## 2022-04-29 MED ORDER — SODIUM CHLORIDE FLUSH 0.9 % IV SOLN
INTRAVENOUS | Status: AC
  Filled 2022-04-29: qty 40

## 2022-04-29 MED ORDER — ACETAMINOPHEN 10 MG/ML IV SOLN
INTRAVENOUS | Status: AC
  Filled 2022-04-29: qty 100

## 2022-04-29 MED ORDER — TRANEXAMIC ACID-NACL 1000-0.7 MG/100ML-% IV SOLN
INTRAVENOUS | Status: AC
  Filled 2022-04-29: qty 100

## 2022-04-29 MED ORDER — ACETAMINOPHEN 325 MG PO TABS: 325.0000 mg | ORAL_TABLET | Freq: Four times a day (QID) | ORAL | Status: DC | PRN

## 2022-04-29 MED ORDER — DEXAMETHASONE SODIUM PHOSPHATE 10 MG/ML IJ SOLN: 8.0000 mg | Freq: Once | INTRAMUSCULAR | Status: AC

## 2022-04-29 MED ORDER — FENTANYL CITRATE (PF) 100 MCG/2ML IJ SOLN: 25.0000 ug | INTRAMUSCULAR | Status: DC | PRN

## 2022-04-29 MED ORDER — GABAPENTIN 300 MG PO CAPS
ORAL_CAPSULE | ORAL | Status: AC
  Administered 2022-04-29: 300 mg via ORAL
  Filled 2022-04-29: qty 1

## 2022-04-29 MED ORDER — ACETAMINOPHEN 10 MG/ML IV SOLN
INTRAVENOUS | Status: DC | PRN
  Administered 2022-04-29: 1000 mg via INTRAVENOUS

## 2022-04-29 MED ORDER — LOSARTAN POTASSIUM 50 MG PO TABS
50.0000 mg | ORAL_TABLET | Freq: Every day | ORAL | Status: DC
  Administered 2022-04-30: 50 mg via ORAL
  Filled 2022-04-29: qty 1

## 2022-04-29 MED ORDER — CEFAZOLIN SODIUM-DEXTROSE 2-4 GM/100ML-% IV SOLN
2.0000 g | Freq: Four times a day (QID) | INTRAVENOUS | Status: AC
  Administered 2022-04-29: 2 g via INTRAVENOUS
  Filled 2022-04-29: qty 100

## 2022-04-29 MED ORDER — BISACODYL 10 MG RE SUPP: 10.0000 mg | Freq: Every day | RECTAL | Status: DC | PRN

## 2022-04-29 MED ORDER — OXYCODONE HCL 5 MG PO TABS: 5.0000 mg | ORAL_TABLET | ORAL | Status: DC | PRN

## 2022-04-29 MED ORDER — ONDANSETRON HCL 4 MG/2ML IJ SOLN
INTRAMUSCULAR | Status: DC | PRN
  Administered 2022-04-29: 4 mg via INTRAVENOUS

## 2022-04-29 MED ORDER — INSULIN ASPART 100 UNIT/ML IJ SOLN
0.0000 [IU] | Freq: Three times a day (TID) | INTRAMUSCULAR | Status: DC
  Administered 2022-04-29: 8 [IU] via SUBCUTANEOUS
  Administered 2022-04-30: 2 [IU] via SUBCUTANEOUS
  Administered 2022-04-30: 5 [IU] via SUBCUTANEOUS
  Filled 2022-04-29 (×3): qty 1

## 2022-04-29 MED ORDER — PHENOL 1.4 % MT LIQD: 1.0000 | OROMUCOSAL | Status: DC | PRN

## 2022-04-29 MED ORDER — ONDANSETRON HCL 4 MG/2ML IJ SOLN
INTRAMUSCULAR | Status: AC
  Filled 2022-04-29: qty 2

## 2022-04-29 MED ORDER — PANTOPRAZOLE SODIUM 40 MG PO TBEC
40.0000 mg | DELAYED_RELEASE_TABLET | Freq: Two times a day (BID) | ORAL | Status: DC
  Administered 2022-04-29 – 2022-04-30 (×2): 40 mg via ORAL
  Filled 2022-04-29 (×3): qty 1

## 2022-04-29 MED ORDER — CEFAZOLIN SODIUM-DEXTROSE 2-4 GM/100ML-% IV SOLN
INTRAVENOUS | Status: AC
  Filled 2022-04-29: qty 100

## 2022-04-29 MED ORDER — CLOTRIMAZOLE 1 % EX CREA
TOPICAL_CREAM | Freq: Every day | CUTANEOUS | Status: DC
  Filled 2022-04-29: qty 15

## 2022-04-29 MED ORDER — MAGNESIUM HYDROXIDE 400 MG/5ML PO SUSP
30.0000 mL | Freq: Every day | ORAL | Status: DC
Start: 2022-04-29 — End: 2022-04-30
  Administered 2022-04-30: 30 mL via ORAL
  Filled 2022-04-29 (×2): qty 30

## 2022-04-29 MED ORDER — BUPIVACAINE HCL (PF) 0.5 % IJ SOLN
INTRAMUSCULAR | Status: DC | PRN
  Administered 2022-04-29: 3 mL

## 2022-04-29 MED ORDER — DEXAMETHASONE SODIUM PHOSPHATE 10 MG/ML IJ SOLN
INTRAMUSCULAR | Status: AC
  Administered 2022-04-29: 8 mg via INTRAVENOUS
  Filled 2022-04-29: qty 1

## 2022-04-29 SURGICAL SUPPLY — 75 items
ATTUNE PSFEM LTSZ4 NARCEM KNEE (Femur) ×1 IMPLANT
ATTUNE PSRP INSR SZ4 5 KNEE (Insert) ×1 IMPLANT
BASEPLATE TIBIAL ROTATING SZ 4 (Knees) ×1 IMPLANT
BATTERY INSTRU NAVIGATION (MISCELLANEOUS) ×12 IMPLANT
BLADE SAW 70X12.5 (BLADE) ×3 IMPLANT
BLADE SAW 90X13X1.19 OSCILLAT (BLADE) ×3 IMPLANT
BLADE SAW 90X25X1.19 OSCILLAT (BLADE) ×3 IMPLANT
BONE CEMENT GENTAMICIN (Cement) ×4 IMPLANT
BSPLAT TIB 4 CMNT ROT PLAT STR (Knees) ×1 IMPLANT
BTRY SRG DRVR LF (MISCELLANEOUS) ×4
CEMENT BONE GENTAMICIN 40 (Cement) IMPLANT
COOLER POLAR GLACIER W/PUMP (MISCELLANEOUS) ×3 IMPLANT
CUFF TOURN SGL QUICK 24 (TOURNIQUET CUFF)
CUFF TOURN SGL QUICK 34 (TOURNIQUET CUFF)
CUFF TRNQT CYL 24X4X16.5-23 (TOURNIQUET CUFF) IMPLANT
CUFF TRNQT CYL 34X4.125X (TOURNIQUET CUFF) IMPLANT
DRAPE 3/4 80X56 (DRAPES) ×3 IMPLANT
DRAPE INCISE IOBAN 66X45 STRL (DRAPES) IMPLANT
DRSG DERMACEA NONADH 3X8 (GAUZE/BANDAGES/DRESSINGS) ×3 IMPLANT
DRSG MEPILEX SACRM 8.7X9.8 (GAUZE/BANDAGES/DRESSINGS) ×3 IMPLANT
DRSG OPSITE POSTOP 4X14 (GAUZE/BANDAGES/DRESSINGS) ×3 IMPLANT
DRSG TEGADERM 4X4.75 (GAUZE/BANDAGES/DRESSINGS) ×3 IMPLANT
DURAPREP 26ML APPLICATOR (WOUND CARE) ×6 IMPLANT
ELECT CAUTERY BLADE 6.4 (BLADE) ×3 IMPLANT
ELECT REM PT RETURN 9FT ADLT (ELECTROSURGICAL) ×2
ELECTRODE REM PT RTRN 9FT ADLT (ELECTROSURGICAL) ×2 IMPLANT
EX-PIN ORTHOLOCK NAV 4X150 (PIN) ×6 IMPLANT
GLOVE BIO SURGEON STRL SZ7 (GLOVE) ×6 IMPLANT
GLOVE BIOGEL M STRL SZ7.5 (GLOVE) ×6 IMPLANT
GLOVE BIOGEL PI IND STRL 8 (GLOVE) ×2 IMPLANT
GLOVE BIOGEL PI INDICATOR 8 (GLOVE) ×2
GLOVE BIOGEL PI ORTHO PRO 7.5 (GLOVE) ×4
GLOVE PI ORTHO PRO STRL 7.5 (GLOVE) ×4 IMPLANT
GLOVE SURG UNDER POLY LF SZ7.5 (GLOVE) ×3 IMPLANT
GOWN STRL REUS W/ TWL LRG LVL3 (GOWN DISPOSABLE) ×4 IMPLANT
GOWN STRL REUS W/ TWL XL LVL3 (GOWN DISPOSABLE) ×2 IMPLANT
GOWN STRL REUS W/TWL LRG LVL3 (GOWN DISPOSABLE) ×4
GOWN STRL REUS W/TWL XL LVL3 (GOWN DISPOSABLE) ×2
HEMOVAC 400CC 10FR (MISCELLANEOUS) ×3 IMPLANT
HOLDER FOLEY CATH W/STRAP (MISCELLANEOUS) ×3 IMPLANT
HOLSTER ELECTROSUGICAL PENCIL (MISCELLANEOUS) ×3 IMPLANT
HOOD PEEL AWAY FLYTE STAYCOOL (MISCELLANEOUS) ×6 IMPLANT
IV NS IRRIG 3000ML ARTHROMATIC (IV SOLUTION) ×3 IMPLANT
KIT TURNOVER KIT A (KITS) ×3 IMPLANT
KNIFE SCULPS 14X20 (INSTRUMENTS) ×3 IMPLANT
MANIFOLD NEPTUNE II (INSTRUMENTS) ×6 IMPLANT
NDL SPNL 20GX3.5 QUINCKE YW (NEEDLE) ×4 IMPLANT
NEEDLE SPNL 20GX3.5 QUINCKE YW (NEEDLE) ×4 IMPLANT
NS IRRIG 500ML POUR BTL (IV SOLUTION) ×3 IMPLANT
PACK TOTAL KNEE (MISCELLANEOUS) ×3 IMPLANT
PAD ABD DERMACEA PRESS 5X9 (GAUZE/BANDAGES/DRESSINGS) ×6 IMPLANT
PAD WRAPON POLAR KNEE (MISCELLANEOUS) ×2 IMPLANT
PATELLA MEDIAL ATTUN 35MM KNEE (Knees) ×1 IMPLANT
PIN DRILL FIX HALF THREAD (BIT) ×6 IMPLANT
PIN FIXATION 1/8DIA X 3INL (PIN) ×3 IMPLANT
PULSAVAC PLUS IRRIG FAN TIP (DISPOSABLE) ×2
SOL PREP PVP 2OZ (MISCELLANEOUS) ×2
SOLUTION IRRIG SURGIPHOR (IV SOLUTION) ×3 IMPLANT
SOLUTION PREP PVP 2OZ (MISCELLANEOUS) ×2 IMPLANT
SPONGE DRAIN TRACH 4X4 STRL 2S (GAUZE/BANDAGES/DRESSINGS) ×3 IMPLANT
STAPLER SKIN PROX 35W (STAPLE) ×3 IMPLANT
STOCKINETTE IMPERV 14X48 (MISCELLANEOUS) IMPLANT
STRAP TIBIA SHORT (MISCELLANEOUS) ×3 IMPLANT
SUCTION FRAZIER HANDLE 10FR (MISCELLANEOUS) ×2
SUCTION TUBE FRAZIER 10FR DISP (MISCELLANEOUS) ×2 IMPLANT
SUT VIC AB 0 CT1 36 (SUTURE) ×6 IMPLANT
SUT VIC AB 1 CT1 36 (SUTURE) ×6 IMPLANT
SUT VIC AB 2-0 CT2 27 (SUTURE) ×3 IMPLANT
SYR 30ML LL (SYRINGE) ×6 IMPLANT
TIP FAN IRRIG PULSAVAC PLUS (DISPOSABLE) ×2 IMPLANT
TOWEL OR 17X26 4PK STRL BLUE (TOWEL DISPOSABLE) IMPLANT
TOWER CARTRIDGE SMART MIX (DISPOSABLE) ×3 IMPLANT
TRAY FOLEY MTR SLVR 16FR STAT (SET/KITS/TRAYS/PACK) ×3 IMPLANT
WATER STERILE IRR 500ML POUR (IV SOLUTION) ×3 IMPLANT
WRAPON POLAR PAD KNEE (MISCELLANEOUS) ×2

## 2022-04-29 NOTE — H&P (Signed)
The patient has been re-examined, and the chart reviewed, and there have been no interval changes to the documented history and physical.    The risks, benefits, and alternatives have been discussed at length. The patient expressed understanding of the risks benefits and agreed with plans for surgical intervention.  Lynde Ludwig P. Sheran Newstrom, Jr. M.D.    

## 2022-04-29 NOTE — Progress Notes (Signed)
Patient awake/alert x4.  Tolerated po liquids and crackers without event. Able to move bil lower ext, sensation intact, wiggle toes bil. Pain level 2/10 medicated with 100mg  tramadol as ordered. Reviewed procedure with patient, verbalizes understanding of plan of care.

## 2022-04-29 NOTE — Op Note (Signed)
OPERATIVE NOTE  DATE OF SURGERY:  04/29/2022  PATIENT NAME:  April Singleton   DOB: Apr 18, 1951  MRN: [id number]  PRE-OPERATIVE DIAGNOSIS: Degenerative arthrosis of the left knee, primary  POST-OPERATIVE DIAGNOSIS:  Same  PROCEDURE:  Left total knee arthroplasty using computer-assisted navigation  SURGEON:  Jena Gauss. M.D.  ASSISTANT: Baldwin Jamaica, PA-C (present and scrubbed throughout the case, critical for assistance with exposure, retraction, instrumentation, and closure)  ANESTHESIA: spinal  ESTIMATED BLOOD LOSS: 50 mL  FLUIDS REPLACED: 500 mL of crystalloid  TOURNIQUET TIME: 75 minutes  DRAINS: 2 medium Hemovac drains  SOFT TISSUE RELEASES: Anterior cruciate ligament, posterior cruciate ligament, deep medial collateral ligament, patellofemoral ligament  IMPLANTS UTILIZED: DePuy Attune size 4N posterior stabilized femoral component (cemented), size 4 rotating platform tibial component (cemented), 35 mm medialized dome patella (cemented), and a 5 mm stabilized rotating platform polyethylene insert.  INDICATIONS FOR SURGERY: April Singleton is a 71 y.o. year old female with a long history of progressive knee pain. X-rays demonstrated severe degenerative changes in tricompartmental fashion. The patient had not seen any significant improvement despite conservative nonsurgical intervention. After discussion of the risks and benefits of surgical intervention, the patient expressed understanding of the risks benefits and agree with plans for total knee arthroplasty.   The risks, benefits, and alternatives were discussed at length including but not limited to the risks of infection, bleeding, nerve injury, stiffness, blood clots, the need for revision surgery, cardiopulmonary complications, among others, and they were willing to proceed.  PROCEDURE IN DETAIL: The patient was brought into the operating room and, after adequate spinal anesthesia was achieved, a tourniquet was placed  on the patient's upper thigh. The patient's knee and leg were cleaned and prepped with alcohol and DuraPrep and draped in the usual sterile fashion. A "timeout" was performed as per usual protocol. The lower extremity was exsanguinated using an Esmarch, and the tourniquet was inflated to 300 mmHg. An anterior longitudinal incision was made followed by a standard mid vastus approach. The deep fibers of the medial collateral ligament were elevated in a subperiosteal fashion off of the medial flare of the tibia so as to maintain a continuous soft tissue sleeve. The patella was subluxed laterally and the patellofemoral ligament was incised. Inspection of the knee demonstrated severe degenerative changes with full-thickness loss of articular cartilage. Osteophytes were debrided using a rongeur. Anterior and posterior cruciate ligaments were excised. Two 4.0 mm Schanz pins were inserted in the femur and into the tibia for attachment of the array of trackers used for computer-assisted navigation. Hip center was identified using a circumduction technique. Distal landmarks were mapped using the computer. The distal femur and proximal tibia were mapped using the computer. The distal femoral cutting guide was positioned using computer-assisted navigation so as to achieve a 5 distal valgus cut. The femur was sized and it was felt that a size 4N femoral component was appropriate. A size 4 femoral cutting guide was positioned and the anterior cut was performed and verified using the computer. This was followed by completion of the posterior and chamfer cuts. Femoral cutting guide for the central box was then positioned in the center box cut was performed.  Attention was then directed to the proximal tibia. Medial and lateral menisci were excised. The extramedullary tibial cutting guide was positioned using computer-assisted navigation so as to achieve a 0 varus-valgus alignment and 3 posterior slope. The cut was performed  and verified using the computer. The proximal tibia was  sized and it was felt that a size 4 tibial tray was appropriate. Tibial and femoral trials were inserted followed by insertion of a 5 mm polyethylene insert. This allowed for excellent mediolateral soft tissue balancing both in flexion and in full extension. Finally, the patella was cut and prepared so as to accommodate a 35 mm medialized dome patella. A patella trial was placed and the knee was placed through a range of motion with excellent patellar tracking appreciated. The femoral trial was removed after debridement of posterior osteophytes. The central post-hole for the tibial component was reamed followed by insertion of a keel punch. Tibial trials were then removed. Cut surfaces of bone were irrigated with copious amounts of normal saline using pulsatile lavage and then suctioned dry. Polymethylmethacrylate cement with gentamicin was prepared in the usual fashion using a vacuum mixer. Cement was applied to the cut surface of the proximal tibia as well as along the undersurface of a size 4 rotating platform tibial component. Tibial component was positioned and impacted into place. Excess cement was removed using Personal assistant. Cement was then applied to the cut surfaces of the femur as well as along the posterior flanges of the size 4N femoral component. The femoral component was positioned and impacted into place. Excess cement was removed using Personal assistant. A 5 mm polyethylene trial was inserted and the knee was brought into full extension with steady axial compression applied. Finally, cement was applied to the backside of a 35 mm medialized dome patella and the patellar component was positioned and patellar clamp applied. Excess cement was removed using Personal assistant. After adequate curing of the cement, the tourniquet was deflated after a total tourniquet time of 75 minutes. Hemostasis was achieved using electrocautery. The knee was irrigated  with copious amounts of normal saline using pulsatile lavage followed by 450 ml of Surgiphor and then suctioned dry. 20 mL of 1.3% Exparel and 60 mL of 0.25% Marcaine in 40 mL of normal saline was injected along the posterior capsule, medial and lateral gutters, and along the arthrotomy site. A 5 mm stabilized rotating platform polyethylene insert was inserted and the knee was placed through a range of motion with excellent mediolateral soft tissue balancing appreciated and excellent patellar tracking noted. 2 medium drains were placed in the wound bed and brought out through separate stab incisions. The medial parapatellar portion of the incision was reapproximated using interrupted sutures of #1 Vicryl. Subcutaneous tissue was approximated in layers using first #0 Vicryl followed #2-0 Vicryl. The skin was approximated with skin staples. A sterile dressing was applied.  The patient tolerated the procedure well and was transported to the recovery room in stable condition.    Claudene Gatliff P. Angie Fava., M.D.

## 2022-04-29 NOTE — Evaluation (Signed)
Physical Therapy Evaluation Patient Details Name: April Singleton MRN: [id number] DOB: September 25, 1951 Today's Date: 04/29/2022  History of Present Illness  Patient is a 71 year old female with degenerative arthrosis of left knee s/p total knee arthroplasty. Recent diagnosed of right hip strain and right trochanteric bursitis with cortisone injection of the right hip in July. Past medical history of asthma, diabetes, arthritis, chronic kidney disease, anemia, hypertension.   Clinical Impression  Patient is agreeable to PT evaluation. She reports needing a cane or rolling walker recently for ambulation for left knee pain and right hip pain. She lives with her spouse and is planning to return home at discharge.  Today the patient reports minimal L knee pain with activity. Therapeutic exercises initiated and handout provided. The patient is moving well and ambulated in the room with the rolling walker. Occasional cues for gait and proper use of rolling walker. Recommend PT follow up to maximize independence and facilitate return to prior level of function. HHPT recommended at discharge.      Recommendations for follow up therapy are one component of a multi-disciplinary discharge planning process, led by the attending physician.  Recommendations may be updated based on patient status, additional functional criteria and insurance authorization.  Follow Up Recommendations Home health PT      Assistance Recommended at Discharge Set up Supervision/Assistance  Patient can return home with the following  Help with stairs or ramp for entrance;Assist for transportation    Equipment Recommendations BSC/3in1  Recommendations for Other Services       Functional Status Assessment Patient has had a recent decline in their functional status and demonstrates the ability to make significant improvements in function in a reasonable and predictable amount of time.     Precautions / Restrictions  Precautions Precautions: Fall;Knee Precaution Booklet Issued: Yes (comment) Required Braces or Orthoses:  (KI not required as patient able to SLR independently) Restrictions Weight Bearing Restrictions: Yes LLE Weight Bearing: Weight bearing as tolerated      Mobility  Bed Mobility Overal bed mobility: Needs Assistance Bed Mobility: Supine to Sit, Sit to Supine     Supine to sit: Supervision Sit to supine: Supervision   General bed mobility comments: increased time required with no physical assistance needed.    Transfers Overall transfer level: Needs assistance Equipment used: Rolling walker (2 wheels) Transfers: Sit to/from Stand, Bed to chair/wheelchair/BSC Sit to Stand: Min guard   Step pivot transfers: Min guard       General transfer comment: verbal cues for hand placement with transfers and cues for LLE positioning with sitting. the patient performed multiple standing bouts from bed and from bed side commode    Ambulation/Gait Ambulation/Gait assistance: Min guard Gait Distance (Feet): 30 Feet Assistive device: Rolling walker (2 wheels) Gait Pattern/deviations: Step-to pattern, Decreased stride length, Decreased stance time - left, Trunk flexed Gait velocity: decreased     General Gait Details: verbal cues for weight bearing as toleratedthrough LLE, standing closer to rolling walker for optimal support with ambulation to decrease trunk flexion with carry over demonstrated. no dizziness is reported during this session.  Stairs            Wheelchair Mobility    Modified Rankin (Stroke Patients Only)       Balance Overall balance assessment: Needs assistance Sitting-balance support: Feet supported Sitting balance-Leahy Scale: Good Sitting balance - Comments: patient is able to complete peri-care after urinating with set-up and no loss of balance while leaning outside base of  support in sitting position   Standing balance support: No upper extremity  supported Standing balance-Leahy Scale: Fair Standing balance comment: static standing without UE support with no loss of balance. recommended to use rolling walker for UE support with dynamic activity with no loss of balance                             Pertinent Vitals/Pain Pain Assessment Pain Assessment: 0-10 Pain Score: 2  Pain Location: L knee Pain Descriptors / Indicators: Sore Pain Intervention(s): Monitored during session, Limited activity within patient's tolerance, Premedicated before session, Repositioned, Ice applied (polar care re-applied)    Home Living Family/patient expects to be discharged to:: Private residence Living Arrangements: Spouse/significant other Available Help at Discharge: Family Type of Home: House Home Access: Stairs to enter   Technical brewer of Steps: 2   Home Layout: One level Home Equipment: Conservation officer, nature (2 wheels);Shower seat - built in;Hand held Veterinary surgeon - quad      Prior Function Prior Level of Function : Independent/Modified Independent;Working/employed             Mobility Comments: the patient is a Therapist, 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 she has recently been relying on a single point cane or rolling walker for ambulation. was independent up until last month ADLs Comments: independent     Hand Dominance        Extremity/Trunk Assessment   Upper Extremity Assessment Upper Extremity Assessment: Overall WFL for tasks assessed    Lower Extremity Assessment Lower Extremity Assessment: LLE deficits/detail LLE Deficits / Details: patient able to SLR x 5 independently. patient is able to activate ankle, knee, hip movement in gravity eliminated position. no knee buckling with weight bearing LLE Sensation: WNL       Communication   Communication: No difficulties  Cognition Arousal/Alertness: Awake/alert Behavior During Therapy: WFL for tasks assessed/performed Overall Cognitive Status: Within  Functional Limits for tasks assessed                                          General Comments      Exercises Total Joint Exercises Quad Sets: AROM, Strengthening, Left, 10 reps, Supine Straight Leg Raises: AROM, Strengthening, Left, 5 reps, Supine Long Arc Quad: AROM, AAROM, Strengthening, Left, 5 reps, Seated Goniometric ROM: L knee 3-81 degrees   Assessment/Plan    PT Assessment Patient needs continued PT services  PT Problem List Decreased strength;Decreased range of motion;Decreased activity tolerance;Decreased balance;Decreased mobility;Decreased safety awareness;Decreased knowledge of use of DME;Decreased knowledge of precautions;Pain       PT Treatment Interventions DME instruction;Gait training;Stair training;Functional mobility training;Therapeutic activities;Therapeutic exercise;Cognitive remediation;Neuromuscular re-education;Balance training;Patient/family education    PT Goals (Current goals can be found in the Care Plan section)  Acute Rehab PT Goals Patient Stated Goal: to return back and do the things she loves doing with her grandkids PT Goal Formulation: With patient Time For Goal Achievement: 05/13/22 Potential to Achieve Goals: Good Additional Goals Additional Goal #1: patient will increase L knee flexion to 90 degrees for improved gait pattern and functional independence    Frequency BID     Co-evaluation               AM-PAC PT "6 Clicks" Mobility  Outcome Measure Help needed turning from your back to your side while  in a flat bed without using bedrails?: None Help needed moving from lying on your back to sitting on the side of a flat bed without using bedrails?: A Little Help needed moving to and from a bed to a chair (including a wheelchair)?: A Little Help needed standing up from a chair using your arms (e.g., wheelchair or bedside chair)?: A Little Help needed to walk in hospital room?: A Little Help needed climbing 3-5  steps with a railing? : A Little 6 Click Score: 19    End of Session Equipment Utilized During Treatment: Gait belt Activity Tolerance: Patient tolerated treatment well Patient left: in bed;with call bell/phone within reach;with bed alarm set;with SCD's reapplied (polar care re-applied L knee, towel roll under bilateral achilles to promote knee extension) Nurse Communication: Mobility status PT Visit Diagnosis: Muscle weakness (generalized) (M62.81);Other abnormalities of gait and mobility (R26.89)    Time: 1323-1406 PT Time Calculation (min) (ACUTE ONLY): 43 min   Charges:   PT Evaluation $PT Eval Low Complexity: 1 Low PT Treatments $Gait Training: 8-22 mins        Donna Bernard, PT, MPT   Ina Homes 04/29/2022, 2:45 PM

## 2022-04-29 NOTE — Progress Notes (Signed)
Pt arrived via bed from OR to PACU. Pt moving both feet upon arrival, pt able to bend right knee

## 2022-04-29 NOTE — Anesthesia Preprocedure Evaluation (Signed)
Anesthesia Evaluation  Patient identified by MRN, date of birth, ID band Patient awake    Reviewed: Allergy & Precautions, H&P , NPO status , Patient's Chart, lab work & pertinent test results, reviewed documented beta blocker date and time   History of Anesthesia Complications (+) PONV and history of anesthetic complications  Airway Mallampati: III  TM Distance: >3 FB Neck ROM: full  Mouth opening: Limited Mouth Opening  Dental  (+) Dental Advidsory Given, Teeth Intact   Pulmonary neg shortness of breath, asthma , neg sleep apnea, neg COPD, neg recent URI,    Pulmonary exam normal breath sounds clear to auscultation       Cardiovascular Exercise Tolerance: Good hypertension, (-) angina(-) CAD, (-) Past MI and (-) Cardiac Stents Normal cardiovascular exam(-) dysrhythmias (-) Valvular Problems/Murmurs Rhythm:regular Rate:Normal     Neuro/Psych PSYCHIATRIC DISORDERS Depression negative neurological ROS     GI/Hepatic Neg liver ROS, GERD  ,  Endo/Other  diabetes  Renal/GU CRFRenal disease  negative genitourinary   Musculoskeletal   Abdominal   Peds  Hematology negative hematology ROS (+)   Anesthesia Other Findings Past Medical History: No date: Anemia No date: Arthritis No date: Asthma No date: Chronic kidney disease No date: Depression No date: Diabetes mellitus without complication (HCC) No date: GERD (gastroesophageal reflux disease) No date: History of kidney stones No date: Hypertension No date: Pneumonia No date: PONV (postoperative nausea and vomiting)   Reproductive/Obstetrics negative OB ROS                             Anesthesia Physical Anesthesia Plan  ASA: 3  Anesthesia Plan: Spinal   Post-op Pain Management:    Induction: Intravenous  PONV Risk Score and Plan: 4 or greater and Propofol infusion and TIVA  Airway Management Planned: Natural Airway and Nasal  Cannula  Additional Equipment:   Intra-op Plan:   Post-operative Plan:   Informed Consent: I have reviewed the patients History and Physical, chart, labs and discussed the procedure including the risks, benefits and alternatives for the proposed anesthesia with the patient or authorized representative who has indicated his/her understanding and acceptance.     Dental Advisory Given  Plan Discussed with: Anesthesiologist, CRNA and Surgeon  Anesthesia Plan Comments:         Anesthesia Quick Evaluation

## 2022-04-29 NOTE — Transfer of Care (Signed)
Immediate Anesthesia Transfer of Care Note  Patient: TRISH MANCINELLI  Procedure(s) Performed: COMPUTER ASSISTED TOTAL KNEE ARTHROPLASTY (Left: Knee)  Patient Location: PACU  Anesthesia Type:Spinal  Level of Consciousness: drowsy  Airway & Oxygen Therapy: Patient Spontanous Breathing and Patient connected to face mask oxygen  Post-op Assessment: Report given to RN and Post -op Vital signs reviewed and stable  Post vital signs: Reviewed and stable  Last Vitals:  Vitals Value Taken Time  BP 147/71 04/29/22 1046  Temp    Pulse 93 04/29/22 1050  Resp 13 04/29/22 1050  SpO2 100 % 04/29/22 1050  Vitals shown include unvalidated device data.  Last Pain:  Vitals:   04/29/22 0634  TempSrc: Temporal  PainSc: 7          Complications: No notable events documented.

## 2022-04-29 NOTE — TOC Progression Note (Signed)
Transition of Care [facility]) - Progression Note    Patient Details  Name: April Singleton MRN: [id number] Date of Birth: 05-14-1951  Transition of Care [facility] CM/SW Contact  Maree Krabbe, LCSW Phone Number: 04/29/2022, 4:18 PM  Clinical Narrative:  HH prearranged through [facility] via surgeons office prior to surgery.          Expected Discharge Plan and Services                                                 Social Determinants of Health (SDOH) Interventions    Readmission Risk Interventions     No data to display

## 2022-04-29 NOTE — Plan of Care (Signed)

## 2022-04-29 NOTE — Anesthesia Procedure Notes (Addendum)
Spinal  Patient location during procedure: OR Start time: 04/29/2022 7:30 AM End time: 04/29/2022 7:35 AM Reason for block: surgical anesthesia Staffing Anesthesiologist: Lenard Simmer, MD Resident/CRNA: Lynden Oxford, CRNA Other anesthesia staff: Alanson Puls, RN Performed by: Lynden Oxford, CRNA Authorized by: Lenard Simmer, MD   Preanesthetic Checklist Completed: patient identified, IV checked, site marked, risks and benefits discussed, surgical consent, monitors and equipment checked, pre-op evaluation and timeout performed Spinal Block Patient position: sitting Prep: DuraPrep Patient monitoring: heart rate, cardiac monitor, continuous pulse ox and blood pressure Approach: midline Location: L4-5 Injection technique: single-shot Needle Needle type: Sprotte  Needle gauge: 24 G Needle length: 9 cm Assessment Sensory level: T4 Events: CSF return

## 2022-04-30 ENCOUNTER — Other Ambulatory Visit: Payer: Self-pay

## 2022-04-30 ENCOUNTER — Encounter: Payer: Self-pay | Admitting: Orthopedic Surgery

## 2022-04-30 DIAGNOSIS — M1712 Unilateral primary osteoarthritis, left knee: Secondary | ICD-10-CM | POA: Diagnosis not present

## 2022-04-30 LAB — GLUCOSE, CAPILLARY
Glucose-Capillary: 125 mg/dL — ABNORMAL HIGH (ref 70–99)
Glucose-Capillary: 212 mg/dL — ABNORMAL HIGH (ref 70–99)

## 2022-04-30 MED ORDER — OXYCODONE HCL 5 MG PO TABS
5.0000 mg | ORAL_TABLET | ORAL | 0 refills | Status: DC | PRN
  Filled 2022-04-30: qty 30, 5d supply, fill #0

## 2022-04-30 MED ORDER — ENOXAPARIN SODIUM 40 MG/0.4ML IJ SOSY
40.0000 mg | PREFILLED_SYRINGE | INTRAMUSCULAR | 0 refills | Status: DC
  Filled 2022-04-30: qty 5.6, 14d supply, fill #0

## 2022-04-30 MED ORDER — TRAMADOL HCL 50 MG PO TABS
50.0000 mg | ORAL_TABLET | ORAL | 0 refills | Status: DC | PRN
  Filled 2022-04-30: qty 30, 5d supply, fill #0

## 2022-04-30 NOTE — Plan of Care (Signed)
Problem: Education: Goal: Ability to describe self-care measures that may prevent or decrease complications (Diabetes Survival Skills Education) will improve 04/30/2022 1353 by Alferd Apa, RN Outcome: Completed/Met 04/30/2022 0726 by Alferd Apa, RN Outcome: Progressing Goal: Individualized Educational Video(s) 04/30/2022 1353 by Alferd Apa, RN Outcome: Completed/Met 04/30/2022 0726 by Alferd Apa, RN Outcome: Progressing   Problem: Coping: Goal: Ability to adjust to condition or change in health will improve 04/30/2022 1353 by Alferd Apa, RN Outcome: Completed/Met 04/30/2022 0726 by Alferd Apa, RN Outcome: Progressing   Problem: Fluid Volume: Goal: Ability to maintain a balanced intake and output will improve 04/30/2022 1353 by Alferd Apa, RN Outcome: Completed/Met 04/30/2022 0726 by Alferd Apa, RN Outcome: Progressing   Problem: Health Behavior/Discharge Planning: Goal: Ability to identify and utilize available resources and services will improve 04/30/2022 1353 by Alferd Apa, RN Outcome: Completed/Met 04/30/2022 0726 by Alferd Apa, RN Outcome: Progressing Goal: Ability to manage health-related needs will improve 04/30/2022 1353 by Alferd Apa, RN Outcome: Completed/Met 04/30/2022 0726 by Alferd Apa, RN Outcome: Progressing   Problem: Metabolic: Goal: Ability to maintain appropriate glucose levels will improve 04/30/2022 1353 by Alferd Apa, RN Outcome: Completed/Met 04/30/2022 0726 by Alferd Apa, RN Outcome: Progressing   Problem: Nutritional: Goal: Maintenance of adequate nutrition will improve 04/30/2022 1353 by Alferd Apa, RN Outcome: Completed/Met 04/30/2022 0726 by Alferd Apa, RN Outcome: Progressing Goal: Progress toward achieving an optimal weight will improve 04/30/2022 1353 by Alferd Apa, RN Outcome: Completed/Met 04/30/2022 0726 by Alferd Apa, RN Outcome: Progressing   Problem: Skin Integrity: Goal: Risk for impaired skin  integrity will decrease 04/30/2022 1353 by Alferd Apa, RN Outcome: Completed/Met 04/30/2022 0726 by Alferd Apa, RN Outcome: Progressing   Problem: Tissue Perfusion: Goal: Adequacy of tissue perfusion will improve 04/30/2022 1353 by Alferd Apa, RN Outcome: Completed/Met 04/30/2022 0726 by Alferd Apa, RN Outcome: Progressing   Problem: Education: Goal: Knowledge of General Education information will improve Description: Including pain rating scale, medication(s)/side effects and non-pharmacologic comfort measures 04/30/2022 1353 by Alferd Apa, RN Outcome: Completed/Met 04/30/2022 0726 by Alferd Apa, RN Outcome: Progressing   Problem: Health Behavior/Discharge Planning: Goal: Ability to manage health-related needs will improve 04/30/2022 1353 by Alferd Apa, RN Outcome: Completed/Met 04/30/2022 0726 by Alferd Apa, RN Outcome: Progressing   Problem: Clinical Measurements: Goal: Ability to maintain clinical measurements within normal limits will improve 04/30/2022 1353 by Alferd Apa, RN Outcome: Completed/Met 04/30/2022 0726 by Alferd Apa, RN Outcome: Progressing Goal: Will remain free from infection 04/30/2022 1353 by Alferd Apa, RN Outcome: Completed/Met 04/30/2022 0726 by Alferd Apa, RN Outcome: Progressing Goal: Diagnostic test results will improve 04/30/2022 1353 by Alferd Apa, RN Outcome: Completed/Met 04/30/2022 0726 by Alferd Apa, RN Outcome: Progressing Goal: Respiratory complications will improve 04/30/2022 1353 by Alferd Apa, RN Outcome: Completed/Met 04/30/2022 0726 by Alferd Apa, RN Outcome: Progressing Goal: Cardiovascular complication will be avoided 04/30/2022 1353 by Alferd Apa, RN Outcome: Completed/Met 04/30/2022 0726 by Alferd Apa, RN Outcome: Progressing   Problem: Activity: Goal: Risk for activity intolerance will decrease 04/30/2022 1353 by Alferd Apa, RN Outcome: Completed/Met 04/30/2022 0726 by Alferd Apa, RN Outcome:  Progressing   Problem: Nutrition: Goal: Adequate nutrition will be maintained 04/30/2022 1353 by Alferd Apa, RN Outcome: Completed/Met 04/30/2022 0726 by Alferd Apa, RN Outcome: Progressing  Problem: Coping: Goal: Level of anxiety will decrease 04/30/2022 1353 by Alferd Apa, RN Outcome: Completed/Met 04/30/2022 0726 by Alferd Apa, RN Outcome: Progressing   Problem: Elimination: Goal: Will not experience complications related to bowel motility 04/30/2022 1353 by Alferd Apa, RN Outcome: Completed/Met 04/30/2022 0726 by Alferd Apa, RN Outcome: Progressing Goal: Will not experience complications related to urinary retention 04/30/2022 1353 by Alferd Apa, RN Outcome: Completed/Met 04/30/2022 0726 by Alferd Apa, RN Outcome: Progressing   Problem: Pain Managment: Goal: General experience of comfort will improve 04/30/2022 1353 by Alferd Apa, RN Outcome: Completed/Met 04/30/2022 0726 by Alferd Apa, RN Outcome: Progressing   Problem: Safety: Goal: Ability to remain free from injury will improve 04/30/2022 1353 by Alferd Apa, RN Outcome: Completed/Met 04/30/2022 0726 by Alferd Apa, RN Outcome: Progressing   Problem: Skin Integrity: Goal: Risk for impaired skin integrity will decrease 04/30/2022 1353 by Alferd Apa, RN Outcome: Completed/Met 04/30/2022 0726 by Alferd Apa, RN Outcome: Progressing

## 2022-04-30 NOTE — Anesthesia Postprocedure Evaluation (Signed)
Anesthesia Post Note  Patient: April Singleton  Procedure(s) Performed: COMPUTER ASSISTED TOTAL KNEE ARTHROPLASTY (Left: Knee)  Patient location during evaluation: Nursing Unit Anesthesia Type: Spinal Level of consciousness: oriented and awake and alert Pain management: pain level controlled Vital Signs Assessment: post-procedure vital signs reviewed and stable Respiratory status: spontaneous breathing and respiratory function stable Cardiovascular status: blood pressure returned to baseline and stable Postop Assessment: no headache, no backache, no apparent nausea or vomiting and patient able to bend at knees Anesthetic complications: no   No notable events documented.   Last Vitals:  Vitals:   04/30/22 0550 04/30/22 0758  BP: 133/75 (!) 148/80  Pulse: 88 92  Resp: 17 16  Temp: 36.4 C 36.9 C  SpO2: 99% 100%    Last Pain:  Vitals:   04/30/22 0710  TempSrc:   PainSc: 1                  Tomeshia Pizzi Lawerance Cruel

## 2022-04-30 NOTE — Progress Notes (Signed)
Physical Therapy Treatment Patient Details Name: April Singleton MRN: [id number] DOB: 1951-01-17 Today's Date: 04/30/2022   History of Present Illness Patient is a 71 year old female with degenerative arthrosis of left knee s/p total knee arthroplasty. Recent diagnosed of right hip strain and right trochanteric bursitis with cortisone injection of the right hip in July. Past medical history of asthma, diabetes, arthritis, chronic kidney disease, anemia, hypertension.    PT Comments    Patient continues to make progress with functional independence. Improving gait pattern with increased ambulation distance. Patient ambulated the lap around nursing station with supervision using rolling walker. Recommend HHPT follow up at discharge. Patient anticipated to d/c home today.    Recommendations for follow up therapy are one component of a multi-disciplinary discharge planning process, led by the attending physician.  Recommendations may be updated based on patient status, additional functional criteria and insurance authorization.  Follow Up Recommendations  Home health PT     Assistance Recommended at Discharge Set up Supervision/Assistance  Patient can return home with the following Help with stairs or ramp for entrance;Assist for transportation   Equipment Recommendations  BSC/3in1    Recommendations for Other Services       Precautions / Restrictions Precautions Precautions: Fall;Knee Precaution Booklet Issued: Yes (comment) Restrictions Weight Bearing Restrictions: Yes LLE Weight Bearing: Weight bearing as tolerated     Mobility  Bed Mobility               General bed mobility comments: not assessed as patient sitting up on arrival and post session    Transfers Overall transfer level: Modified independent Equipment used: Rolling walker (2 wheels) Transfers: Sit to/from Stand Sit to Stand: Supervision           General transfer comment: cues to have rolling  walker closer to chair with standing for safety    Ambulation/Gait Ambulation/Gait assistance: Supervision Gait Distance (Feet): 175 Feet Assistive device: Rolling walker (2 wheels) Gait Pattern/deviations: Step-through pattern Gait velocity: decreased     General Gait Details: reinforced ambulation with rolling walker with proper use of rolling walker, sequencing of BLE, and heel-toe gait pattern. patient demonstrates without difficulty.   Stairs Stairs: Yes Stairs assistance: Supervision Stair Management: Two rails, Step to pattern, Forwards Number of Stairs: 4 General stair comments: patient navigated 4 steps without difficulty with correct sequencing after initial instruction on correct technique   Wheelchair Mobility    Modified Rankin (Stroke Patients Only)       Balance                                            Cognition Arousal/Alertness: Awake/alert Behavior During Therapy: WFL for tasks assessed/performed Overall Cognitive Status: Within Functional Limits for tasks assessed                                          Exercises Total Joint Exercises Goniometric ROM: L knee 8-96 degrees Other Exercises Other Exercises: patient has been completing exercises in the HEP per her report. reinforcement of exercises and importance of positioning to promote knee extension and flexion as appropriate    General Comments        Pertinent Vitals/Pain Pain Assessment Pain Assessment: No/denies pain Pain Score: 2  Pain Location: L knee  Pain Descriptors / Indicators: Sore Pain Intervention(s): Limited activity within patient's tolerance, Monitored during session, Ice applied (polar care reapplied at end of session)    Home Living Family/patient expects to be discharged to:: Private residence Living Arrangements: Spouse/significant other Available Help at Discharge: Family Type of Home: House Home Access: Stairs to enter    Secretary/administrator of Steps: 2   Home Layout: One level Home Equipment: Agricultural consultant (2 wheels);Shower seat - built in;Hand held Engineer, civil (consulting) - quad      Prior Function            PT Goals (current goals can now be found in the care plan section) Acute Rehab PT Goals Patient Stated Goal: to return back and do the things she loves doing with her grandkids PT Goal Formulation: With patient Time For Goal Achievement: 05/13/22 Potential to Achieve Goals: Good Progress towards PT goals: Progressing toward goals    Frequency    BID      PT Plan Current plan remains appropriate    Co-evaluation              AM-PAC PT "6 Clicks" Mobility   Outcome Measure  Help needed turning from your back to your side while in a flat bed without using bedrails?: None Help needed moving from lying on your back to sitting on the side of a flat bed without using bedrails?: A Little Help needed moving to and from a bed to a chair (including a wheelchair)?: A Little Help needed standing up from a chair using your arms (e.g., wheelchair or bedside chair)?: A Little Help needed to walk in hospital room?: A Little Help needed climbing 3-5 steps with a railing? : A Little 6 Click Score: 19    End of Session Equipment Utilized During Treatment: Gait belt Activity Tolerance: Patient tolerated treatment well Patient left: in chair;with call bell/phone within reach (polar care L knee) Nurse Communication: Mobility status PT Visit Diagnosis: Muscle weakness (generalized) (M62.81);Other abnormalities of gait and mobility (R26.89)     Time: 8841-6606 PT Time Calculation (min) (ACUTE ONLY): 20 min  Charges:  $Gait Training: 8-22 mins                     Donna Bernard, PT, MPT    [facility] [employer] 04/30/2022, 1:08 PM

## 2022-04-30 NOTE — Plan of Care (Signed)
  Problem: Education: Goal: Ability to describe self-care measures that may prevent or decrease complications (Diabetes Survival Skills Education) will improve Outcome: Progressing   Problem: Coping: Goal: Ability to adjust to condition or change in health will improve Outcome: Progressing   Problem: Fluid Volume: Goal: Ability to maintain a balanced intake and output will improve Outcome: Progressing   Problem: Health Behavior/Discharge Planning: Goal: Ability to identify and utilize available resources and services will improve Outcome: Progressing   Problem: Health Behavior/Discharge Planning: Goal: Ability to manage health-related needs will improve Outcome: Progressing   Problem: Metabolic: Goal: Ability to maintain appropriate glucose levels will improve Outcome: Progressing   Problem: Nutritional: Goal: Maintenance of adequate nutrition will improve Outcome: Progressing   Problem: Nutritional: Goal: Progress toward achieving an optimal weight will improve Outcome: Progressing   Problem: Skin Integrity: Goal: Risk for impaired skin integrity will decrease Outcome: Progressing   Problem: Tissue Perfusion: Goal: Adequacy of tissue perfusion will improve Outcome: Progressing   Problem: Education: Goal: Knowledge of General Education information will improve Description: Including pain rating scale, medication(s)/side effects and non-pharmacologic comfort measures Outcome: Progressing   Problem: Health Behavior/Discharge Planning: Goal: Ability to manage health-related needs will improve Outcome: Progressing   Problem: Clinical Measurements: Goal: Ability to maintain clinical measurements within normal limits will improve Outcome: Progressing   Problem: Clinical Measurements: Goal: Will remain free from infection Outcome: Progressing   Problem: Clinical Measurements: Goal: Diagnostic test results will improve Outcome: Progressing   Problem: Clinical  Measurements: Goal: Respiratory complications will improve Outcome: Progressing   Problem: Clinical Measurements: Goal: Cardiovascular complication will be avoided Outcome: Progressing   Problem: Activity: Goal: Risk for activity intolerance will decrease Outcome: Progressing   Problem: Nutrition: Goal: Adequate nutrition will be maintained Outcome: Progressing   Problem: Coping: Goal: Level of anxiety will decrease Outcome: Progressing   Problem: Elimination: Goal: Will not experience complications related to bowel motility Outcome: Progressing   Problem: Elimination: Goal: Will not experience complications related to urinary retention Outcome: Progressing   Problem: Pain Managment: Goal: General experience of comfort will improve Outcome: Progressing   Problem: Safety: Goal: Ability to remain free from injury will improve Outcome: Progressing   Problem: Skin Integrity: Goal: Risk for impaired skin integrity will decrease Outcome: Progressing

## 2022-04-30 NOTE — Progress Notes (Signed)
  Subjective: 1 Day Post-Op Procedure(s) (LRB): COMPUTER ASSISTED TOTAL KNEE ARTHROPLASTY (Left) Patient reports pain as well-controlled.   Patient is well, and has had no acute complaints or problems Plan is to go Home after hospital stay. Negative for chest pain and shortness of breath Fever: no Gastrointestinal: negative for nausea and vomiting.  Patient has not had a bowel movement.  Objective: Vital signs in last 24 hours: Temp:  [97.4 F (36.3 C)-98.5 F (36.9 C)] 98.5 F (36.9 C) (08/01 0758) Pulse Rate:  [85-94] 92 (08/01 0758) Resp:  [12-22] 16 (08/01 0758) BP: (133-159)/(62-80) 148/80 (08/01 0758) SpO2:  [93 %-100 %] 100 % (08/01 0758)  Intake/Output from previous day:  Intake/Output Summary (Last 24 hours) at 04/30/2022 1041 Last data filed at 04/30/2022 0710 Gross per 24 hour  Intake 925 ml  Output 255 ml  Net 670 ml    Intake/Output this shift: Total I/O In: -  Out: 10 [Drains:10]  Labs: Recent Labs    04/29/22 0655  HGB 10.5*   Recent Labs    04/29/22 0655  HCT 31.0*   Recent Labs    04/29/22 0655  NA 139  K 3.7  CL 104  BUN 15  CREATININE 0.70  GLUCOSE 106*   No results for input(s): "LABPT", "INR" in the last 72 hours.   EXAM General - Patient is Alert, Appropriate, and Oriented Extremity - Neurovascular intact Dorsiflexion/Plantar flexion intact Compartment soft Dressing/Incision -Postoperative dressing remains in place., Polar Care in place and working. , Hemovac in place. , Following removal of post-op dressing, mild bloody drainage noted Motor Function - intact, moving foot and toes well on exam. Able to perform independent SLR.  Cardiovascular- Regular rate and rhythm, no murmurs/rubs/gallops Respiratory- Lungs clear to auscultation bilaterally Gastrointestinal- soft, nontender, and active bowel sounds   Assessment/Plan: 1 Day Post-Op Procedure(s) (LRB): COMPUTER ASSISTED TOTAL KNEE ARTHROPLASTY (Left) Principal Problem:    Total knee replacement status  Estimated body mass index is 37.89 kg/m as calculated from the following:   Height as of this encounter: 5' (1.524 m).   Weight as of this encounter: 88 kg. Advance diet Up with therapy  Anticipate d/c home today pending completion of therapy goals.   Post-op dressing removed. , Hemovac removed., Mini compression dressing applied. , and Fresh honeycomb dressing applied.   DVT Prophylaxis - Lovenox, Ted hose, and SCDs Weight-Bearing as tolerated to left leg  Baldwin Jamaica, PA-C [facility] 04/30/2022, 10:41 AM

## 2022-04-30 NOTE — Evaluation (Signed)
Occupational Therapy Evaluation Patient Details Name: April Singleton MRN: [id number] DOB: 1950-10-13 Today's Date: 04/30/2022   History of Present Illness Patient is a 71 year old female with degenerative arthrosis of left knee s/p total knee arthroplasty. Recent diagnosed of right hip strain and right trochanteric bursitis with cortisone injection of the right hip in July. Past medical history of asthma, diabetes, arthritis, chronic kidney disease, anemia, hypertension.   Clinical Impression   Pt seen for OT evaluation this date, POD#1 from above surgery. Pt was independent in all ADL prior to surgery, however using RW for mobility in the past month due to L knee and R hip pain. Pt is eager to return to PLOF with less pain and improved safety and independence. Pt currently requires PRN minimal assist for LB dressing and bathing while in seated position due to pain and limited AROM of L knee. Pt instructed in polar care mgt, falls prevention strategies, home/routines modifications, DME/AE for LB bathing and dressing tasks, pet care considerations, and compression stocking mgt. Pt verbalized understanding and provided with handout to support recall and carryover. Do not currently anticipate any additional skilled acute OT needs following this hospitalization. Will sign off.    Recommendations for follow up therapy are one component of a multi-disciplinary discharge planning process, led by the attending physician.  Recommendations may be updated based on patient status, additional functional criteria and insurance authorization.   Follow Up Recommendations  No OT follow up    Assistance Recommended at Discharge PRN  Patient can return home with the following A little help with bathing/dressing/bathroom;Assist for transportation    Functional Status Assessment  Patient has had a recent decline in their functional status and demonstrates the ability to make significant improvements in function in a  reasonable and predictable amount of time.  Equipment Recommendations  BSC/3in1    Recommendations for Other Services       Precautions / Restrictions Precautions Precautions: Fall;Knee Precaution Booklet Issued: Yes (comment) Restrictions Weight Bearing Restrictions: Yes LLE Weight Bearing: Weight bearing as tolerated      Mobility Bed Mobility                    Transfers                          Balance                                           ADL either performed or assessed with clinical judgement   ADL                                         General ADL Comments: Pt requires PRN MIN A for LB ADL tasks, family able to provide needed level of assist.     Vision         Perception     Praxis      Pertinent Vitals/Pain Pain Assessment Pain Assessment: 0-10 Pain Score: 1  Pain Location: L knee Pain Descriptors / Indicators: Sore Pain Intervention(s): Limited activity within patient's tolerance, Monitored during session, Premedicated before session, Ice applied     Hand Dominance     Extremity/Trunk Assessment Upper Extremity Assessment Upper Extremity Assessment: Overall WFL for tasks  assessed   Lower Extremity Assessment Lower Extremity Assessment: LLE deficits/detail LLE Deficits / Details: s/p TKA       Communication Communication Communication: No difficulties   Cognition Arousal/Alertness: Awake/alert Behavior During Therapy: WFL for tasks assessed/performed Overall Cognitive Status: Within Functional Limits for tasks assessed                                       General Comments       Exercises Other Exercises Other Exercises: Pt educated in home/routines modifications, pet care considerations, falls prevention, AE/DME   Shoulder Instructions      Home Living Family/patient expects to be discharged to:: Private residence Living Arrangements:  Spouse/significant other Available Help at Discharge: Family Type of Home: House Home Access: Stairs to enter Secretary/administrator of Steps: 2   Home Layout: One level     Bathroom Shower/Tub: Tub/shower unit;Walk-in shower   Bathroom Toilet: Standard     Home Equipment: Agricultural consultant (2 wheels);Shower seat - built in;Hand held Engineer, civil (consulting) - quad          Prior Functioning/Environment Prior Level of Function : Independent/Modified Independent;Working/employed             Mobility Comments: 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 she has recently been relying on a single point cane or rolling walker for ambulation. was independent up until last month ADLs Comments: independent        OT Problem List: Pain;Decreased strength;Decreased range of motion      OT Treatment/Interventions:      OT Goals(Current goals can be found in the care plan section) Acute Rehab OT Goals Patient Stated Goal: go home and recover in order to get back to work OT Goal Formulation: All assessment and education complete, DC therapy  OT Frequency:      Co-evaluation              AM-PAC OT "6 Clicks" Daily Activity     Outcome Measure Help from another person eating meals?: None Help from another person taking care of personal grooming?: None Help from another person toileting, which includes using toliet, bedpan, or urinal?: None Help from another person bathing (including washing, rinsing, drying)?: A Little Help from another person to put on and taking off regular upper body clothing?: None Help from another person to put on and taking off regular lower body clothing?: A Little 6 Click Score: 22   End of Session    Activity Tolerance: Patient tolerated treatment well Patient left: in bed;with call bell/phone within reach  OT Visit Diagnosis: Other abnormalities of gait and mobility (R26.89)                Time: 4315-4008 OT Time Calculation  (min): 9 min Charges:  OT General Charges $OT Visit: 1 Visit OT Evaluation $OT Eval Low Complexity: 1 Low  Arman Filter., MPH, MS, OTR/L ascom [phone] 04/30/22, 9:47 AM

## 2022-04-30 NOTE — Plan of Care (Signed)

## 2022-04-30 NOTE — Discharge Summary (Signed)
Physician Discharge Summary  Patient ID: April Singleton MRN: [id number] DOB/AGE: [birth year] 71 y.o.  Admit date: 04/29/2022 Discharge date: 04/30/2022  Admission Diagnoses:  Total knee replacement status [Z96.659]  Surgeries:Procedure(s):  Left total knee arthroplasty using computer-assisted navigation   SURGEON:  Jena Gauss. M.D.   ASSISTANT: Baldwin Jamaica, PA-C (present and scrubbed throughout the case, critical for assistance with exposure, retraction, instrumentation, and closure)   ANESTHESIA: spinal   ESTIMATED BLOOD LOSS: 50 mL   FLUIDS REPLACED: 500 mL of crystalloid   TOURNIQUET TIME: 75 minutes   DRAINS: 2 medium Hemovac drains   SOFT TISSUE RELEASES: Anterior cruciate ligament, posterior cruciate ligament, deep medial collateral ligament, patellofemoral ligament   IMPLANTS UTILIZED: DePuy Attune size 4N posterior stabilized femoral component (cemented), size 4 rotating platform tibial component (cemented), 35 mm medialized dome patella (cemented), and a 5 mm stabilized rotating platform polyethylene insert  Discharge Diagnoses: Patient Active Problem List   Diagnosis Date Noted   Total knee replacement status 04/29/2022   Pain of right hip 04/05/2022   Arthritis of right hip 04/05/2022   Trochanteric bursitis of right hip 04/05/2022   Urinary frequency 03/26/2022   Primary osteoarthritis of left knee 02/10/2022   Panniculitis 06/12/2021   Back pain 06/12/2021   Primary osteoarthritis of right knee 10/26/2020   Dyslipidemia 07/24/2020   Arthritis 07/24/2020   Cirrhosis of liver without ascites (HCC) 07/24/2020   Rotator cuff arthropathy of left shoulder 03/27/2020   Class 2 obesity due to excess calories without serious comorbidity with body mass index (BMI) of 39.0 to 39.9 in adult 03/27/2020   Annual physical exam 03/27/2020   Essential hypertension 03/27/2020   Type 2 diabetes mellitus with hyperglycemia, without long-term current use of insulin  (HCC) 03/15/2020   Burning with urination 03/15/2020   Obesity (BMI 35.0-39.9 without comorbidity) 03/15/2020   Chronic fatigue 03/25/2018   Chronic foot pain, right 06/10/2016   Hyperlipidemia 05/21/2016   Pain in unspecified joint 05/21/2016   Right upper quadrant pain 06/29/2014    Past Medical History:  Diagnosis Date   Anemia    Arthritis    Asthma    Chronic kidney disease    Depression    Diabetes mellitus without complication (HCC)    GERD (gastroesophageal reflux disease)    History of kidney stones    Hypertension    Pneumonia    PONV (postoperative nausea and vomiting)      Transfusion:    Consultants (if any):   Discharged Condition: Improved  Hospital Course: April Singleton is an 71 y.o. female who was admitted 04/29/2022 with a diagnosis of left knee osteoarthritis and went to the operating room on 04/29/2022 and underwent left total knee arthoplasty. The patient received perioperative antibiotics for prophylaxis (see below). The patient tolerated the procedure well and was transported to PACU in stable condition. After meeting PACU criteria, the patient was subsequently transferred to the Orthopaedics/Rehabilitation unit.   The patient received DVT prophylaxis in the form of early mobilization, Lovenox, TED hose, and SCDs . A sacral pad had been placed and heels were elevated off of the bed with rolled towels in order to protect skin integrity. Foley catheter was discontinued on postoperative day #0. Wound drains were discontinued on postoperative day #1. The surgical incision was healing well without signs of infection.  Physical therapy was initiated postoperatively for transfers, gait training, and strengthening. Occupational therapy was initiated for activities of daily living and evaluation for assisted devices. Rehabilitation  goals were reviewed in detail with the patient. The patient made steady progress with physical therapy and physical therapy recommended  discharge to Home.   The patient achieved the preliminary goals of this hospitalization and was felt to be medically and orthopaedically appropriate for discharge.  She was given perioperative antibiotics:  Anti-infectives (From admission, onward)    Start     Dose/Rate Route Frequency Ordered Stop   04/29/22 1330  ceFAZolin (ANCEF) IVPB 2g/100 mL premix        2 g 200 mL/hr over 30 Minutes Intravenous Every 6 hours 04/29/22 1236 04/30/22 0129   04/29/22 0625  ceFAZolin (ANCEF) 2-4 GM/100ML-% IVPB       Note to Pharmacy: Brain HiltsFuentes, Silvia F: cabinet override      04/29/22 0625 04/29/22 0742   04/29/22 0615  ceFAZolin (ANCEF) IVPB 2g/100 mL premix        2 g 200 mL/hr over 30 Minutes Intravenous On call to O.R. 04/29/22 0610 04/29/22 0812   04/29/22 0615  vancomycin (VANCOCIN) IVPB 1000 mg/200 mL premix        1,000 mg 200 mL/hr over 60 Minutes Intravenous  Once 04/29/22 0610 04/29/22 1258     .  Recent vital signs:  Vitals:   04/30/22 0550 04/30/22 0758  BP: 133/75 (!) 148/80  Pulse: 88 92  Resp: 17 16  Temp: 97.6 F (36.4 C) 98.5 F (36.9 C)  SpO2: 99% 100%    Recent laboratory studies:  Recent Labs    04/29/22 0655  HGB 10.5*  HCT 31.0*  K 3.7  CL 104  BUN 15  CREATININE 0.70  GLUCOSE 106*    Diagnostic Studies: DG Knee Left Port  Result Date: 04/29/2022 CLINICAL DATA:  Post LEFT total knee arthroplasty EXAM: PORTABLE LEFT KNEE - 1-2 VIEW COMPARISON:  Portable exam 1109 hours without priors for comparison FINDINGS: Osseous demineralization. Components of LEFT knee prosthesis identified. No acute fracture, dislocation, or bone destruction. Two surgical drains are seen at the suprapatellar knee joint space IMPRESSION: LEFT knee prosthesis without acute complication. Electronically Signed   By: Ulyses SouthwardMark  Boles M.D.   On: 04/29/2022 11:32   CT Abdomen Pelvis W Contrast  Result Date: 04/02/2022 CLINICAL DATA:  Flank pain, kidney stone suspected RLQ abdominal pain (Age >=  14y) UTI, recurrent/complicated (Female). Pt R hip pain that started yesterday. Pt denies any injury states that it happened all of sudden. Denies any previous hx of any surgeries on the R hip. EXAM: CT ABDOMEN AND PELVIS WITH CONTRAST TECHNIQUE: Multidetector CT imaging of the abdomen and pelvis was performed using the standard protocol following bolus administration of intravenous contrast. RADIATION DOSE REDUCTION: This exam was performed according to the departmental dose-optimization program which includes automated exposure control, adjustment of the mA and/or kV according to patient size and/or use of iterative reconstruction technique. CONTRAST:  100mL OMNIPAQUE IOHEXOL 300 MG/ML  SOLN COMPARISON:  None Available. FINDINGS: Lower chest: No acute abnormality. Coronary artery calcification. Small volume hiatal hernia. Hepatobiliary: Nodular hepatic contour no focal liver abnormality. No gallstones, gallbladder wall thickening, or pericholecystic fluid. No biliary dilatation. Pancreas: No focal lesion. Normal pancreatic contour. No surrounding inflammatory changes. No main pancreatic ductal dilatation. Spleen: Normal in size without focal abnormality. Adrenals/Urinary Tract: No adrenal nodule bilaterally. Bilateral kidneys enhance symmetrically. Bilateral nephrolithiasis measuring up to 3 mm. No hydronephrosis. No hydroureter. The urinary bladder is unremarkable. On delayed imaging, there is no urothelial wall thickening and there are no filling defects in the opacified  portions of the bilateral collecting systems or ureters. Stomach/Bowel: Gastric sleeve formation surgical changes. Stomach is within normal limits. No evidence of bowel wall thickening or dilatation. Stool throughout the majority of the colon. Colonic diverticulosis. Appendix appears normal. Vascular/Lymphatic: No abdominal aorta or iliac aneurysm. Severe atherosclerotic plaque of the aorta and its branches. No abdominal, pelvic, or inguinal  lymphadenopathy. Reproductive: Status post hysterectomy. No adnexal masses. Other: No intraperitoneal free fluid. No intraperitoneal free gas. No organized fluid collection. Musculoskeletal: No abdominal wall hernia or abnormality. No suspicious lytic or blastic osseous lesions. No acute displaced fracture. Multilevel degenerative changes of the spine. IMPRESSION: 1. Nonobstructive bilateral nephrolithiasis measuring 1 mm. 2. Constipation. 3. Cirrhosis with no definite finding of portal hypertension. No focal liver lesions identified. Please note that liver protocol enhanced MR and CT are the most sensitive tests for the screening detection of hepatocellular carcinoma in the high risk setting of cirrhosis. 4. Small hiatal hernia in the setting of gastric sleeve surgical changes. 5. Colonic diverticulosis with no acute diverticulitis. 6.  Aortic Atherosclerosis (ICD10-I70.0). Electronically Signed   By: Tish Frederickson M.D.   On: 04/02/2022 16:42   DG Hip Unilat W or Wo Pelvis 2-3 Views Right  Result Date: 04/02/2022 CLINICAL DATA:  Acute RIGHT hip pain for 1 day. No known injury. Initial encounter. EXAM: DG HIP (WITH OR WITHOUT PELVIS) 2-3V RIGHT COMPARISON:  03/15/2021 abdominal and pelvic CT and prior studies. FINDINGS: There is no evidence of acute fracture, subluxation or dislocation. Mild hip joint space narrowing/osteophytosis noted. No focal bony lesions are identified. IMPRESSION: 1. No evidence of acute abnormality. 2. Mild degenerative changes. Electronically Signed   By: Harmon Pier M.D.   On: 04/02/2022 14:17    Discharge Medications:   Allergies as of 04/30/2022       Reactions   Codeine Other (See Comments)   Nausea and vomiting   Penicillins    IgE = 95 (WNL) on 04/18/2022   Prednisone Other (See Comments)   Makes her violent , patient reports only in high doses   Sulfa Antibiotics Hives        Medication List     STOP taking these medications    HYDROcodone-acetaminophen 5-325  MG tablet Commonly known as: NORCO/VICODIN   naproxen 500 MG tablet Commonly known as: Naprosyn       TAKE these medications    acetaminophen 500 MG tablet Commonly known as: TYLENOL Take 1,000 mg by mouth every 6 (six) hours as needed.   Asenapine Maleate 2.5 MG Subl Commonly known as: Saphris Take 1 tablet under the tongue at bedtime (Take 1 at bedtime)   Asenapine Maleate 2.5 MG Subl Commonly known as: Saphris Take 1 tablet by mouth at bedtime. (Take 1 at bedtime)   celecoxib 200 MG capsule Commonly known as: CELEBREX Take 1 capsule (200 mg total) by mouth 2 (two) times daily   clotrimazole-betamethasone cream Commonly known as: LOTRISONE Apply to the affected area(s) daily (Apply 1 application topically daily.)   cyclobenzaprine 10 MG tablet Commonly known as: FLEXERIL Take 1 tablet (10 mg total) by mouth 3 (three) times daily as needed for muscle spasms for up to 10 days (Take 1 tablet (10 mg total) by mouth 3 (three) times daily as needed for Muscle spasms for up to 10 days For muscle spasm)   enoxaparin 40 MG/0.4ML injection Commonly known as: LOVENOX Inject 0.4 mLs (40 mg total) into the skin daily for 14 days.   estradiol 1 MG tablet  Commonly known as: ESTRACE TAKE 1 TABLET (1 MG TOTAL) BY MOUTH DAILY.   hydrochlorothiazide 12.5 MG tablet Commonly known as: HYDRODIURIL TAKE 1 TABLET BY MOUTH ONCE DAILY WITH LOSARTAN   ipratropium 0.06 % nasal spray Commonly known as: ATROVENT Place 2 sprays into both nostrils 3 (three) times daily as needed for Rhinitis   losartan 50 MG tablet Commonly known as: COZAAR TAKE 1 TABLET BY MOUTH DAILY WITH HCTZ   losartan-hydrochlorothiazide 50-12.5 MG tablet Commonly known as: HYZAAR TAKE 1 TABLET BY MOUTH DAILY   metFORMIN 500 MG 24 hr tablet Commonly known as: GLUCOPHAGE-XR TAKE 1 TABLET BY MOUTH TWICE DAILY   ondansetron 4 MG disintegrating tablet Commonly known as: ZOFRAN-ODT Take 1 tablet (4 mg total) by  mouth every 8 (eight) hours as needed for nausea or vomiting.   oxyCODONE 5 MG immediate release tablet Commonly known as: Oxy IR/ROXICODONE Take 1 tablet (5 mg total) by mouth every 4 (four) hours as needed for moderate pain (pain score 4-6).   Ozempic (1 MG/DOSE) 4 MG/3ML Sopn Generic drug: Semaglutide (1 MG/DOSE) Inject 1 mg as directed once a week.   pantoprazole 40 MG tablet Commonly known as: PROTONIX Take 1 tablet (40 mg total) by mouth as directed. Take 30-45 minutes before breakfast.   predniSONE 20 MG tablet Commonly known as: DELTASONE Take 1 tablet (20 mg total) by mouth daily with breakfast.   sertraline 100 MG tablet Commonly known as: Zoloft Take 1 and 1/2 tablets by mouth daily with breakfast (Take 1 and 1/2 tablet po daily with breakfast)   traMADol 50 MG tablet Commonly known as: ULTRAM Take 1 tablet (50 mg total) by mouth every 4 (four) hours as needed for moderate pain.   verapamil 240 MG 24 hr capsule Commonly known as: VERELAN PM Take 1 capsule (240 mg total) by mouth daily.   Vyvanse 20 MG capsule Generic drug: lisdexamfetamine Take one capsule by mouth daily (Take 1 daily)   Vyvanse 20 MG capsule Generic drug: lisdexamfetamine Take 1 capsule by mouth daily (Take 1 daily)   Vyvanse 20 MG capsule Generic drug: lisdexamfetamine Take 1 tablet by mouth daily (Take 1 daily)   zolpidem 5 MG tablet Commonly known as: Ambien Take 1 tablet (5 mg total) by mouth at bedtime as needed for sleep.   zolpidem 5 MG tablet Commonly known as: AMBIEN TAKE 1 TABLET BY MOUTH AT BEDTIME AS NEEDED FOR SLEEP               Durable Medical Equipment  (From admission, onward)           Start     Ordered   04/29/22 1237  DME Walker rolling  Once       Question:  Patient needs a walker to treat with the following condition  Answer:  Total knee replacement status   04/29/22 1236   04/29/22 1237  DME Bedside commode  Once       Question:  Patient  needs a bedside commode to treat with the following condition  Answer:  Total knee replacement status   04/29/22 1236            Disposition: Home with home health PT     Follow-up Information     Madelyn Flavors, PA-C Follow up on 05/14/2022.   Specialty: Orthopedic Surgery Why: at 1:45pm Contact information: [address] [facility] [place] [address] [phone]  Donato Heinz, MD Follow up on 06/11/2022.   Specialty: Orthopedic Surgery Why: at 2:15pm Contact information: [address] [facility] [place] [address] [phone]                  Lasandra Beech, PA-C 04/30/2022, 10:46 AM

## 2022-04-30 NOTE — Progress Notes (Addendum)
Physical Therapy Treatment Patient Details Name: April Singleton MRN: [id number] DOB: 05-Apr-1951 Today's Date: 04/30/2022   History of Present Illness Patient is a 71 year old female with degenerative arthrosis of left knee s/p total knee arthroplasty. Recent diagnosed of right hip strain and right trochanteric bursitis with cortisone injection of the right hip in July. Past medical history of asthma, diabetes, arthritis, chronic kidney disease, anemia, hypertension.    PT Comments    Patient sitting up on the edge of bed on arrival to room. She is eager to ambulate and be discharged home later today. The patient is making excellent progress with functional independence with mobility. She ambulated a lap in the hallway with improving gait pattern with increased gait distance. Stair training is completed this session. Left knee flexion also increased to 96 degrees today. Pain seems well controlled and rated at a 2-3/10 after mobilizing in the left knee. PT will follow up while in the hospital to maximize independence and facilitate return to prior level of function. Mobility is adequate for discharge home.    Recommendations for follow up therapy are one component of a multi-disciplinary discharge planning process, led by the attending physician.  Recommendations may be updated based on patient status, additional functional criteria and insurance authorization.  Follow Up Recommendations  Home health PT     Assistance Recommended at Discharge Set up Supervision/Assistance  Patient can return home with the following Help with stairs or ramp for entrance;Assist for transportation   Equipment Recommendations  BSC/3in1    Recommendations for Other Services       Precautions / Restrictions Precautions Precautions: Fall;Knee Restrictions Weight Bearing Restrictions: Yes LLE Weight Bearing: Weight bearing as tolerated     Mobility  Bed Mobility               General bed mobility  comments: not assessed as patient sitting up on arrival and post session    Transfers Overall transfer level: Needs assistance Equipment used: Rolling walker (2 wheels) Transfers: Sit to/from Stand Sit to Stand: Supervision           General transfer comment: verbal cues for hand placement for safety. instructed patient on safe car transfer technique and patient verbalized understanding    Ambulation/Gait Ambulation/Gait assistance: Supervision Gait Distance (Feet): 180 Feet Assistive device: Rolling walker (2 wheels) Gait Pattern/deviations: Step-through pattern, Decreased stance time - left Gait velocity: decreased     General Gait Details: mild decreased stand time on LLE initially that improved with increased ambulation distance. education provided for proper use of rolling walker (avoid picking walker up off the ground) and for smooth transitions with turns. patient demonstrated heel-toe gait pattern. no increased pain is reported with ambulation   Stairs Stairs: Yes Stairs assistance: Supervision Stair Management: Two rails, Step to pattern, Forwards Number of Stairs: 4 General stair comments: patient navigated 4 steps without difficulty with correct sequencing after initial instruction on correct technique   Wheelchair Mobility    Modified Rankin (Stroke Patients Only)       Balance                                            Cognition Arousal/Alertness: Awake/alert Behavior During Therapy: WFL for tasks assessed/performed Overall Cognitive Status: Within Functional Limits for tasks assessed  Exercises Total Joint Exercises Goniometric ROM: L knee 8-96 degrees Other Exercises Other Exercises: patient has been completing exercises in the HEP per her report. reinforcement of exercises and importance of positioning to promote knee extension and flexion as appropriate    General  Comments        Pertinent Vitals/Pain Pain Assessment Pain Assessment: 0-10 Pain Score: 2  Pain Location: L knee Pain Descriptors / Indicators: Sore Pain Intervention(s): Limited activity within patient's tolerance, Monitored during session, Ice applied (polar care reapplied at end of session)    Home Living                          Prior Function            PT Goals (current goals can now be found in the care plan section) Acute Rehab PT Goals Patient Stated Goal: to return back and do the things she loves doing with her grandkids PT Goal Formulation: With patient Time For Goal Achievement: 05/13/22 Potential to Achieve Goals: Good Progress towards PT goals: Progressing toward goals    Frequency    BID      PT Plan Current plan remains appropriate    Co-evaluation              AM-PAC PT "6 Clicks" Mobility   Outcome Measure  Help needed turning from your back to your side while in a flat bed without using bedrails?: None Help needed moving from lying on your back to sitting on the side of a flat bed without using bedrails?: A Little Help needed moving to and from a bed to a chair (including a wheelchair)?: A Little Help needed standing up from a chair using your arms (e.g., wheelchair or bedside chair)?: A Little Help needed to walk in hospital room?: A Little Help needed climbing 3-5 steps with a railing? : A Little 6 Click Score: 19    End of Session Equipment Utilized During Treatment: Gait belt Activity Tolerance: Patient tolerated treatment well Patient left: in chair;with call bell/phone within reach;with SCD's reapplied (polar care L knee, towel roll L achilles to promote knee extension) Nurse Communication: Mobility status PT Visit Diagnosis: Muscle weakness (generalized) (M62.81);Other abnormalities of gait and mobility (R26.89)     Time: 0093-8182 PT Time Calculation (min) (ACUTE ONLY): 23 min  Charges:  $Gait Training: 23-37  mins                Donna Bernard, PT, MPT    Ina Homes 04/30/2022, 9:13 AM

## 2022-05-01 DIAGNOSIS — M1711 Unilateral primary osteoarthritis, right knee: Secondary | ICD-10-CM | POA: Diagnosis not present

## 2022-05-01 DIAGNOSIS — M1611 Unilateral primary osteoarthritis, right hip: Secondary | ICD-10-CM | POA: Diagnosis not present

## 2022-05-01 DIAGNOSIS — Z471 Aftercare following joint replacement surgery: Secondary | ICD-10-CM | POA: Diagnosis not present

## 2022-05-01 DIAGNOSIS — I129 Hypertensive chronic kidney disease with stage 1 through stage 4 chronic kidney disease, or unspecified chronic kidney disease: Secondary | ICD-10-CM | POA: Diagnosis not present

## 2022-05-01 DIAGNOSIS — N189 Chronic kidney disease, unspecified: Secondary | ICD-10-CM | POA: Diagnosis not present

## 2022-05-01 DIAGNOSIS — E785 Hyperlipidemia, unspecified: Secondary | ICD-10-CM | POA: Diagnosis not present

## 2022-05-01 DIAGNOSIS — M7061 Trochanteric bursitis, right hip: Secondary | ICD-10-CM | POA: Diagnosis not present

## 2022-05-01 DIAGNOSIS — D631 Anemia in chronic kidney disease: Secondary | ICD-10-CM | POA: Diagnosis not present

## 2022-05-01 DIAGNOSIS — E1122 Type 2 diabetes mellitus with diabetic chronic kidney disease: Secondary | ICD-10-CM | POA: Diagnosis not present

## 2022-05-03 DIAGNOSIS — D631 Anemia in chronic kidney disease: Secondary | ICD-10-CM | POA: Diagnosis not present

## 2022-05-03 DIAGNOSIS — M1611 Unilateral primary osteoarthritis, right hip: Secondary | ICD-10-CM | POA: Diagnosis not present

## 2022-05-03 DIAGNOSIS — E785 Hyperlipidemia, unspecified: Secondary | ICD-10-CM | POA: Diagnosis not present

## 2022-05-03 DIAGNOSIS — M1711 Unilateral primary osteoarthritis, right knee: Secondary | ICD-10-CM | POA: Diagnosis not present

## 2022-05-03 DIAGNOSIS — M7061 Trochanteric bursitis, right hip: Secondary | ICD-10-CM | POA: Diagnosis not present

## 2022-05-03 DIAGNOSIS — N189 Chronic kidney disease, unspecified: Secondary | ICD-10-CM | POA: Diagnosis not present

## 2022-05-03 DIAGNOSIS — E1122 Type 2 diabetes mellitus with diabetic chronic kidney disease: Secondary | ICD-10-CM | POA: Diagnosis not present

## 2022-05-03 DIAGNOSIS — I129 Hypertensive chronic kidney disease with stage 1 through stage 4 chronic kidney disease, or unspecified chronic kidney disease: Secondary | ICD-10-CM | POA: Diagnosis not present

## 2022-05-03 DIAGNOSIS — Z471 Aftercare following joint replacement surgery: Secondary | ICD-10-CM | POA: Diagnosis not present

## 2022-05-04 DIAGNOSIS — M1611 Unilateral primary osteoarthritis, right hip: Secondary | ICD-10-CM | POA: Diagnosis not present

## 2022-05-04 DIAGNOSIS — N189 Chronic kidney disease, unspecified: Secondary | ICD-10-CM | POA: Diagnosis not present

## 2022-05-04 DIAGNOSIS — E785 Hyperlipidemia, unspecified: Secondary | ICD-10-CM | POA: Diagnosis not present

## 2022-05-04 DIAGNOSIS — M1711 Unilateral primary osteoarthritis, right knee: Secondary | ICD-10-CM | POA: Diagnosis not present

## 2022-05-04 DIAGNOSIS — E1122 Type 2 diabetes mellitus with diabetic chronic kidney disease: Secondary | ICD-10-CM | POA: Diagnosis not present

## 2022-05-04 DIAGNOSIS — Z471 Aftercare following joint replacement surgery: Secondary | ICD-10-CM | POA: Diagnosis not present

## 2022-05-04 DIAGNOSIS — M7061 Trochanteric bursitis, right hip: Secondary | ICD-10-CM | POA: Diagnosis not present

## 2022-05-04 DIAGNOSIS — I129 Hypertensive chronic kidney disease with stage 1 through stage 4 chronic kidney disease, or unspecified chronic kidney disease: Secondary | ICD-10-CM | POA: Diagnosis not present

## 2022-05-04 DIAGNOSIS — D631 Anemia in chronic kidney disease: Secondary | ICD-10-CM | POA: Diagnosis not present

## 2022-05-06 DIAGNOSIS — Z96652 Presence of left artificial knee joint: Secondary | ICD-10-CM | POA: Diagnosis not present

## 2022-05-07 DIAGNOSIS — N189 Chronic kidney disease, unspecified: Secondary | ICD-10-CM | POA: Diagnosis not present

## 2022-05-07 DIAGNOSIS — E785 Hyperlipidemia, unspecified: Secondary | ICD-10-CM | POA: Diagnosis not present

## 2022-05-07 DIAGNOSIS — M1711 Unilateral primary osteoarthritis, right knee: Secondary | ICD-10-CM | POA: Diagnosis not present

## 2022-05-07 DIAGNOSIS — I129 Hypertensive chronic kidney disease with stage 1 through stage 4 chronic kidney disease, or unspecified chronic kidney disease: Secondary | ICD-10-CM | POA: Diagnosis not present

## 2022-05-07 DIAGNOSIS — Z471 Aftercare following joint replacement surgery: Secondary | ICD-10-CM | POA: Diagnosis not present

## 2022-05-07 DIAGNOSIS — E1122 Type 2 diabetes mellitus with diabetic chronic kidney disease: Secondary | ICD-10-CM | POA: Diagnosis not present

## 2022-05-07 DIAGNOSIS — M1611 Unilateral primary osteoarthritis, right hip: Secondary | ICD-10-CM | POA: Diagnosis not present

## 2022-05-07 DIAGNOSIS — M7061 Trochanteric bursitis, right hip: Secondary | ICD-10-CM | POA: Diagnosis not present

## 2022-05-07 DIAGNOSIS — D631 Anemia in chronic kidney disease: Secondary | ICD-10-CM | POA: Diagnosis not present

## 2022-05-08 DIAGNOSIS — Z471 Aftercare following joint replacement surgery: Secondary | ICD-10-CM | POA: Diagnosis not present

## 2022-05-08 DIAGNOSIS — M7061 Trochanteric bursitis, right hip: Secondary | ICD-10-CM | POA: Diagnosis not present

## 2022-05-08 DIAGNOSIS — M1711 Unilateral primary osteoarthritis, right knee: Secondary | ICD-10-CM | POA: Diagnosis not present

## 2022-05-08 DIAGNOSIS — D631 Anemia in chronic kidney disease: Secondary | ICD-10-CM | POA: Diagnosis not present

## 2022-05-08 DIAGNOSIS — M1611 Unilateral primary osteoarthritis, right hip: Secondary | ICD-10-CM | POA: Diagnosis not present

## 2022-05-08 DIAGNOSIS — I129 Hypertensive chronic kidney disease with stage 1 through stage 4 chronic kidney disease, or unspecified chronic kidney disease: Secondary | ICD-10-CM | POA: Diagnosis not present

## 2022-05-08 DIAGNOSIS — E1122 Type 2 diabetes mellitus with diabetic chronic kidney disease: Secondary | ICD-10-CM | POA: Diagnosis not present

## 2022-05-08 DIAGNOSIS — E785 Hyperlipidemia, unspecified: Secondary | ICD-10-CM | POA: Diagnosis not present

## 2022-05-08 DIAGNOSIS — N189 Chronic kidney disease, unspecified: Secondary | ICD-10-CM | POA: Diagnosis not present

## 2022-05-10 DIAGNOSIS — E785 Hyperlipidemia, unspecified: Secondary | ICD-10-CM | POA: Diagnosis not present

## 2022-05-10 DIAGNOSIS — M1711 Unilateral primary osteoarthritis, right knee: Secondary | ICD-10-CM | POA: Diagnosis not present

## 2022-05-10 DIAGNOSIS — I129 Hypertensive chronic kidney disease with stage 1 through stage 4 chronic kidney disease, or unspecified chronic kidney disease: Secondary | ICD-10-CM | POA: Diagnosis not present

## 2022-05-10 DIAGNOSIS — M7061 Trochanteric bursitis, right hip: Secondary | ICD-10-CM | POA: Diagnosis not present

## 2022-05-10 DIAGNOSIS — N189 Chronic kidney disease, unspecified: Secondary | ICD-10-CM | POA: Diagnosis not present

## 2022-05-10 DIAGNOSIS — E1122 Type 2 diabetes mellitus with diabetic chronic kidney disease: Secondary | ICD-10-CM | POA: Diagnosis not present

## 2022-05-10 DIAGNOSIS — M1611 Unilateral primary osteoarthritis, right hip: Secondary | ICD-10-CM | POA: Diagnosis not present

## 2022-05-10 DIAGNOSIS — Z471 Aftercare following joint replacement surgery: Secondary | ICD-10-CM | POA: Diagnosis not present

## 2022-05-10 DIAGNOSIS — D631 Anemia in chronic kidney disease: Secondary | ICD-10-CM | POA: Diagnosis not present

## 2022-05-13 DIAGNOSIS — M7061 Trochanteric bursitis, right hip: Secondary | ICD-10-CM | POA: Diagnosis not present

## 2022-05-13 DIAGNOSIS — E785 Hyperlipidemia, unspecified: Secondary | ICD-10-CM | POA: Diagnosis not present

## 2022-05-13 DIAGNOSIS — I129 Hypertensive chronic kidney disease with stage 1 through stage 4 chronic kidney disease, or unspecified chronic kidney disease: Secondary | ICD-10-CM | POA: Diagnosis not present

## 2022-05-13 DIAGNOSIS — D631 Anemia in chronic kidney disease: Secondary | ICD-10-CM | POA: Diagnosis not present

## 2022-05-13 DIAGNOSIS — E1122 Type 2 diabetes mellitus with diabetic chronic kidney disease: Secondary | ICD-10-CM | POA: Diagnosis not present

## 2022-05-13 DIAGNOSIS — Z471 Aftercare following joint replacement surgery: Secondary | ICD-10-CM | POA: Diagnosis not present

## 2022-05-13 DIAGNOSIS — M1711 Unilateral primary osteoarthritis, right knee: Secondary | ICD-10-CM | POA: Diagnosis not present

## 2022-05-13 DIAGNOSIS — N189 Chronic kidney disease, unspecified: Secondary | ICD-10-CM | POA: Diagnosis not present

## 2022-05-13 DIAGNOSIS — M1611 Unilateral primary osteoarthritis, right hip: Secondary | ICD-10-CM | POA: Diagnosis not present

## 2022-05-14 ENCOUNTER — Other Ambulatory Visit: Payer: Self-pay

## 2022-05-14 DIAGNOSIS — G8929 Other chronic pain: Secondary | ICD-10-CM | POA: Diagnosis not present

## 2022-05-14 DIAGNOSIS — M25562 Pain in left knee: Secondary | ICD-10-CM | POA: Diagnosis not present

## 2022-05-14 MED ORDER — AMOXICILLIN 500 MG PO CAPS
ORAL_CAPSULE | ORAL | 1 refills | Status: DC
  Filled 2022-05-14: qty 4, 1d supply, fill #0

## 2022-05-15 ENCOUNTER — Other Ambulatory Visit: Payer: Self-pay

## 2022-05-15 MED ORDER — QUETIAPINE FUMARATE 50 MG PO TABS
ORAL_TABLET | ORAL | 0 refills | Status: DC
  Filled 2022-05-15: qty 30, 30d supply, fill #0

## 2022-05-16 ENCOUNTER — Other Ambulatory Visit: Payer: Self-pay

## 2022-05-16 MED ORDER — QUETIAPINE FUMARATE 50 MG PO TABS
ORAL_TABLET | ORAL | 0 refills | Status: DC
  Filled 2022-05-16: qty 30, 30d supply, fill #0

## 2022-05-17 ENCOUNTER — Other Ambulatory Visit: Payer: Self-pay

## 2022-05-17 ENCOUNTER — Encounter: Payer: Self-pay | Admitting: Orthopedic Surgery

## 2022-05-17 DIAGNOSIS — G8929 Other chronic pain: Secondary | ICD-10-CM | POA: Diagnosis not present

## 2022-05-17 DIAGNOSIS — M25562 Pain in left knee: Secondary | ICD-10-CM | POA: Diagnosis not present

## 2022-05-17 MED ORDER — TIZANIDINE HCL 2 MG PO TABS
ORAL_TABLET | ORAL | 0 refills | Status: DC
  Filled 2022-05-17: qty 30, 10d supply, fill #0

## 2022-05-20 ENCOUNTER — Other Ambulatory Visit: Payer: Self-pay

## 2022-05-20 DIAGNOSIS — M25562 Pain in left knee: Secondary | ICD-10-CM | POA: Diagnosis not present

## 2022-05-20 DIAGNOSIS — G8929 Other chronic pain: Secondary | ICD-10-CM | POA: Diagnosis not present

## 2022-05-21 ENCOUNTER — Other Ambulatory Visit: Payer: Self-pay

## 2022-05-21 DIAGNOSIS — G4700 Insomnia, unspecified: Secondary | ICD-10-CM | POA: Diagnosis not present

## 2022-05-21 DIAGNOSIS — F5081 Binge eating disorder: Secondary | ICD-10-CM | POA: Diagnosis not present

## 2022-05-21 DIAGNOSIS — F3181 Bipolar II disorder: Secondary | ICD-10-CM | POA: Diagnosis not present

## 2022-05-21 DIAGNOSIS — F4310 Post-traumatic stress disorder, unspecified: Secondary | ICD-10-CM | POA: Diagnosis not present

## 2022-05-21 MED ORDER — SERTRALINE HCL 100 MG PO TABS
ORAL_TABLET | ORAL | 0 refills | Status: DC
  Filled 2022-05-21: qty 180, 90d supply, fill #0

## 2022-05-21 MED ORDER — SERTRALINE HCL 100 MG PO TABS: ORAL_TABLET | ORAL | 0 refills | Status: DC

## 2022-05-21 MED ORDER — OLANZAPINE 5 MG PO TABS
ORAL_TABLET | ORAL | 0 refills | Status: DC
  Filled 2022-05-21: qty 30, 30d supply, fill #0

## 2022-05-22 DIAGNOSIS — M25562 Pain in left knee: Secondary | ICD-10-CM | POA: Diagnosis not present

## 2022-05-22 DIAGNOSIS — F5081 Binge eating disorder: Secondary | ICD-10-CM | POA: Diagnosis not present

## 2022-05-22 DIAGNOSIS — F3181 Bipolar II disorder: Secondary | ICD-10-CM | POA: Diagnosis not present

## 2022-05-22 DIAGNOSIS — F4310 Post-traumatic stress disorder, unspecified: Secondary | ICD-10-CM | POA: Diagnosis not present

## 2022-05-22 DIAGNOSIS — G8929 Other chronic pain: Secondary | ICD-10-CM | POA: Diagnosis not present

## 2022-05-23 DIAGNOSIS — M1611 Unilateral primary osteoarthritis, right hip: Secondary | ICD-10-CM | POA: Diagnosis not present

## 2022-05-23 DIAGNOSIS — E1122 Type 2 diabetes mellitus with diabetic chronic kidney disease: Secondary | ICD-10-CM | POA: Diagnosis not present

## 2022-05-23 DIAGNOSIS — M1711 Unilateral primary osteoarthritis, right knee: Secondary | ICD-10-CM | POA: Diagnosis not present

## 2022-05-23 DIAGNOSIS — E785 Hyperlipidemia, unspecified: Secondary | ICD-10-CM | POA: Diagnosis not present

## 2022-05-23 DIAGNOSIS — M7061 Trochanteric bursitis, right hip: Secondary | ICD-10-CM | POA: Diagnosis not present

## 2022-05-23 DIAGNOSIS — I129 Hypertensive chronic kidney disease with stage 1 through stage 4 chronic kidney disease, or unspecified chronic kidney disease: Secondary | ICD-10-CM | POA: Diagnosis not present

## 2022-05-23 DIAGNOSIS — N189 Chronic kidney disease, unspecified: Secondary | ICD-10-CM | POA: Diagnosis not present

## 2022-05-23 DIAGNOSIS — D631 Anemia in chronic kidney disease: Secondary | ICD-10-CM | POA: Diagnosis not present

## 2022-05-23 DIAGNOSIS — Z471 Aftercare following joint replacement surgery: Secondary | ICD-10-CM | POA: Diagnosis not present

## 2022-05-27 ENCOUNTER — Other Ambulatory Visit: Payer: Self-pay

## 2022-05-27 DIAGNOSIS — G8929 Other chronic pain: Secondary | ICD-10-CM | POA: Diagnosis not present

## 2022-05-27 DIAGNOSIS — M25562 Pain in left knee: Secondary | ICD-10-CM | POA: Diagnosis not present

## 2022-05-27 MED ORDER — CEPHALEXIN 500 MG PO CAPS
ORAL_CAPSULE | ORAL | 0 refills | Status: DC
  Filled 2022-05-27: qty 40, 10d supply, fill #0

## 2022-05-28 ENCOUNTER — Other Ambulatory Visit: Payer: Self-pay

## 2022-05-28 DIAGNOSIS — F5081 Binge eating disorder: Secondary | ICD-10-CM | POA: Diagnosis not present

## 2022-05-28 DIAGNOSIS — G4700 Insomnia, unspecified: Secondary | ICD-10-CM | POA: Diagnosis not present

## 2022-05-28 DIAGNOSIS — F3181 Bipolar II disorder: Secondary | ICD-10-CM | POA: Diagnosis not present

## 2022-05-28 DIAGNOSIS — F4310 Post-traumatic stress disorder, unspecified: Secondary | ICD-10-CM | POA: Diagnosis not present

## 2022-05-28 MED ORDER — TRAZODONE HCL 50 MG PO TABS
ORAL_TABLET | ORAL | 0 refills | Status: DC
  Filled 2022-05-28: qty 30, 30d supply, fill #0

## 2022-05-28 MED ORDER — OLANZAPINE 5 MG PO TABS
ORAL_TABLET | ORAL | 0 refills | Status: DC
  Filled 2022-05-28: qty 90, 90d supply, fill #0

## 2022-05-29 DIAGNOSIS — G8929 Other chronic pain: Secondary | ICD-10-CM | POA: Diagnosis not present

## 2022-05-29 DIAGNOSIS — M25562 Pain in left knee: Secondary | ICD-10-CM | POA: Diagnosis not present

## 2022-06-04 DIAGNOSIS — G8929 Other chronic pain: Secondary | ICD-10-CM | POA: Diagnosis not present

## 2022-06-04 DIAGNOSIS — M25562 Pain in left knee: Secondary | ICD-10-CM | POA: Diagnosis not present

## 2022-06-06 DIAGNOSIS — M25562 Pain in left knee: Secondary | ICD-10-CM | POA: Diagnosis not present

## 2022-06-06 DIAGNOSIS — G8929 Other chronic pain: Secondary | ICD-10-CM | POA: Diagnosis not present

## 2022-06-10 DIAGNOSIS — G8929 Other chronic pain: Secondary | ICD-10-CM | POA: Diagnosis not present

## 2022-06-10 DIAGNOSIS — M25562 Pain in left knee: Secondary | ICD-10-CM | POA: Diagnosis not present

## 2022-06-11 ENCOUNTER — Other Ambulatory Visit: Payer: Self-pay

## 2022-06-11 DIAGNOSIS — Z96652 Presence of left artificial knee joint: Secondary | ICD-10-CM | POA: Diagnosis not present

## 2022-06-11 MED ORDER — CLONAZEPAM 0.5 MG PO TABS
ORAL_TABLET | ORAL | 0 refills | Status: DC
  Filled 2022-06-11: qty 20, 10d supply, fill #0

## 2022-06-12 ENCOUNTER — Ambulatory Visit
Admission: EM | Admit: 2022-06-12 | Discharge: 2022-06-12 | Disposition: A | Discharge status: Home / Self Care | Payer: 59 | Attending: Emergency Medicine | Admitting: Emergency Medicine

## 2022-06-12 DIAGNOSIS — R300 Dysuria: Secondary | ICD-10-CM | POA: Diagnosis not present

## 2022-06-12 LAB — POCT URINALYSIS DIP (MANUAL ENTRY)
Bilirubin, UA: NEGATIVE
Glucose, UA: NEGATIVE mg/dL
Ketones, POC UA: NEGATIVE mg/dL
Nitrite, UA: NEGATIVE
Spec Grav, UA: 1.02 (ref 1.010–1.025)
Urobilinogen, UA: 0.2 E.U./dL
pH, UA: 6.5 (ref 5.0–8.0)

## 2022-06-12 MED ORDER — CEPHALEXIN 500 MG PO CAPS: 500.0000 mg | ORAL_CAPSULE | Freq: Two times a day (BID) | ORAL | 0 refills | Status: DC

## 2022-06-12 NOTE — ED Provider Notes (Signed)
Roderic Palau    CSN: [id number] Arrival date & time: 06/12/22  1726      History   Chief Complaint Chief Complaint  Patient presents with   Urinary Frequency   urinary burning     HPI April Singleton is a 71 y.o. female.  Patient presents with dysuria, urinary frequency, bladder spasms x 1 day.  She also reports generalized lower abdominal discomfort.  No hematuria, fever, nausea, vomiting, diarrhea, constipation, or other symptoms.  No OTC treatment.  Her medical history includes diabetes, hypertension, CKD, asthma, cirrhosis, chronic fatigue.  The history is provided by the patient and medical records.    Past Medical History:  Diagnosis Date   Anemia    Arthritis    Asthma    Chronic kidney disease    Depression    Diabetes mellitus without complication ([place])    GERD (gastroesophageal reflux disease)    History of kidney stones    Hypertension    Pneumonia    PONV (postoperative nausea and vomiting)     Patient Active Problem List   Diagnosis Date Noted   Total knee replacement status 04/29/2022   Pain of right hip 04/05/2022   Arthritis of right hip 04/05/2022   Trochanteric bursitis of right hip 04/05/2022   Urinary frequency 03/26/2022   Primary osteoarthritis of left knee 02/10/2022   Panniculitis 06/12/2021   Back pain 06/12/2021   Primary osteoarthritis of right knee 10/26/2020   Dyslipidemia 07/24/2020   Arthritis 07/24/2020   Cirrhosis of liver without ascites ([place]) 07/24/2020   Rotator cuff arthropathy of left shoulder 03/27/2020   Class 2 obesity due to excess calories without serious comorbidity with body mass index (BMI) of 39.0 to 39.9 in adult 03/27/2020   Annual physical exam 03/27/2020   Essential hypertension 03/27/2020   Type 2 diabetes mellitus with hyperglycemia, without long-term current use of insulin ([place]) 03/15/2020   Burning with urination 03/15/2020   Obesity (BMI 35.0-39.9 without comorbidity) 03/15/2020   Chronic  fatigue 03/25/2018   Chronic foot pain, right 06/10/2016   Hyperlipidemia 05/21/2016   Pain in unspecified joint 05/21/2016   Right upper quadrant pain 06/29/2014    Past Surgical History:  Procedure Laterality Date   ABDOMINAL HYSTERECTOMY     BREAST BIOPSY     Pt unsure of type of biopsy or even of which side   KNEE ARTHROPLASTY Left 04/29/2022   Procedure: COMPUTER ASSISTED TOTAL KNEE ARTHROPLASTY;  Surgeon: Dereck Leep, MD;  Location: [facility] ORS;  Service: Orthopedics;  Laterality: Left;   TONSILLECTOMY      OB History     Gravida  3   Para  3   Term  3   Preterm      AB      Living  3      SAB      IAB      Ectopic      Multiple      Live Births  3            Home Medications    Prior to Admission medications   Medication Sig Start Date End Date Taking? Authorizing Provider  cephALEXin (KEFLEX) 500 MG capsule Take 1 capsule (500 mg total) by mouth 2 (two) times daily for 5 days. 06/12/22 06/17/22 Yes Sharion Balloon, NP  acetaminophen (TYLENOL) 500 MG tablet Take 1,000 mg by mouth every 6 (six) hours as needed.    [provider]  amoxicillin (AMOXIL) 500  MG capsule Take 4 capsules 1 hour before the dental procedure 05/14/22     Asenapine Maleate (SAPHRIS) 2.5 MG SUBL Take 1 at bedtime 01/16/22     Asenapine Maleate (SAPHRIS) 2.5 MG SUBL Take 1 at bedtime 04/26/22     celecoxib (CELEBREX) 200 MG capsule Take 1 capsule (200 mg total) by mouth 2 (two) times daily 10/31/21     clonazePAM (KLONOPIN) 0.5 MG tablet Take 1 twice daily as needed 06/10/22     clotrimazole-betamethasone (LOTRISONE) cream Apply 1 application topically daily. 10/12/21   Cletis Athens, MD  cyclobenzaprine (FLEXERIL) 10 MG tablet Take 1 tablet (10 mg total) by mouth 3 (three) times daily as needed for Muscle spasms for up to 10 days For muscle spasm 04/09/22     enoxaparin (LOVENOX) 40 MG/0.4ML injection Inject 0.4 mLs (40 mg total) into the skin daily for 14 days. 04/30/22 05/14/22   Fausto Skillern, PA-C  estradiol (ESTRACE) 1 MG tablet TAKE 1 TABLET (1 MG TOTAL) BY MOUTH DAILY. 04/05/22 04/05/23  Cletis Athens, MD  hydrochlorothiazide (HYDRODIURIL) 12.5 MG tablet TAKE 1 TABLET BY MOUTH ONCE DAILY WITH LOSARTAN 10/03/21 10/03/22  Cletis Athens, MD  ipratropium (ATROVENT) 0.06 % nasal spray Place 2 sprays into both nostrils 3 (three) times daily as needed for Rhinitis 11/07/21     lisdexamfetamine (VYVANSE) 20 MG capsule Take 1 daily 10/02/21     lisdexamfetamine (VYVANSE) 20 MG capsule Take 1 daily 10/23/21     lisdexamfetamine (VYVANSE) 20 MG capsule Take 1 daily 04/26/22     losartan (COZAAR) 50 MG tablet TAKE 1 TABLET BY MOUTH DAILY WITH HCTZ 10/03/21 10/05/22  Cletis Athens, MD  losartan-hydrochlorothiazide (HYZAAR) 50-12.5 MG tablet TAKE 1 TABLET BY MOUTH DAILY Patient not taking: Reported on 04/29/2022 10/02/20   Cletis Athens, MD  metFORMIN (GLUCOPHAGE-XR) 500 MG 24 hr tablet TAKE 1 TABLET BY MOUTH TWICE DAILY 06/11/21   Cletis Athens, MD  OLANZapine (ZYPREXA) 5 MG tablet Take 1 at bedtime 05/28/22     ondansetron (ZOFRAN-ODT) 4 MG disintegrating tablet Take 1 tablet (4 mg total) by mouth every 8 (eight) hours as needed for nausea or vomiting. 02/08/22   Corena Herter, PA-C  oxyCODONE (OXY IR/ROXICODONE) 5 MG immediate release tablet Take 1 tablet (5 mg total) by mouth every 4 (four) hours as needed for moderate pain (pain score 4-6). 04/30/22   Fausto Skillern, PA-C  pantoprazole (PROTONIX) 40 MG tablet Take 1 tablet (40 mg total) by mouth as directed. Take 30-45 minutes before breakfast. 02/01/22     predniSONE (DELTASONE) 20 MG tablet Take 1 tablet (20 mg total) by mouth daily with breakfast. 04/05/22   Theresia Lo, NP  QUEtiapine (SEROQUEL) 50 MG tablet Take 1 at bedtime 05/15/22     QUEtiapine (SEROQUEL) 50 MG tablet Take 1 at bedtime 05/16/22     Semaglutide, 1 MG/DOSE, 4 MG/3ML SOPN Inject 1 mg as directed once a week. 08/14/21   Cletis Athens, MD  sertraline (ZOLOFT) 100 MG  tablet Take 1 and 1/2 tablet po daily with breakfast 01/16/22     sertraline (ZOLOFT) 100 MG tablet Take 2 dailywtih breakfast 05/21/22     sertraline (ZOLOFT) 100 MG tablet Take 1 and 1/2 tablet po daily with breakfast 05/21/22     tiZANidine (ZANAFLEX) 2 MG tablet Take 1 tablet (2 mg total) by mouth every 8 (eight) hours as needed for Muscle spasms 05/17/22     traMADol (ULTRAM) 50 MG tablet Take 1 tablet (50  mg total) by mouth every 4 (four) hours as needed for moderate pain. 04/30/22   Fausto Skillern, PA-C  traZODone (DESYREL) 50 MG tablet Take 1 at bedtime as needed 05/28/22     verapamil (VERELAN PM) 240 MG 24 hr capsule Take 1 capsule (240 mg total) by mouth daily. 03/12/22   Cletis Athens, MD  zolpidem (AMBIEN) 5 MG tablet Take 1 tablet (5 mg total) by mouth at bedtime as needed for sleep. 03/26/22   Cletis Athens, MD  zolpidem (AMBIEN) 5 MG tablet TAKE 1 TABLET BY MOUTH AT BEDTIME AS NEEDED FOR SLEEP 03/26/22   Cletis Athens, MD    Family History Family History  Problem Relation Age of Onset   Liver cancer Father    Lung cancer Father    Breast cancer Maternal Aunt     Social History Social History   Tobacco Use   Smoking status: Never   Smokeless tobacco: Never  Vaping Use   Vaping Use: Never used  Substance Use Topics   Alcohol use: Yes    Comment: rarely   Drug use: Never     Allergies   Codeine, Penicillins, Prednisone, and Sulfa antibiotics   Review of Systems Review of Systems  Constitutional:  Negative for chills and fever.  Eyes:  Negative for visual disturbance.  Gastrointestinal:  Positive for abdominal pain. Negative for constipation, diarrhea, nausea and vomiting.  Genitourinary:  Positive for dysuria and frequency. Negative for hematuria.  All other systems reviewed and are negative.    Physical Exam Triage Vital Signs ED Triage Vitals  Enc Vitals Group     BP 06/12/22 1752 122/79     Pulse Rate 06/12/22 1752 90     Resp 06/12/22 1752 17     Temp  06/12/22 1752 97.7 F (36.5 C)     Temp src --      SpO2 06/12/22 1752 98 %     Weight --      Height --      Head Circumference --      Peak Flow --      Pain Score 06/12/22 1750 9     Pain Loc --      Pain Edu? --      Excl. in [place]? --    No data found.  Updated Vital Signs BP 122/79   Pulse 90   Temp 97.7 F (36.5 C)   Resp 17   SpO2 98%   Visual Acuity Right Eye Distance:   Left Eye Distance:   Bilateral Distance:    Right Eye Near:   Left Eye Near:    Bilateral Near:     Physical Exam Vitals and nursing note reviewed.  Constitutional:      General: She is not in acute distress.    Appearance: She is well-developed. She is obese. She is not ill-appearing.  HENT:     Mouth/Throat:     Mouth: Mucous membranes are moist.  Cardiovascular:     Rate and Rhythm: Normal rate and regular rhythm.     Heart sounds: Normal heart sounds.  Pulmonary:     Effort: Pulmonary effort is normal. No respiratory distress.     Breath sounds: Normal breath sounds.  Abdominal:     General: Bowel sounds are normal.     Palpations: Abdomen is soft.     Tenderness: There is no abdominal tenderness. There is no right CVA tenderness, left CVA tenderness, guarding or rebound.  Musculoskeletal:  Cervical back: Neck supple.  Skin:    General: Skin is warm and dry.  Neurological:     Mental Status: She is alert.  Psychiatric:        Mood and Affect: Mood normal.        Behavior: Behavior normal.      UC Treatments / Results  Labs (all labs ordered are listed, but only abnormal results are displayed) Labs Reviewed  POCT URINALYSIS DIP (MANUAL ENTRY) - Abnormal; Notable for the following components:      Result Value   Blood, UA trace-intact (*)    Protein Ur, POC trace (*)    Leukocytes, UA Large (3+) (*)    All other components within normal limits  URINE CULTURE    EKG   Radiology No results found.  Procedures Procedures (including critical care  time)  Medications Ordered in UC Medications - No data to display  Initial Impression / Assessment and Plan / UC Course  I have reviewed the triage vital signs and the nursing notes.  Pertinent labs & imaging results that were available during my care of the patient were reviewed by me and considered in my medical decision making (see chart for details).   Dysuria.  Treating with Keflex. Urine culture pending. Discussed with patient that we will call her if the urine culture shows the need to change or discontinue the antibiotic. Instructed her to follow-up with her PCP.  Patient agrees to plan of care.      Final Clinical Impressions(s) / UC Diagnoses   Final diagnoses:  Dysuria     Discharge Instructions      Take the antibiotic as directed.  The urine culture is pending.  We will call you if it shows the need to change or discontinue your antibiotic.    Follow up with your primary care provider.        ED Prescriptions     Medication Sig Dispense Auth. Provider   cephALEXin (KEFLEX) 500 MG capsule Take 1 capsule (500 mg total) by mouth 2 (two) times daily for 5 days. 10 capsule Sharion Balloon, NP      PDMP not reviewed this encounter.   Sharion Balloon, NP 06/12/22 1825

## 2022-06-12 NOTE — ED Triage Notes (Signed)
Pt. States that today she started having urinary frequency, burning and spasms. Pt. Ha treated herself w/  OTC medication.

## 2022-06-12 NOTE — Discharge Instructions (Addendum)
Take the antibiotic as directed.  The urine culture is pending.  We will call you if it shows the need to change or discontinue your antibiotic.    Follow up with your primary care provider.

## 2022-06-14 LAB — URINE CULTURE: Culture: >=100000 — AB

## 2022-06-17 ENCOUNTER — Encounter: Payer: Self-pay | Admitting: Internal Medicine

## 2022-06-17 ENCOUNTER — Ambulatory Visit (INDEPENDENT_AMBULATORY_CARE_PROVIDER_SITE_OTHER): Payer: 59 | Admitting: Internal Medicine

## 2022-06-17 VITALS — BP 139/84 | HR 72 | Ht 60.0 in | Wt 187.5 lb

## 2022-06-17 DIAGNOSIS — E6609 Other obesity due to excess calories: Secondary | ICD-10-CM | POA: Diagnosis not present

## 2022-06-17 DIAGNOSIS — E1165 Type 2 diabetes mellitus with hyperglycemia: Secondary | ICD-10-CM

## 2022-06-17 DIAGNOSIS — I10 Essential (primary) hypertension: Secondary | ICD-10-CM

## 2022-06-17 DIAGNOSIS — Z23 Encounter for immunization: Secondary | ICD-10-CM

## 2022-06-17 DIAGNOSIS — Z6839 Body mass index (BMI) 39.0-39.9, adult: Secondary | ICD-10-CM

## 2022-06-17 DIAGNOSIS — E785 Hyperlipidemia, unspecified: Secondary | ICD-10-CM | POA: Diagnosis not present

## 2022-06-17 NOTE — Progress Notes (Signed)
Established Patient Office Visit  Subjective:  Patient ID: April Singleton, female    DOB: Apr 09, 1951  Age: 71 y.o. MRN: [id number]  CC:  Chief Complaint  Patient presents with   Follow-up    HPI  April Singleton presents for check up  Past Medical History:  Diagnosis Date   Anemia    Arthritis    Asthma    Chronic kidney disease    Depression    Diabetes mellitus without complication ([facility])    GERD (gastroesophageal reflux disease)    History of kidney stones    Hypertension    Pneumonia    PONV (postoperative nausea and vomiting)     Past Surgical History:  Procedure Laterality Date   ABDOMINAL HYSTERECTOMY     BREAST BIOPSY     Pt unsure of type of biopsy or even of which side   KNEE ARTHROPLASTY Left 04/29/2022   Procedure: COMPUTER ASSISTED TOTAL KNEE ARTHROPLASTY;  Surgeon: Dereck Leep, MD;  Location: [facility] ORS;  Service: Orthopedics;  Laterality: Left;   TONSILLECTOMY      Family History  Problem Relation Age of Onset   Liver cancer Father    Lung cancer Father    Breast cancer Maternal Aunt     Social History   Socioeconomic History   Marital status: Married    Spouse name: Danny   Number of children: 3   Years of education: Not on file   Highest education level: Not on file  Occupational History   Not on file  Tobacco Use   Smoking status: Never   Smokeless tobacco: Never  Vaping Use   Vaping Use: Never used  Substance and Sexual Activity   Alcohol use: Yes    Comment: rarely   Drug use: Never   Sexual activity: Yes    Birth control/protection: Surgical    Comment: Hysterectomy  Other Topics Concern   Not on file  Social History Narrative   3 grandchildren and daughter in the home   Social Determinants of Health   Financial Resource Strain: Low Risk  (10/12/2021)   Overall Financial Resource Strain (CARDIA)    Difficulty of Paying Living Expenses: Not hard at all  Food Insecurity: No Food Insecurity (10/12/2021)   Hunger Vital  Sign    Worried About Running Out of Food in the Last Year: Never true    Ran Out of Food in the Last Year: Never true  Transportation Needs: No Transportation Needs (10/12/2021)   PRAPARE - Hydrologist (Medical): No    Lack of Transportation (Non-Medical): No  Physical Activity: Sufficiently Active (10/12/2021)   Exercise Vital Sign    Days of Exercise per Week: 4 days    Minutes of Exercise per Session: 40 min  Stress: No Stress Concern Present (10/12/2021)   [place]    Feeling of Stress : Not at all  Social Connections: [place] (10/12/2021)   Social Connection and Isolation Panel [NHANES]    Frequency of Communication with Friends and Family: More than three times a week    Frequency of Social Gatherings with Friends and Family: More than three times a week    Attends Religious Services: More than 4 times per year    Active Member of [employer] or Organizations: Yes    Attends Music therapist: More than 4 times per year    Marital Status: Married  Human resources officer  Violence: Not At Risk (10/12/2021)   Humiliation, Afraid, Rape, and Kick questionnaire    Fear of Current or Ex-Partner: No    Emotionally Abused: No    Physically Abused: No    Sexually Abused: No     Current Outpatient Medications:    acetaminophen (TYLENOL) 500 MG tablet, Take 1,000 mg by mouth every 6 (six) hours as needed., Disp: , Rfl:    Asenapine Maleate (SAPHRIS) 2.5 MG SUBL, Take 1 at bedtime, Disp: 90 tablet, Rfl: 0   celecoxib (CELEBREX) 200 MG capsule, Take 1 capsule (200 mg total) by mouth 2 (two) times daily, Disp: 60 capsule, Rfl: 3   clonazePAM (KLONOPIN) 0.5 MG tablet, Take 1 twice daily as needed, Disp: 20 tablet, Rfl: 0   clotrimazole-betamethasone (LOTRISONE) cream, Apply 1 application topically daily., Disp: 45 g, Rfl: 4   cyclobenzaprine (FLEXERIL) 10 MG tablet, Take 1 tablet (10 mg  total) by mouth 3 (three) times daily as needed for Muscle spasms for up to 10 days For muscle spasm, Disp: 30 tablet, Rfl: 0   estradiol (ESTRACE) 1 MG tablet, TAKE 1 TABLET (1 MG TOTAL) BY MOUTH DAILY., Disp: 30 tablet, Rfl: 6   hydrochlorothiazide (HYDRODIURIL) 12.5 MG tablet, TAKE 1 TABLET BY MOUTH ONCE DAILY WITH LOSARTAN, Disp: 90 tablet, Rfl: 3   ipratropium (ATROVENT) 0.06 % nasal spray, Place 2 sprays into both nostrils 3 (three) times daily as needed for Rhinitis, Disp: 15 mL, Rfl: 0   lisdexamfetamine (VYVANSE) 20 MG capsule, Take 1 daily, Disp: 90 capsule, Rfl: 0   losartan (COZAAR) 50 MG tablet, TAKE 1 TABLET BY MOUTH DAILY WITH HCTZ, Disp: 90 tablet, Rfl: 3   losartan-hydrochlorothiazide (HYZAAR) 50-12.5 MG tablet, TAKE 1 TABLET BY MOUTH DAILY, Disp: 90 tablet, Rfl: 3   metFORMIN (GLUCOPHAGE-XR) 500 MG 24 hr tablet, TAKE 1 TABLET BY MOUTH TWICE DAILY, Disp: 60 tablet, Rfl: 6   OLANZapine (ZYPREXA) 5 MG tablet, Take 1 at bedtime, Disp: 90 tablet, Rfl: 0   ondansetron (ZOFRAN-ODT) 4 MG disintegrating tablet, Take 1 tablet (4 mg total) by mouth every 8 (eight) hours as needed for nausea or vomiting., Disp: 20 tablet, Rfl: 0   oxyCODONE (OXY IR/ROXICODONE) 5 MG immediate release tablet, Take 1 tablet (5 mg total) by mouth every 4 (four) hours as needed for moderate pain (pain score 4-6)., Disp: 30 tablet, Rfl: 0   pantoprazole (PROTONIX) 40 MG tablet, Take 1 tablet (40 mg total) by mouth as directed. Take 30-45 minutes before breakfast., Disp: 30 tablet, Rfl: 6   QUEtiapine (SEROQUEL) 50 MG tablet, Take 1 at bedtime, Disp: 30 tablet, Rfl: 0   Semaglutide, 1 MG/DOSE, 4 MG/3ML SOPN, Inject 1 mg as directed once a week., Disp: 3 mL, Rfl: 12   sertraline (ZOLOFT) 100 MG tablet, Take 1 and 1/2 tablet po daily with breakfast, Disp: 135 tablet, Rfl: 1   tiZANidine (ZANAFLEX) 2 MG tablet, Take 1 tablet (2 mg total) by mouth every 8 (eight) hours as needed for Muscle spasms, Disp: 30 tablet, Rfl: 0    traMADol (ULTRAM) 50 MG tablet, Take 1 tablet (50 mg total) by mouth every 4 (four) hours as needed for moderate pain., Disp: 30 tablet, Rfl: 0   traZODone (DESYREL) 50 MG tablet, Take 1 at bedtime as needed, Disp: 30 tablet, Rfl: 0   verapamil (VERELAN PM) 240 MG 24 hr capsule, Take 1 capsule (240 mg total) by mouth daily., Disp: 90 capsule, Rfl: 3   zolpidem (AMBIEN) 5 MG tablet,  Take 1 tablet (5 mg total) by mouth at bedtime as needed for sleep., Disp: 30 tablet, Rfl: 0   enoxaparin (LOVENOX) 40 MG/0.4ML injection, Inject 0.4 mLs (40 mg total) into the skin daily for 14 days., Disp: 5.6 mL, Rfl: 0   Allergies  Allergen Reactions   Codeine Other (See Comments)    Nausea and vomiting   Penicillins     IgE = 95 (WNL) on 04/18/2022   Prednisone Other (See Comments)    Makes her violent , patient reports only in high doses   Sulfa Antibiotics Hives    ROS Review of Systems  Constitutional: Negative.   HENT: Negative.    Eyes: Negative.   Respiratory: Negative.    Cardiovascular: Negative.   Gastrointestinal: Negative.   Endocrine: Negative.   Genitourinary: Negative.   Musculoskeletal: Negative.   Skin: Negative.   Allergic/Immunologic: Negative.   Neurological: Negative.   Hematological: Negative.   Psychiatric/Behavioral: Negative.    All other systems reviewed and are negative.     Objective:    Physical Exam Vitals reviewed.  Constitutional:      Appearance: Normal appearance.  HENT:     Mouth/Throat:     Mouth: Mucous membranes are moist.  Eyes:     Pupils: Pupils are equal, round, and reactive to light.  Neck:     Vascular: No carotid bruit.  Cardiovascular:     Rate and Rhythm: Normal rate and regular rhythm.     Pulses: Normal pulses.     Heart sounds: Normal heart sounds.  Pulmonary:     Effort: Pulmonary effort is normal.     Breath sounds: Normal breath sounds.  Abdominal:     General: Bowel sounds are normal.     Palpations: Abdomen is soft. There  is no hepatomegaly, splenomegaly or mass.     Tenderness: There is no abdominal tenderness.     Hernia: No hernia is present.  Musculoskeletal:        General: No tenderness.     Cervical back: Neck supple.     Right lower leg: No edema.     Left lower leg: No edema.  Skin:    Findings: No rash.  Neurological:     Mental Status: She is alert and oriented to person, place, and time.     Motor: No weakness.  Psychiatric:        Mood and Affect: Mood and affect normal.        Behavior: Behavior normal.     BP 139/84   Pulse 72   Ht 5' (1.524 m)   Wt 187 lb 8 oz (85 kg)   BMI 36.62 kg/m  Wt Readings from Last 3 Encounters:  06/17/22 187 lb 8 oz (85 kg)  04/29/22 194 lb (88 kg)  04/05/22 194 lb (88 kg)     Health Maintenance Due  Topic Date Due   FOOT EXAM  Never done   DEXA SCAN  Never done   Diabetic kidney evaluation - Urine ACR  02/18/2020   COVID-19 Vaccine (4 - Pfizer series) 06/29/2021   INFLUENZA VACCINE  04/30/2022    There are no preventive care reminders to display for this patient.  Lab Results  Component Value Date   TSH 1.94 10/29/2021   Lab Results  Component Value Date   WBC 7.6 04/18/2022   HGB 10.5 (L) 04/29/2022   HCT 31.0 (L) 04/29/2022   MCV 74.8 (L) 04/18/2022   PLT 363 04/18/2022  Lab Results  Component Value Date   NA 139 04/29/2022   K 3.7 04/29/2022   CO2 28 04/18/2022   GLUCOSE 106 (H) 04/29/2022   BUN 15 04/29/2022   CREATININE 0.70 04/29/2022   BILITOT 0.4 04/18/2022   ALKPHOS 75 04/18/2022   AST 23 04/18/2022   ALT 19 04/18/2022   PROT 8.0 04/18/2022   ALBUMIN 3.6 04/18/2022   CALCIUM 9.4 04/18/2022   ANIONGAP 8 04/18/2022   EGFR 83 10/29/2021   Lab Results  Component Value Date   CHOL 265 (H) 10/29/2021   Lab Results  Component Value Date   HDL 77 10/29/2021   Lab Results  Component Value Date   LDLCALC 155 (H) 10/29/2021   Lab Results  Component Value Date   TRIG 189 (H) 10/29/2021   Lab Results   Component Value Date   CHOLHDL 3.4 10/29/2021   Lab Results  Component Value Date   HGBA1C 7.0 (H) 04/18/2022      Assessment & Plan:   Problem List Items Addressed This Visit       Cardiovascular and Mediastinum   Essential hypertension     Patient denies any chest pain or shortness of breath there is no history of palpitation or paroxysmal nocturnal dyspnea   patient was advised to follow low-salt low-cholesterol diet    ideally I want to keep systolic blood pressure below 130 mmHg, patient was asked to check blood pressure one times a week and give me a report on that.  Patient will be follow-up in 3 months  or earlier as needed, patient will call me back for any change in the cardiovascular symptoms Patient was advised to buy a book from local bookstore concerning blood pressure and read several chapters  every day.  This will be supplemented by some of the material we will give him from the office.  Patient should also utilize other resources like YouTube and Internet to learn more about the blood pressure and the diet.      Relevant Orders   CBC with Differential/Platelet   COMPLETE METABOLIC PANEL WITH GFR     Endocrine   Type 2 diabetes mellitus with hyperglycemia, without long-term current use of insulin ([place]) - Primary    - The patient's blood sugar is labile on med. - The patient will continue the current treatment regimen.  - I encouraged the patient to regularly check blood sugar.  - I encouraged the patient to monitor diet. I encouraged the patient to eat low-carb and low-sugar to help prevent blood sugar spikes.  - I encouraged the patient to continue following their prescribed treatment plan for diabetes - I informed the patient to get help if blood sugar drops below 74m/dL, or if suddenly have trouble thinking clearly or breathing.  Patient was advised to buy a book on diabetes from a local bookstore or from [place]  Patient should read 2 chapters every day to  keep the motivation going, this is in addition to some of the materials we provided them from the office.  There are other resources on the Internet like YouTube and wilkipedia to get an education on the diabetes        Other   Class 2 obesity due to excess calories without serious comorbidity with body mass index (BMI) of 39.0 to 39.9 in adult    - I encouraged the patient to lose weight.  - I educated them on making healthy dietary choices including eating more fruits and vegetables  and less fried foods. - I encouraged the patient to exercise more, and educated on the benefits of exercise including weight loss, diabetes prevention, and hypertension prevention.   Dietary counseling with a registered dietician  Referral to a weight management support group (e.g. Weight Watchers, Overeaters Anonymous)  If your BMI is greater than 29 or you have gained more than 15 pounds you should work on weight loss.  Attend a healthy cooking class       Dyslipidemia    Hypercholesterolemia  I advised the patient to follow Mediterranean diet This diet is rich in fruits vegetables and whole grain, and This diet is also rich in fish and lean meat Patient should also eat a handful of almonds or walnuts daily Recent heart study indicated that average follow-up on this kind of diet reduces the cardiovascular mortality by 50 to 70%==       No orders of the defined types were placed in this encounter.   Follow-up: No follow-ups on file.    Cletis Athens, MD

## 2022-06-17 NOTE — Assessment & Plan Note (Signed)
-   The patient's blood sugar is labile on med. - The patient will continue the current treatment regimen.  - I encouraged the patient to regularly check blood sugar.  - I encouraged the patient to monitor diet. I encouraged the patient to eat low-carb and low-sugar to help prevent blood sugar spikes.  - I encouraged the patient to continue following their prescribed treatment plan for diabetes - I informed the patient to get help if blood sugar drops below 50m/dL, or if suddenly have trouble thinking clearly or breathing.  Patient was advised to buy a book on diabetes from a local bookstore or from [place]  Patient should read 2 chapters every day to keep the motivation going, this is in addition to some of the materials we provided them from the office.  There are other resources on the Internet like YouTube and wilkipedia to get an education on the diabetes

## 2022-06-17 NOTE — Assessment & Plan Note (Signed)

## 2022-06-17 NOTE — Addendum Note (Signed)
Addended by: Alois Cliche on: 06/17/2022 12:38 PM   Modules accepted: Orders

## 2022-06-17 NOTE — Assessment & Plan Note (Signed)
Patient denies any chest pain or shortness of breath there is no history of palpitation or paroxysmal nocturnal dyspnea   patient was advised to follow low-salt low-cholesterol diet    ideally I want to keep systolic blood pressure below 130 mmHg, patient was asked to check blood pressure one times a week and give me a report on that.  Patient will be follow-up in 3 months  or earlier as needed, patient will call me back for any change in the cardiovascular symptoms Patient was advised to buy a book from local bookstore concerning blood pressure and read several chapters  every day.  This will be supplemented by some of the material we will give him from the office.  Patient should also utilize other resources like YouTube and Internet to learn more about the blood pressure and the diet.

## 2022-06-17 NOTE — Assessment & Plan Note (Signed)
Hypercholesterolemia  I advised the patient to follow Mediterranean diet This diet is rich in fruits vegetables and whole grain, and This diet is also rich in fish and lean meat Patient should also eat a handful of almonds or walnuts daily Recent heart study indicated that average follow-up on this kind of diet reduces the cardiovascular mortality by 50 to 70%==

## 2022-06-18 ENCOUNTER — Other Ambulatory Visit: Payer: Self-pay

## 2022-06-18 DIAGNOSIS — F3181 Bipolar II disorder: Secondary | ICD-10-CM | POA: Diagnosis not present

## 2022-06-18 DIAGNOSIS — F5081 Binge eating disorder: Secondary | ICD-10-CM | POA: Diagnosis not present

## 2022-06-18 DIAGNOSIS — G4700 Insomnia, unspecified: Secondary | ICD-10-CM | POA: Diagnosis not present

## 2022-06-18 DIAGNOSIS — F4310 Post-traumatic stress disorder, unspecified: Secondary | ICD-10-CM | POA: Diagnosis not present

## 2022-06-18 LAB — COMPLETE METABOLIC PANEL WITH GFR
AG Ratio: 1.1 (calc) (ref 1.0–2.5)
ALT: 20 U/L (ref 6–29)
AST: 26 U/L (ref 10–35)
Albumin: 4 g/dL (ref 3.6–5.1)
Alkaline phosphatase (APISO): 73 U/L (ref 37–153)
BUN: 13 mg/dL (ref 7–25)
CO2: 24 mmol/L (ref 20–32)
Calcium: 9.8 mg/dL (ref 8.6–10.4)
Chloride: 101 mmol/L (ref 98–110)
Creat: 0.81 mg/dL (ref 0.50–1.05)
Globulin: 3.5 g/dL (calc) (ref 1.9–3.7)
Glucose, Bld: 90 mg/dL (ref 65–99)
Potassium: 4.2 mmol/L (ref 3.5–5.3)
Sodium: 137 mmol/L (ref 135–146)
Total Bilirubin: 0.3 mg/dL (ref 0.2–1.2)
Total Protein: 7.5 g/dL (ref 6.1–8.1)
eGFR: 79 mL/min/{1.73_m2} (ref >=60)

## 2022-06-18 LAB — CBC WITH DIFFERENTIAL/PLATELET
Absolute Monocytes: 576 cells/uL (ref 200–950)
Basophils Absolute: 42 cells/uL (ref 0–200)
Basophils Relative: 0.7 %
Eosinophils Absolute: 102 cells/uL (ref 15–500)
Eosinophils Relative: 1.7 %
HCT: 31.4 % — ABNORMAL LOW (ref 35.0–45.0)
Hemoglobin: 9.3 g/dL — ABNORMAL LOW (ref 11.7–15.5)
Lymphs Abs: 1590 cells/uL (ref 850–3900)
MCH: 21 pg — ABNORMAL LOW (ref 27.0–33.0)
MCHC: 29.6 g/dL — ABNORMAL LOW (ref 32.0–36.0)
MCV: 70.9 fL — ABNORMAL LOW (ref 80.0–100.0)
MPV: 9.7 fL (ref 7.5–12.5)
Monocytes Relative: 9.6 %
Neutro Abs: 3690 cells/uL (ref 1500–7800)
Neutrophils Relative %: 61.5 %
Platelets: 333 10*3/uL (ref 140–400)
RBC: 4.43 10*6/uL (ref 3.80–5.10)
RDW: 17.2 % — ABNORMAL HIGH (ref 11.0–15.0)
Total Lymphocyte: 26.5 %
WBC: 6 10*3/uL (ref 3.8–10.8)

## 2022-06-18 MED ORDER — LAMOTRIGINE 25 MG PO TABS
ORAL_TABLET | ORAL | 0 refills | Status: DC
  Filled 2022-06-18: qty 60, 30d supply, fill #0

## 2022-06-18 MED ORDER — TRAZODONE HCL 100 MG PO TABS
100.0000 mg | ORAL_TABLET | Freq: Every evening | ORAL | 0 refills | Status: DC
  Filled 2022-06-18: qty 180, 90d supply, fill #0

## 2022-06-18 MED ORDER — BUSPIRONE HCL 10 MG PO TABS
10.0000 mg | ORAL_TABLET | Freq: Two times a day (BID) | ORAL | 0 refills | Status: DC
  Filled 2022-06-18: qty 60, 30d supply, fill #0

## 2022-06-19 ENCOUNTER — Other Ambulatory Visit: Payer: Self-pay

## 2022-06-24 ENCOUNTER — Other Ambulatory Visit: Payer: Self-pay

## 2022-06-24 MED ORDER — CLONAZEPAM 0.5 MG PO TABS
ORAL_TABLET | ORAL | 0 refills | Status: DC
  Filled 2022-06-24: qty 10, 10d supply, fill #0

## 2022-06-24 MED ORDER — QUETIAPINE FUMARATE 50 MG PO TABS
ORAL_TABLET | ORAL | 0 refills | Status: DC
  Filled 2022-06-24: qty 30, 30d supply, fill #0

## 2022-06-28 DIAGNOSIS — F410 Panic disorder [episodic paroxysmal anxiety] without agoraphobia: Secondary | ICD-10-CM | POA: Diagnosis not present

## 2022-06-28 DIAGNOSIS — F411 Generalized anxiety disorder: Secondary | ICD-10-CM | POA: Diagnosis not present

## 2022-06-28 DIAGNOSIS — Z79899 Other long term (current) drug therapy: Secondary | ICD-10-CM | POA: Diagnosis not present

## 2022-06-28 DIAGNOSIS — F332 Major depressive disorder, recurrent severe without psychotic features: Secondary | ICD-10-CM | POA: Diagnosis not present

## 2022-07-01 ENCOUNTER — Other Ambulatory Visit: Payer: Self-pay

## 2022-07-02 ENCOUNTER — Other Ambulatory Visit: Payer: Self-pay

## 2022-07-02 MED ORDER — CELECOXIB 200 MG PO CAPS
ORAL_CAPSULE | ORAL | 3 refills | Status: DC
  Filled 2022-07-02: qty 60, 30d supply, fill #0
  Filled 2022-09-19: qty 60, 30d supply, fill #1
  Filled 2022-11-20: qty 60, 30d supply, fill #2

## 2022-07-05 ENCOUNTER — Other Ambulatory Visit: Payer: Self-pay

## 2022-07-09 ENCOUNTER — Other Ambulatory Visit: Payer: Self-pay

## 2022-07-09 DIAGNOSIS — F4310 Post-traumatic stress disorder, unspecified: Secondary | ICD-10-CM | POA: Diagnosis not present

## 2022-07-09 DIAGNOSIS — G4700 Insomnia, unspecified: Secondary | ICD-10-CM | POA: Diagnosis not present

## 2022-07-09 DIAGNOSIS — F3181 Bipolar II disorder: Secondary | ICD-10-CM | POA: Diagnosis not present

## 2022-07-09 DIAGNOSIS — F5081 Binge eating disorder: Secondary | ICD-10-CM | POA: Diagnosis not present

## 2022-07-09 MED ORDER — LAMOTRIGINE 100 MG PO TABS
100.0000 mg | ORAL_TABLET | Freq: Every day | ORAL | 0 refills | Status: DC
Start: 2022-07-09 — End: 2022-08-08
  Filled 2022-07-09: qty 14, 14d supply, fill #0

## 2022-07-09 MED ORDER — LAMOTRIGINE 150 MG PO TABS
150.0000 mg | ORAL_TABLET | Freq: Every day | ORAL | 0 refills | Status: DC
  Filled 2022-07-30: qty 30, 30d supply, fill #0

## 2022-07-09 MED ORDER — SERTRALINE HCL 100 MG PO TABS
200.0000 mg | ORAL_TABLET | Freq: Every day | ORAL | 0 refills | Status: DC
  Filled 2022-07-09 – 2022-07-31 (×3): qty 180, 90d supply, fill #0

## 2022-07-09 MED ORDER — TRAZODONE HCL 100 MG PO TABS
ORAL_TABLET | ORAL | 0 refills | Status: DC
  Filled 2022-07-09: qty 180, 90d supply, fill #0

## 2022-07-09 MED ORDER — QUETIAPINE FUMARATE 50 MG PO TABS
50.0000 mg | ORAL_TABLET | Freq: Every day | ORAL | 0 refills | Status: DC
  Filled 2022-07-09 – 2022-07-30 (×2): qty 90, 90d supply, fill #0

## 2022-07-10 DIAGNOSIS — F332 Major depressive disorder, recurrent severe without psychotic features: Secondary | ICD-10-CM | POA: Diagnosis not present

## 2022-07-17 ENCOUNTER — Other Ambulatory Visit: Payer: Self-pay

## 2022-07-22 ENCOUNTER — Other Ambulatory Visit (HOSPITAL_COMMUNITY): Payer: Self-pay

## 2022-07-24 ENCOUNTER — Other Ambulatory Visit (HOSPITAL_COMMUNITY): Payer: Self-pay

## 2022-07-26 ENCOUNTER — Other Ambulatory Visit: Payer: Self-pay | Admitting: Internal Medicine

## 2022-07-26 DIAGNOSIS — Z1231 Encounter for screening mammogram for malignant neoplasm of breast: Secondary | ICD-10-CM

## 2022-07-30 ENCOUNTER — Other Ambulatory Visit: Payer: Self-pay

## 2022-07-30 ENCOUNTER — Other Ambulatory Visit: Payer: Self-pay | Admitting: Internal Medicine

## 2022-07-30 DIAGNOSIS — F332 Major depressive disorder, recurrent severe without psychotic features: Secondary | ICD-10-CM | POA: Diagnosis not present

## 2022-07-31 ENCOUNTER — Other Ambulatory Visit: Payer: Self-pay

## 2022-07-31 DIAGNOSIS — F5081 Binge eating disorder: Secondary | ICD-10-CM | POA: Diagnosis not present

## 2022-07-31 DIAGNOSIS — F3181 Bipolar II disorder: Secondary | ICD-10-CM | POA: Diagnosis not present

## 2022-07-31 DIAGNOSIS — F4310 Post-traumatic stress disorder, unspecified: Secondary | ICD-10-CM | POA: Diagnosis not present

## 2022-07-31 MED ORDER — LOSARTAN POTASSIUM 50 MG PO TABS
ORAL_TABLET | ORAL | 3 refills | Status: DC
  Filled 2022-07-31: qty 90, fill #0
  Filled 2022-10-02: qty 90, 90d supply, fill #0
  Filled 2022-12-12: qty 90, 90d supply, fill #1
  Filled 2023-04-18: qty 90, 90d supply, fill #2
  Filled 2023-07-01: qty 90, 90d supply, fill #3

## 2022-07-31 MED ORDER — METFORMIN HCL ER 500 MG PO TB24
ORAL_TABLET | ORAL | 6 refills | Status: DC
  Filled 2022-07-31: qty 60, 30d supply, fill #0
  Filled 2022-08-29: qty 60, 30d supply, fill #1
  Filled 2022-10-02: qty 60, 30d supply, fill #2
  Filled 2022-10-30: qty 60, 30d supply, fill #3
  Filled 2022-12-12: qty 60, 30d supply, fill #4
  Filled 2023-04-18: qty 60, 30d supply, fill #5
  Filled 2023-05-13: qty 60, 30d supply, fill #6

## 2022-08-05 DIAGNOSIS — F332 Major depressive disorder, recurrent severe without psychotic features: Secondary | ICD-10-CM | POA: Diagnosis not present

## 2022-08-06 ENCOUNTER — Other Ambulatory Visit: Payer: Self-pay

## 2022-08-06 MED ORDER — RSVPREF3 VAC RECOMB ADJUVANTED 120 MCG/0.5ML IM SUSR
INTRAMUSCULAR | 0 refills | Status: DC
  Filled 2022-08-06: qty 0.5, 1d supply, fill #0

## 2022-08-07 ENCOUNTER — Other Ambulatory Visit: Payer: Self-pay

## 2022-08-08 ENCOUNTER — Other Ambulatory Visit: Payer: Self-pay

## 2022-08-08 DIAGNOSIS — F4310 Post-traumatic stress disorder, unspecified: Secondary | ICD-10-CM | POA: Diagnosis not present

## 2022-08-08 DIAGNOSIS — G4700 Insomnia, unspecified: Secondary | ICD-10-CM | POA: Diagnosis not present

## 2022-08-08 DIAGNOSIS — F5081 Binge eating disorder: Secondary | ICD-10-CM | POA: Diagnosis not present

## 2022-08-08 DIAGNOSIS — F3181 Bipolar II disorder: Secondary | ICD-10-CM | POA: Diagnosis not present

## 2022-08-08 MED ORDER — LAMOTRIGINE 100 MG PO TABS
100.0000 mg | ORAL_TABLET | Freq: Every day | ORAL | 0 refills | Status: DC
  Filled 2022-08-08: qty 90, 90d supply, fill #0

## 2022-08-09 ENCOUNTER — Other Ambulatory Visit: Payer: Self-pay

## 2022-08-09 DIAGNOSIS — F332 Major depressive disorder, recurrent severe without psychotic features: Secondary | ICD-10-CM | POA: Diagnosis not present

## 2022-08-12 ENCOUNTER — Other Ambulatory Visit: Payer: Self-pay

## 2022-08-12 DIAGNOSIS — F332 Major depressive disorder, recurrent severe without psychotic features: Secondary | ICD-10-CM | POA: Diagnosis not present

## 2022-08-13 DIAGNOSIS — R32 Unspecified urinary incontinence: Secondary | ICD-10-CM | POA: Diagnosis not present

## 2022-08-13 DIAGNOSIS — G8929 Other chronic pain: Secondary | ICD-10-CM | POA: Diagnosis not present

## 2022-08-13 DIAGNOSIS — I10 Essential (primary) hypertension: Secondary | ICD-10-CM | POA: Diagnosis not present

## 2022-08-13 DIAGNOSIS — K219 Gastro-esophageal reflux disease without esophagitis: Secondary | ICD-10-CM | POA: Diagnosis not present

## 2022-08-13 DIAGNOSIS — F3342 Major depressive disorder, recurrent, in full remission: Secondary | ICD-10-CM | POA: Diagnosis not present

## 2022-08-13 DIAGNOSIS — F419 Anxiety disorder, unspecified: Secondary | ICD-10-CM | POA: Diagnosis not present

## 2022-08-13 DIAGNOSIS — E1165 Type 2 diabetes mellitus with hyperglycemia: Secondary | ICD-10-CM | POA: Diagnosis not present

## 2022-08-13 DIAGNOSIS — M1990 Unspecified osteoarthritis, unspecified site: Secondary | ICD-10-CM | POA: Diagnosis not present

## 2022-08-13 DIAGNOSIS — G4700 Insomnia, unspecified: Secondary | ICD-10-CM | POA: Diagnosis not present

## 2022-08-27 DIAGNOSIS — F332 Major depressive disorder, recurrent severe without psychotic features: Secondary | ICD-10-CM | POA: Diagnosis not present

## 2022-08-29 ENCOUNTER — Other Ambulatory Visit: Payer: Self-pay

## 2022-08-29 ENCOUNTER — Other Ambulatory Visit: Payer: Self-pay | Admitting: Internal Medicine

## 2022-08-29 DIAGNOSIS — E119 Type 2 diabetes mellitus without complications: Secondary | ICD-10-CM

## 2022-08-30 DIAGNOSIS — F332 Major depressive disorder, recurrent severe without psychotic features: Secondary | ICD-10-CM | POA: Diagnosis not present

## 2022-09-01 ENCOUNTER — Other Ambulatory Visit: Payer: Self-pay

## 2022-09-01 ENCOUNTER — Other Ambulatory Visit: Payer: Self-pay | Admitting: Internal Medicine

## 2022-09-01 DIAGNOSIS — E119 Type 2 diabetes mellitus without complications: Secondary | ICD-10-CM

## 2022-09-02 ENCOUNTER — Other Ambulatory Visit: Payer: Self-pay

## 2022-09-02 MED FILL — Semaglutide Soln Pen-inj 1 MG/DOSE (4 MG/3ML): SUBCUTANEOUS | 28 days supply | Qty: 3 | Fill #0 | Status: CN

## 2022-09-03 ENCOUNTER — Ambulatory Visit
Admission: RE | Admit: 2022-09-03 | Discharge: 2022-09-03 | Disposition: A | Discharge status: Home / Self Care | Payer: 59 | Source: Ambulatory Visit | Attending: Internal Medicine | Admitting: Internal Medicine

## 2022-09-03 DIAGNOSIS — F332 Major depressive disorder, recurrent severe without psychotic features: Secondary | ICD-10-CM | POA: Diagnosis not present

## 2022-09-03 DIAGNOSIS — Z1231 Encounter for screening mammogram for malignant neoplasm of breast: Secondary | ICD-10-CM | POA: Diagnosis not present

## 2022-09-06 DIAGNOSIS — F332 Major depressive disorder, recurrent severe without psychotic features: Secondary | ICD-10-CM | POA: Diagnosis not present

## 2022-09-09 DIAGNOSIS — F332 Major depressive disorder, recurrent severe without psychotic features: Secondary | ICD-10-CM | POA: Diagnosis not present

## 2022-09-10 ENCOUNTER — Other Ambulatory Visit: Payer: Self-pay

## 2022-09-10 MED FILL — Semaglutide Soln Pen-inj 1 MG/DOSE (4 MG/3ML): SUBCUTANEOUS | 28 days supply | Qty: 3 | Fill #0 | Status: AC

## 2022-09-11 ENCOUNTER — Other Ambulatory Visit: Payer: Self-pay

## 2022-09-11 ENCOUNTER — Ambulatory Visit (INDEPENDENT_AMBULATORY_CARE_PROVIDER_SITE_OTHER): Payer: 59 | Admitting: Internal Medicine

## 2022-09-11 ENCOUNTER — Encounter: Payer: Self-pay | Admitting: Internal Medicine

## 2022-09-11 VITALS — BP 120/64 | HR 85 | Ht 60.0 in | Wt 186.7 lb

## 2022-09-11 DIAGNOSIS — I10 Essential (primary) hypertension: Secondary | ICD-10-CM | POA: Diagnosis not present

## 2022-09-11 DIAGNOSIS — E785 Hyperlipidemia, unspecified: Secondary | ICD-10-CM

## 2022-09-11 DIAGNOSIS — R300 Dysuria: Secondary | ICD-10-CM | POA: Diagnosis not present

## 2022-09-11 DIAGNOSIS — M1990 Unspecified osteoarthritis, unspecified site: Secondary | ICD-10-CM

## 2022-09-11 DIAGNOSIS — E1165 Type 2 diabetes mellitus with hyperglycemia: Secondary | ICD-10-CM

## 2022-09-11 DIAGNOSIS — M546 Pain in thoracic spine: Secondary | ICD-10-CM

## 2022-09-11 DIAGNOSIS — K7460 Unspecified cirrhosis of liver: Secondary | ICD-10-CM

## 2022-09-11 DIAGNOSIS — Z1329 Encounter for screening for other suspected endocrine disorder: Secondary | ICD-10-CM

## 2022-09-11 DIAGNOSIS — G8929 Other chronic pain: Secondary | ICD-10-CM

## 2022-09-11 LAB — POCT URINALYSIS DIPSTICK
Appearance: NORMAL
Bilirubin, UA: NEGATIVE
Blood, UA: NEGATIVE
Glucose, UA: NEGATIVE
Ketones, UA: NEGATIVE
Leukocytes, UA: NEGATIVE
Nitrite, UA: NEGATIVE
Protein, UA: NEGATIVE
Spec Grav, UA: 1.015 (ref 1.010–1.025)
Urobilinogen, UA: 0.2 E.U./dL
pH, UA: 6.5 (ref 5.0–8.0)

## 2022-09-11 MED ORDER — ESTRADIOL 1 MG PO TABS
ORAL_TABLET | Freq: Every day | ORAL | 1 refills | Status: DC
  Filled 2022-09-11: qty 90, fill #0
  Filled 2022-10-02: qty 90, 90d supply, fill #0
  Filled 2022-12-31: qty 90, 90d supply, fill #1

## 2022-09-11 NOTE — Assessment & Plan Note (Signed)
Patient denies any chest pain or shortness of breath there is no history of palpitation or paroxysmal nocturnal dyspnea   patient was advised to follow low-salt low-cholesterol diet    ideally I want to keep systolic blood pressure below 130 mmHg, patient was asked to check blood pressure one times a week and give me a report on that.  Patient will be follow-up in 3 months  or earlier as needed, patient will call me back for any change in the cardiovascular symptoms Patient was advised to buy a book from local bookstore concerning blood pressure and read several chapters  every day.  This will be supplemented by some of the material we will give him from the office.  Patient should also utilize other resources like YouTube and Internet to learn more about the blood pressure and the diet.

## 2022-09-11 NOTE — Assessment & Plan Note (Signed)
-   The patient's blood sugar is labile on med. - The patient will continue the current treatment regimen.  - I encouraged the patient to regularly check blood sugar.  - I encouraged the patient to monitor diet. I encouraged the patient to eat low-carb and low-sugar to help prevent blood sugar spikes.  - I encouraged the patient to continue following their prescribed treatment plan for diabetes - I informed the patient to get help if blood sugar drops below 86m/dL, or if suddenly have trouble thinking clearly or breathing.  Patient was advised to buy a book on diabetes from a local bookstore or from [place]  Patient should read 2 chapters every day to keep the motivation going, this is in addition to some of the materials we provided them from the office.  There are other resources on the Internet like YouTube and wilkipedia to get an education on the diabetes

## 2022-09-11 NOTE — Progress Notes (Signed)
Established Patient Office Visit  Subjective:  Patient ID: April Singleton, female    DOB: 1951-05-12  Age: 71 y.o. MRN: [id number]  CC:  Chief Complaint  Patient presents with   Diabetes    3 month fu, thinks she has UTI, wants labs done.     Diabetes    April Singleton presents for lower back pain  Past Medical History:  Diagnosis Date   Anemia    Arthritis    Asthma    Chronic kidney disease    Depression    Diabetes mellitus without complication (HCC)    GERD (gastroesophageal reflux disease)    History of kidney stones    Hypertension    Pneumonia    PONV (postoperative nausea and vomiting)     Past Surgical History:  Procedure Laterality Date   ABDOMINAL HYSTERECTOMY     BREAST BIOPSY     Pt unsure of type of biopsy or even of which side   KNEE ARTHROPLASTY Left 04/29/2022   Procedure: COMPUTER ASSISTED TOTAL KNEE ARTHROPLASTY;  Surgeon: Dereck Leep, MD;  Location: [facility] ORS;  Service: Orthopedics;  Laterality: Left;   REDUCTION MAMMAPLASTY     TONSILLECTOMY      Family History  Problem Relation Age of Onset   Liver cancer Father    Lung cancer Father    Breast cancer Maternal Aunt     Social History   Socioeconomic History   Marital status: Married    Spouse name: Danny   Number of children: 3   Years of education: Not on file   Highest education level: Not on file  Occupational History   Not on file  Tobacco Use   Smoking status: Never   Smokeless tobacco: Never  Vaping Use   Vaping Use: Never used  Substance and Sexual Activity   Alcohol use: Yes    Comment: rarely   Drug use: Never   Sexual activity: Yes    Birth control/protection: Surgical    Comment: Hysterectomy  Other Topics Concern   Not on file  Social History Narrative   3 grandchildren and daughter in the home   Social Determinants of Health   Financial Resource Strain: Low Risk  (10/12/2021)   Overall Financial Resource Strain (CARDIA)    Difficulty of Paying  Living Expenses: Not hard at all  Food Insecurity: No Food Insecurity (10/12/2021)   Hunger Vital Sign    Worried About Running Out of Food in the Last Year: Never true    Ran Out of Food in the Last Year: Never true  Transportation Needs: No Transportation Needs (10/12/2021)   PRAPARE - Hydrologist (Medical): No    Lack of Transportation (Non-Medical): No  Physical Activity: Sufficiently Active (10/12/2021)   Exercise Vital Sign    Days of Exercise per Week: 4 days    Minutes of Exercise per Session: 40 min  Stress: No Stress Concern Present (10/12/2021)   [place]    Feeling of Stress : Not at all  Social Connections: [place] (10/12/2021)   Social Connection and Isolation Panel [NHANES]    Frequency of Communication with Friends and Family: More than three times a week    Frequency of Social Gatherings with Friends and Family: More than three times a week    Attends Religious Services: More than 4 times per year    Active Member of [employer] or Organizations: Yes  Attends Archivist Meetings: More than 4 times per year    Marital Status: Married  Human resources officer Violence: Not At Risk (10/12/2021)   Humiliation, Afraid, Rape, and Kick questionnaire    Fear of Current or Ex-Partner: No    Emotionally Abused: No    Physically Abused: No    Sexually Abused: No     Current Outpatient Medications:    acetaminophen (TYLENOL) 500 MG tablet, Take 1,000 mg by mouth every 6 (six) hours as needed., Disp: , Rfl:    Asenapine Maleate (SAPHRIS) 2.5 MG SUBL, Take 1 at bedtime, Disp: 90 tablet, Rfl: 0   busPIRone (BUSPAR) 10 MG tablet, Take 1 tablet (10 mg total) by mouth 2 (two) times daily., Disp: 60 tablet, Rfl: 0   celecoxib (CELEBREX) 200 MG capsule, Take 1 capsule (200 mg total) by mouth 2 (two) times daily, Disp: 60 capsule, Rfl: 3   clonazePAM (KLONOPIN) 0.5 MG tablet, Take 1  daily as needed, Disp: 10 tablet, Rfl: 0   clotrimazole-betamethasone (LOTRISONE) cream, Apply 1 application topically daily., Disp: 45 g, Rfl: 4   cyclobenzaprine (FLEXERIL) 10 MG tablet, Take 1 tablet (10 mg total) by mouth 3 (three) times daily as needed for Muscle spasms for up to 10 days For muscle spasm, Disp: 30 tablet, Rfl: 0   estradiol (ESTRACE) 1 MG tablet, TAKE 1 TABLET (1 MG TOTAL) BY MOUTH DAILY., Disp: 30 tablet, Rfl: 6   hydrochlorothiazide (HYDRODIURIL) 12.5 MG tablet, TAKE 1 TABLET BY MOUTH ONCE DAILY WITH LOSARTAN, Disp: 90 tablet, Rfl: 3   ipratropium (ATROVENT) 0.06 % nasal spray, Place 2 sprays into both nostrils 3 (three) times daily as needed for Rhinitis, Disp: 15 mL, Rfl: 0   lamoTRIgine (LAMICTAL) 100 MG tablet, Take 1 tablet (100 mg total) by mouth daily., Disp: 90 tablet, Rfl: 0   lamoTRIgine (LAMICTAL) 150 MG tablet, Take 1 tablet (150 mg total) by mouth daily beginning on 07/31/22., Disp: 30 tablet, Rfl: 0   lisdexamfetamine (VYVANSE) 20 MG capsule, Take 1 daily, Disp: 90 capsule, Rfl: 0   losartan (COZAAR) 50 MG tablet, TAKE 1 TABLET BY MOUTH DAILY WITH HCTZ, Disp: 90 tablet, Rfl: 3   losartan-hydrochlorothiazide (HYZAAR) 50-12.5 MG tablet, TAKE 1 TABLET BY MOUTH DAILY, Disp: 90 tablet, Rfl: 3   metFORMIN (GLUCOPHAGE-XR) 500 MG 24 hr tablet, TAKE 1 TABLET BY MOUTH TWICE DAILY, Disp: 60 tablet, Rfl: 6   OLANZapine (ZYPREXA) 5 MG tablet, Take 1 at bedtime, Disp: 90 tablet, Rfl: 0   ondansetron (ZOFRAN-ODT) 4 MG disintegrating tablet, Take 1 tablet (4 mg total) by mouth every 8 (eight) hours as needed for nausea or vomiting., Disp: 20 tablet, Rfl: 0   oxyCODONE (OXY IR/ROXICODONE) 5 MG immediate release tablet, Take 1 tablet (5 mg total) by mouth every 4 (four) hours as needed for moderate pain (pain score 4-6)., Disp: 30 tablet, Rfl: 0   pantoprazole (PROTONIX) 40 MG tablet, Take 1 tablet (40 mg total) by mouth as directed. Take 30-45 minutes before breakfast., Disp: 30  tablet, Rfl: 6   QUEtiapine (SEROQUEL) 50 MG tablet, Take 1 tablet (50 mg total) by mouth at bedtime., Disp: 90 tablet, Rfl: 0   RSV vaccine recomb adjuvanted (AREXVY) 120 MCG/0.5ML injection, Inject into the muscle., Disp: 0.5 mL, Rfl: 0   Semaglutide, 1 MG/DOSE, (OZEMPIC, 1 MG/DOSE,) 4 MG/3ML SOPN, Inject 1 mg as directed once a week., Disp: 3 mL, Rfl: 1   sertraline (ZOLOFT) 100 MG tablet, Take 1 and 1/2 tablet  po daily with breakfast, Disp: 135 tablet, Rfl: 1   sertraline (ZOLOFT) 100 MG tablet, Take 2 tablets (200 mg total) by mouth daily with breakfast., Disp: 180 tablet, Rfl: 0   tiZANidine (ZANAFLEX) 2 MG tablet, Take 1 tablet (2 mg total) by mouth every 8 (eight) hours as needed for Muscle spasms, Disp: 30 tablet, Rfl: 0   traMADol (ULTRAM) 50 MG tablet, Take 1 tablet (50 mg total) by mouth every 4 (four) hours as needed for moderate pain., Disp: 30 tablet, Rfl: 0   traZODone (DESYREL) 100 MG tablet, Take 1 to 2 tablets by mouth nightly as needed for sleep, Disp: 180 tablet, Rfl: 0   traZODone (DESYREL) 50 MG tablet, Take 1 at bedtime as needed, Disp: 30 tablet, Rfl: 0   verapamil (VERELAN PM) 240 MG 24 hr capsule, Take 1 capsule (240 mg total) by mouth daily., Disp: 90 capsule, Rfl: 3   zolpidem (AMBIEN) 5 MG tablet, Take 1 tablet (5 mg total) by mouth at bedtime as needed for sleep., Disp: 30 tablet, Rfl: 0   enoxaparin (LOVENOX) 40 MG/0.4ML injection, Inject 0.4 mLs (40 mg total) into the skin daily for 14 days., Disp: 5.6 mL, Rfl: 0   lamoTRIgine (LAMICTAL) 25 MG tablet, Take 1 tablet (25 mg total) by mouth daily for 14 days, THEN 2 tablets (50 mg total) daily with breakfast, Disp: 60 tablet, Rfl: 0   Allergies  Allergen Reactions   Codeine Other (See Comments)    Nausea and vomiting   Penicillins     IgE = 95 (WNL) on 04/18/2022   Prednisone Other (See Comments)    Makes her violent , patient reports only in high doses   Sulfa Antibiotics Hives    ROS Review of Systems   Constitutional: Negative.   HENT: Negative.    Eyes: Negative.   Respiratory: Negative.    Cardiovascular: Negative.   Gastrointestinal: Negative.   Endocrine: Negative.   Genitourinary: Negative.   Musculoskeletal: Negative.   Skin: Negative.   Allergic/Immunologic: Negative.   Neurological: Negative.   Hematological: Negative.   Psychiatric/Behavioral: Negative.    All other systems reviewed and are negative.     Objective:    Physical Exam Vitals reviewed.  Constitutional:      Appearance: Normal appearance.  HENT:     Mouth/Throat:     Mouth: Mucous membranes are moist.  Eyes:     Pupils: Pupils are equal, round, and reactive to light.  Neck:     Vascular: No carotid bruit.  Cardiovascular:     Rate and Rhythm: Normal rate and regular rhythm.     Pulses: Normal pulses.     Heart sounds: Normal heart sounds.  Pulmonary:     Effort: Pulmonary effort is normal.     Breath sounds: Normal breath sounds.  Abdominal:     General: Bowel sounds are normal.     Palpations: Abdomen is soft. There is no hepatomegaly, splenomegaly or mass.     Tenderness: There is no abdominal tenderness.     Hernia: No hernia is present.  Musculoskeletal:        General: No tenderness.     Cervical back: Neck supple.     Right lower leg: No edema.     Left lower leg: No edema.  Skin:    Findings: No rash.  Neurological:     Mental Status: She is alert and oriented to person, place, and time.     Motor: No weakness.  Psychiatric:        Mood and Affect: Mood and affect normal.        Behavior: Behavior normal.     BP 120/64   Pulse 85   Ht 5' (1.524 m)   Wt 186 lb 11.2 oz (84.7 kg)   SpO2 96%   BMI 36.46 kg/m  Wt Readings from Last 3 Encounters:  09/11/22 186 lb 11.2 oz (84.7 kg)  06/17/22 187 lb 8 oz (85 kg)  04/29/22 194 lb (88 kg)     Health Maintenance Due  Topic Date Due   FOOT EXAM  Never done   DEXA SCAN  Never done   Diabetic kidney evaluation - Urine ACR   03/17/2019   COLONOSCOPY (Pts 45-84yr Insurance coverage will need to be confirmed)  09/11/2022   Medicare Annual Wellness (AWV)  10/12/2022    There are no preventive care reminders to display for this patient.  Lab Results  Component Value Date   TSH 1.94 10/29/2021   Lab Results  Component Value Date   WBC 6.0 06/17/2022   HGB 9.3 (L) 06/17/2022   HCT 31.4 (L) 06/17/2022   MCV 70.9 (L) 06/17/2022   PLT 333 06/17/2022   Lab Results  Component Value Date   NA 137 06/17/2022   K 4.2 06/17/2022   CO2 24 06/17/2022   GLUCOSE 90 06/17/2022   BUN 13 06/17/2022   CREATININE 0.81 06/17/2022   BILITOT 0.3 06/17/2022   ALKPHOS 75 04/18/2022   AST 26 06/17/2022   ALT 20 06/17/2022   PROT 7.5 06/17/2022   ALBUMIN 3.6 04/18/2022   CALCIUM 9.8 06/17/2022   ANIONGAP 8 04/18/2022   EGFR 79 06/17/2022   Lab Results  Component Value Date   CHOL 265 (H) 10/29/2021   Lab Results  Component Value Date   HDL 77 10/29/2021   Lab Results  Component Value Date   LDLCALC 155 (H) 10/29/2021   Lab Results  Component Value Date   TRIG 189 (H) 10/29/2021   Lab Results  Component Value Date   CHOLHDL 3.4 10/29/2021   Lab Results  Component Value Date   HGBA1C 7.0 (H) 04/18/2022      Assessment & Plan:   Problem List Items Addressed This Visit       Cardiovascular and Mediastinum   Essential hypertension     Patient denies any chest pain or shortness of breath there is no history of palpitation or paroxysmal nocturnal dyspnea   patient was advised to follow low-salt low-cholesterol diet    ideally I want to keep systolic blood pressure below 130 mmHg, patient was asked to check blood pressure one times a week and give me a report on that.  Patient will be follow-up in 3 months  or earlier as needed, patient will call me back for any change in the cardiovascular symptoms Patient was advised to buy a book from local bookstore concerning blood pressure and read several  chapters  every day.  This will be supplemented by some of the material we will give him from the office.  Patient should also utilize other resources like YouTube and Internet to learn more about the blood pressure and the diet.        Digestive   Cir[place] of [place] without ascites [place]     Endocrine   Type 2 diabetes mellitus with hyperglycemia, without long-term current use of insulin ([place]    - The patient's blood sugar is labile on med. -  The patient will continue the current treatment regimen.  - I encouraged the patient to regularly check blood sugar.  - I encouraged the patient to monitor diet. I encouraged the patient to eat low-carb and low-sugar to help prevent blood sugar spikes.  - I encouraged the patient to continue following their prescribed treatment plan for diabetes - I informed the patient to get help if blood sugar drops below 37m/dL, or if suddenly have trouble thinking clearly or breathing.  Patient was advised to buy a book on diabetes from a local bookstore or from [place]  Patient should read 2 chapters every day to keep the motivation going, this is in addition to some of the materials we provided them from the office.  There are other resources on the Internet like YouTube and [employer] to get an education on the diabetes        Musculoskeletal and Integument   Arthritis    She complains of lower back pain.  Urine examination is normal.  Will do a complete blood test.        Other   Back pain    - Patient's back pain is under control with medication.  - Encouraged the patient to stretch or do yoga as able to help with back pain      Other Visit Diagnoses     Dysuria    -  Primary   Relevant Orders   POCT urinalysis dipstick (Completed)       No orders of the defined types were placed in this encounter.   Follow-up: No follow-ups on file.    JCletis Athens MD

## 2022-09-11 NOTE — Addendum Note (Signed)
Addended by: Angus Seller on: 09/11/2022 09:35 AM   Modules accepted: Orders

## 2022-09-11 NOTE — Assessment & Plan Note (Signed)
-   Patient's back pain is under control with medication.  - Encouraged the patient to stretch or do yoga as able to help with back pain 

## 2022-09-11 NOTE — Assessment & Plan Note (Signed)
She complains of lower back pain.  Urine examination is normal.  Will do a complete blood test.

## 2022-09-11 NOTE — Addendum Note (Signed)
Addended by: Angus Seller on: 09/11/2022 09:15 AM   Modules accepted: Orders

## 2022-09-12 LAB — COMPLETE METABOLIC PANEL WITH GFR
AG Ratio: 1.3 (calc) (ref 1.0–2.5)
ALT: 14 U/L (ref 6–29)
AST: 24 U/L (ref 10–35)
Albumin: 4 g/dL (ref 3.6–5.1)
Alkaline phosphatase (APISO): 79 U/L (ref 37–153)
BUN: 12 mg/dL (ref 7–25)
CO2: 26 mmol/L (ref 20–32)
Calcium: 9.4 mg/dL (ref 8.6–10.4)
Chloride: 102 mmol/L (ref 98–110)
Creat: 0.83 mg/dL (ref 0.60–1.00)
Globulin: 3.2 g/dL (calc) (ref 1.9–3.7)
Glucose, Bld: 87 mg/dL (ref 65–99)
Potassium: 4.1 mmol/L (ref 3.5–5.3)
Sodium: 137 mmol/L (ref 135–146)
Total Bilirubin: 0.5 mg/dL (ref 0.2–1.2)
Total Protein: 7.2 g/dL (ref 6.1–8.1)
eGFR: 76 mL/min/{1.73_m2} (ref >=60)

## 2022-09-12 LAB — CBC WITH DIFFERENTIAL/PLATELET
Absolute Monocytes: 516 cells/uL (ref 200–950)
Basophils Absolute: 20 cells/uL (ref 0–200)
Basophils Relative: 0.5 %
Eosinophils Absolute: 140 cells/uL (ref 15–500)
Eosinophils Relative: 3.5 %
HCT: 28.9 % — ABNORMAL LOW (ref 35.0–45.0)
Hemoglobin: 8.4 g/dL — ABNORMAL LOW (ref 11.7–15.5)
Lymphs Abs: 624 cells/uL — ABNORMAL LOW (ref 850–3900)
MCH: 18.2 pg — ABNORMAL LOW (ref 27.0–33.0)
MCHC: 29.1 g/dL — ABNORMAL LOW (ref 32.0–36.0)
MCV: 62.7 fL — ABNORMAL LOW (ref 80.0–100.0)
MPV: 10.1 fL (ref 7.5–12.5)
Monocytes Relative: 12.9 %
Neutro Abs: 2700 cells/uL (ref 1500–7800)
Neutrophils Relative %: 67.5 %
Platelets: 308 10*3/uL (ref 140–400)
RBC: 4.61 10*6/uL (ref 3.80–5.10)
RDW: 19.4 % — ABNORMAL HIGH (ref 11.0–15.0)
Total Lymphocyte: 15.6 %
WBC: 4 10*3/uL (ref 3.8–10.8)

## 2022-09-12 LAB — HEMOGLOBIN A1C
Hgb A1c MFr Bld: 6.1 % of total Hgb — ABNORMAL HIGH (ref <5.7)
Mean Plasma Glucose: 128 mg/dL
eAG (mmol/L): 7.1 mmol/L

## 2022-09-12 LAB — LIPID PANEL
Cholesterol: 231 mg/dL — ABNORMAL HIGH (ref <200)
HDL: 72 mg/dL (ref >=50)
LDL Cholesterol (Calc): 134 mg/dL (calc) — ABNORMAL HIGH
Non-HDL Cholesterol (Calc): 159 mg/dL (calc) — ABNORMAL HIGH (ref <130)
Total CHOL/HDL Ratio: 3.2 (calc) (ref <5.0)
Triglycerides: 135 mg/dL (ref <150)

## 2022-09-12 LAB — TSH: TSH: 3.05 mIU/L (ref 0.40–4.50)

## 2022-09-12 LAB — CBC MORPHOLOGY

## 2022-09-13 DIAGNOSIS — F3181 Bipolar II disorder: Secondary | ICD-10-CM | POA: Diagnosis not present

## 2022-09-13 DIAGNOSIS — F4310 Post-traumatic stress disorder, unspecified: Secondary | ICD-10-CM | POA: Diagnosis not present

## 2022-09-13 DIAGNOSIS — F5081 Binge eating disorder: Secondary | ICD-10-CM | POA: Diagnosis not present

## 2022-09-17 DIAGNOSIS — F332 Major depressive disorder, recurrent severe without psychotic features: Secondary | ICD-10-CM | POA: Diagnosis not present

## 2022-09-19 ENCOUNTER — Other Ambulatory Visit: Payer: Self-pay

## 2022-09-24 DIAGNOSIS — F332 Major depressive disorder, recurrent severe without psychotic features: Secondary | ICD-10-CM | POA: Diagnosis not present

## 2022-10-01 DIAGNOSIS — F332 Major depressive disorder, recurrent severe without psychotic features: Secondary | ICD-10-CM | POA: Diagnosis not present

## 2022-10-02 ENCOUNTER — Other Ambulatory Visit: Payer: Self-pay

## 2022-10-02 MED FILL — Semaglutide Soln Pen-inj 1 MG/DOSE (4 MG/3ML): SUBCUTANEOUS | 28 days supply | Qty: 3 | Fill #1 | Status: AC

## 2022-10-08 DIAGNOSIS — F332 Major depressive disorder, recurrent severe without psychotic features: Secondary | ICD-10-CM | POA: Diagnosis not present

## 2022-10-10 ENCOUNTER — Other Ambulatory Visit: Payer: Self-pay

## 2022-10-10 DIAGNOSIS — G4700 Insomnia, unspecified: Secondary | ICD-10-CM | POA: Diagnosis not present

## 2022-10-10 DIAGNOSIS — F3181 Bipolar II disorder: Secondary | ICD-10-CM | POA: Diagnosis not present

## 2022-10-10 DIAGNOSIS — F5081 Binge eating disorder: Secondary | ICD-10-CM | POA: Diagnosis not present

## 2022-10-10 DIAGNOSIS — F4310 Post-traumatic stress disorder, unspecified: Secondary | ICD-10-CM | POA: Diagnosis not present

## 2022-10-10 MED ORDER — SERTRALINE HCL 100 MG PO TABS
200.0000 mg | ORAL_TABLET | Freq: Every day | ORAL | 0 refills | Status: DC
  Filled 2022-10-10: qty 180, 90d supply, fill #0

## 2022-10-10 MED ORDER — QUETIAPINE FUMARATE 50 MG PO TABS
50.0000 mg | ORAL_TABLET | Freq: Every evening | ORAL | 0 refills | Status: DC
  Filled 2022-10-10: qty 90, 90d supply, fill #0

## 2022-10-10 MED ORDER — LAMOTRIGINE 100 MG PO TABS
100.0000 mg | ORAL_TABLET | Freq: Every day | ORAL | 0 refills | Status: DC
  Filled 2022-10-10: qty 90, 90d supply, fill #0

## 2022-10-10 MED ORDER — TRAZODONE HCL 100 MG PO TABS
100.0000 mg | ORAL_TABLET | Freq: Every evening | ORAL | 0 refills | Status: DC | PRN
  Filled 2022-10-10: qty 90, 90d supply, fill #0

## 2022-10-15 ENCOUNTER — Other Ambulatory Visit: Payer: Self-pay

## 2022-10-16 DIAGNOSIS — F332 Major depressive disorder, recurrent severe without psychotic features: Secondary | ICD-10-CM | POA: Diagnosis not present

## 2022-10-22 DIAGNOSIS — F332 Major depressive disorder, recurrent severe without psychotic features: Secondary | ICD-10-CM | POA: Diagnosis not present

## 2022-10-30 ENCOUNTER — Other Ambulatory Visit: Payer: Self-pay

## 2022-10-30 ENCOUNTER — Other Ambulatory Visit: Payer: Self-pay | Admitting: Internal Medicine

## 2022-10-30 DIAGNOSIS — F3181 Bipolar II disorder: Secondary | ICD-10-CM | POA: Diagnosis not present

## 2022-10-30 DIAGNOSIS — F5081 Binge eating disorder: Secondary | ICD-10-CM | POA: Diagnosis not present

## 2022-10-30 DIAGNOSIS — F4310 Post-traumatic stress disorder, unspecified: Secondary | ICD-10-CM | POA: Diagnosis not present

## 2022-11-01 ENCOUNTER — Other Ambulatory Visit: Payer: Self-pay

## 2022-11-01 ENCOUNTER — Other Ambulatory Visit: Payer: Self-pay | Admitting: Internal Medicine

## 2022-11-04 DIAGNOSIS — Z96652 Presence of left artificial knee joint: Secondary | ICD-10-CM | POA: Diagnosis not present

## 2022-11-04 DIAGNOSIS — M5116 Intervertebral disc disorders with radiculopathy, lumbar region: Secondary | ICD-10-CM | POA: Diagnosis not present

## 2022-11-04 DIAGNOSIS — E1165 Type 2 diabetes mellitus with hyperglycemia: Secondary | ICD-10-CM | POA: Diagnosis not present

## 2022-11-05 ENCOUNTER — Other Ambulatory Visit: Payer: Self-pay | Admitting: Physician Assistant

## 2022-11-05 ENCOUNTER — Other Ambulatory Visit: Payer: Self-pay

## 2022-11-05 DIAGNOSIS — M5116 Intervertebral disc disorders with radiculopathy, lumbar region: Secondary | ICD-10-CM

## 2022-11-05 DIAGNOSIS — F332 Major depressive disorder, recurrent severe without psychotic features: Secondary | ICD-10-CM | POA: Diagnosis not present

## 2022-11-05 MED ORDER — OZEMPIC (1 MG/DOSE) 4 MG/3ML ~~LOC~~ SOPN
1.0000 mg | PEN_INJECTOR | SUBCUTANEOUS | 4 refills | Status: DC
  Filled 2022-11-05 – 2022-12-03 (×7): qty 3, 28d supply, fill #0
  Filled 2022-12-31: qty 3, 28d supply, fill #1
  Filled 2023-01-27: qty 3, 28d supply, fill #2
  Filled 2023-02-25: qty 3, 28d supply, fill #3
  Filled 2023-03-26: qty 3, 28d supply, fill #4

## 2022-11-07 ENCOUNTER — Other Ambulatory Visit: Payer: Self-pay

## 2022-11-08 ENCOUNTER — Other Ambulatory Visit: Payer: Self-pay

## 2022-11-12 ENCOUNTER — Other Ambulatory Visit: Payer: Self-pay

## 2022-11-12 ENCOUNTER — Other Ambulatory Visit (HOSPITAL_COMMUNITY): Payer: Self-pay

## 2022-11-13 ENCOUNTER — Ambulatory Visit (HOSPITAL_COMMUNITY)
Admission: RE | Admit: 2022-11-13 | Discharge: 2022-11-13 | Disposition: A | Discharge status: Home / Self Care | Payer: PPO | Source: Ambulatory Visit | Attending: Physician Assistant | Admitting: Physician Assistant

## 2022-11-13 DIAGNOSIS — M545 Low back pain, unspecified: Secondary | ICD-10-CM | POA: Diagnosis not present

## 2022-11-13 DIAGNOSIS — M5116 Intervertebral disc disorders with radiculopathy, lumbar region: Secondary | ICD-10-CM | POA: Diagnosis not present

## 2022-11-13 DIAGNOSIS — M5126 Other intervertebral disc displacement, lumbar region: Secondary | ICD-10-CM | POA: Diagnosis not present

## 2022-11-18 ENCOUNTER — Other Ambulatory Visit: Payer: Self-pay

## 2022-11-18 MED ORDER — HYDROCHLOROTHIAZIDE 12.5 MG PO TABS
12.5000 mg | ORAL_TABLET | Freq: Every day | ORAL | 1 refills | Status: DC
  Filled 2022-11-18: qty 90, 90d supply, fill #0
  Filled 2022-12-12 – 2023-02-14 (×2): qty 90, 90d supply, fill #1

## 2022-11-20 ENCOUNTER — Other Ambulatory Visit: Payer: Self-pay

## 2022-11-25 ENCOUNTER — Other Ambulatory Visit: Payer: Self-pay

## 2022-11-25 DIAGNOSIS — M79605 Pain in left leg: Secondary | ICD-10-CM | POA: Diagnosis not present

## 2022-11-25 MED ORDER — GABAPENTIN 100 MG PO CAPS
100.0000 mg | ORAL_CAPSULE | Freq: Two times a day (BID) | ORAL | 11 refills | Status: DC
  Filled 2022-11-25: qty 60, 25d supply, fill #0
  Filled 2023-07-01: qty 60, 25d supply, fill #1
  Filled 2023-09-09: qty 60, 25d supply, fill #2
  Filled 2023-10-12: qty 60, 25d supply, fill #3

## 2022-11-28 ENCOUNTER — Other Ambulatory Visit: Payer: Self-pay

## 2022-11-28 DIAGNOSIS — S76112D Strain of left quadriceps muscle, fascia and tendon, subsequent encounter: Secondary | ICD-10-CM | POA: Diagnosis not present

## 2022-11-28 DIAGNOSIS — M1712 Unilateral primary osteoarthritis, left knee: Secondary | ICD-10-CM | POA: Diagnosis not present

## 2022-11-28 DIAGNOSIS — M25462 Effusion, left knee: Secondary | ICD-10-CM | POA: Diagnosis not present

## 2022-11-28 DIAGNOSIS — M79605 Pain in left leg: Secondary | ICD-10-CM | POA: Diagnosis not present

## 2022-11-28 DIAGNOSIS — Z96652 Presence of left artificial knee joint: Secondary | ICD-10-CM | POA: Diagnosis not present

## 2022-12-03 ENCOUNTER — Other Ambulatory Visit: Payer: Self-pay

## 2022-12-09 ENCOUNTER — Other Ambulatory Visit: Payer: Self-pay

## 2022-12-09 MED ORDER — FLUCONAZOLE 150 MG PO TABS
150.0000 mg | ORAL_TABLET | Freq: Once | ORAL | 2 refills | Status: AC
  Filled 2022-12-09: qty 1, 1d supply, fill #0

## 2022-12-09 MED ORDER — TRIAMCINOLONE ACETONIDE 0.1 % EX CREA
1.0000 | TOPICAL_CREAM | Freq: Two times a day (BID) | CUTANEOUS | 0 refills | Status: DC
  Filled 2022-12-09: qty 30, 15d supply, fill #0

## 2022-12-10 ENCOUNTER — Other Ambulatory Visit: Payer: Self-pay

## 2022-12-10 MED ORDER — DICLOFENAC SODIUM 75 MG PO TBEC
75.0000 mg | DELAYED_RELEASE_TABLET | Freq: Two times a day (BID) | ORAL | 0 refills | Status: DC
  Filled 2022-12-10: qty 60, 30d supply, fill #0

## 2022-12-12 ENCOUNTER — Other Ambulatory Visit: Payer: Self-pay

## 2022-12-13 ENCOUNTER — Other Ambulatory Visit: Payer: Self-pay

## 2022-12-17 ENCOUNTER — Other Ambulatory Visit: Payer: Self-pay

## 2022-12-19 ENCOUNTER — Other Ambulatory Visit: Payer: Self-pay

## 2022-12-24 ENCOUNTER — Other Ambulatory Visit: Payer: Self-pay

## 2022-12-27 ENCOUNTER — Other Ambulatory Visit: Payer: Self-pay

## 2022-12-31 ENCOUNTER — Other Ambulatory Visit: Payer: Self-pay

## 2023-01-01 ENCOUNTER — Other Ambulatory Visit: Payer: Self-pay

## 2023-01-01 MED ORDER — CLOTRIMAZOLE-BETAMETHASONE 1-0.05 % EX CREA
1.0000 | TOPICAL_CREAM | Freq: Every day | CUTANEOUS | 3 refills | Status: DC | PRN
  Filled 2023-01-01: qty 45, 45d supply, fill #0
  Filled 2023-07-01: qty 45, 45d supply, fill #1
  Filled 2023-09-19 – 2023-12-23 (×3): qty 45, 45d supply, fill #2

## 2023-01-02 ENCOUNTER — Other Ambulatory Visit: Payer: Self-pay

## 2023-01-02 MED ORDER — TRIAMCINOLONE ACETONIDE 0.1 % EX CREA
TOPICAL_CREAM | Freq: Two times a day (BID) | CUTANEOUS | 1 refills | Status: DC
  Filled 2023-01-02: qty 30, 15d supply, fill #0
  Filled 2023-07-01: qty 30, 15d supply, fill #1

## 2023-01-03 ENCOUNTER — Other Ambulatory Visit: Payer: Self-pay

## 2023-01-06 ENCOUNTER — Other Ambulatory Visit: Payer: Self-pay

## 2023-01-08 ENCOUNTER — Other Ambulatory Visit: Payer: Self-pay

## 2023-01-08 MED ORDER — LAMOTRIGINE 100 MG PO TABS
100.0000 mg | ORAL_TABLET | Freq: Every day | ORAL | 0 refills | Status: DC
  Filled 2023-01-08 (×2): qty 90, 90d supply, fill #0

## 2023-01-08 MED ORDER — QUETIAPINE FUMARATE 50 MG PO TABS
50.0000 mg | ORAL_TABLET | Freq: Every evening | ORAL | 0 refills | Status: DC
  Filled 2023-01-08 (×2): qty 90, 90d supply, fill #0

## 2023-01-08 MED ORDER — TRAZODONE HCL 100 MG PO TABS
100.0000 mg | ORAL_TABLET | Freq: Every evening | ORAL | 0 refills | Status: DC | PRN
  Filled 2023-01-08: qty 90, 90d supply, fill #0

## 2023-01-08 MED ORDER — SERTRALINE HCL 100 MG PO TABS
200.0000 mg | ORAL_TABLET | Freq: Every day | ORAL | 0 refills | Status: DC
  Filled 2023-01-08: qty 180, 90d supply, fill #0

## 2023-01-20 ENCOUNTER — Other Ambulatory Visit: Payer: Self-pay

## 2023-01-20 MED ORDER — DICLOFENAC SODIUM 75 MG PO TBEC
75.0000 mg | DELAYED_RELEASE_TABLET | Freq: Two times a day (BID) | ORAL | 0 refills | Status: DC
  Filled 2023-01-20: qty 60, 30d supply, fill #0

## 2023-01-27 ENCOUNTER — Other Ambulatory Visit: Payer: Self-pay

## 2023-02-13 ENCOUNTER — Other Ambulatory Visit: Payer: Self-pay

## 2023-02-13 MED ORDER — MELOXICAM 15 MG PO TABS
15.0000 mg | ORAL_TABLET | Freq: Every day | ORAL | 2 refills | Status: DC
  Filled 2023-02-13: qty 30, 30d supply, fill #0
  Filled 2023-04-18: qty 30, 30d supply, fill #1
  Filled 2023-05-13: qty 30, 30d supply, fill #2

## 2023-02-20 DIAGNOSIS — L4050 Arthropathic psoriasis, unspecified: Secondary | ICD-10-CM | POA: Insufficient documentation

## 2023-02-25 ENCOUNTER — Other Ambulatory Visit: Payer: Self-pay

## 2023-02-26 ENCOUNTER — Other Ambulatory Visit: Payer: Self-pay

## 2023-03-10 ENCOUNTER — Other Ambulatory Visit: Payer: Self-pay

## 2023-03-10 MED ORDER — LAMOTRIGINE 100 MG PO TABS
100.0000 mg | ORAL_TABLET | Freq: Every day | ORAL | 0 refills | Status: DC
  Filled 2023-03-10 – 2023-03-17 (×2): qty 90, 90d supply, fill #0

## 2023-03-10 MED ORDER — SERTRALINE HCL 100 MG PO TABS
200.0000 mg | ORAL_TABLET | Freq: Every day | ORAL | 0 refills | Status: DC
  Filled 2023-03-10: qty 180, 90d supply, fill #0

## 2023-03-10 MED ORDER — QUETIAPINE FUMARATE 50 MG PO TABS
75.0000 mg | ORAL_TABLET | Freq: Every evening | ORAL | 0 refills | Status: DC
  Filled 2023-03-10: qty 135, 90d supply, fill #0

## 2023-03-10 MED ORDER — TRAZODONE HCL 100 MG PO TABS
100.0000 mg | ORAL_TABLET | Freq: Every evening | ORAL | 0 refills | Status: DC | PRN
  Filled 2023-03-10: qty 90, 90d supply, fill #0

## 2023-03-12 ENCOUNTER — Other Ambulatory Visit: Payer: Self-pay

## 2023-03-13 ENCOUNTER — Other Ambulatory Visit: Payer: Self-pay

## 2023-03-14 ENCOUNTER — Other Ambulatory Visit: Payer: Self-pay

## 2023-03-14 MED ORDER — VERAPAMIL HCL ER 240 MG PO CP24
240.0000 mg | ORAL_CAPSULE | Freq: Every day | ORAL | 3 refills | Status: DC
  Filled 2023-03-14: qty 30, 30d supply, fill #0
  Filled 2023-04-18: qty 30, 30d supply, fill #1
  Filled 2023-05-13: qty 30, 30d supply, fill #2
  Filled 2023-06-11: qty 30, 30d supply, fill #3

## 2023-03-17 ENCOUNTER — Other Ambulatory Visit: Payer: Self-pay

## 2023-03-26 ENCOUNTER — Other Ambulatory Visit: Payer: Self-pay

## 2023-03-27 ENCOUNTER — Other Ambulatory Visit: Payer: Self-pay

## 2023-04-02 ENCOUNTER — Other Ambulatory Visit: Payer: Self-pay

## 2023-04-08 ENCOUNTER — Other Ambulatory Visit: Payer: Self-pay

## 2023-04-09 ENCOUNTER — Other Ambulatory Visit: Payer: Self-pay

## 2023-04-11 ENCOUNTER — Other Ambulatory Visit: Payer: Self-pay

## 2023-04-11 MED ORDER — CIPROFLOXACIN HCL 500 MG PO TABS
500.0000 mg | ORAL_TABLET | Freq: Two times a day (BID) | ORAL | 0 refills | Status: DC
  Filled 2023-04-11: qty 14, 7d supply, fill #0

## 2023-04-14 ENCOUNTER — Other Ambulatory Visit: Payer: Self-pay

## 2023-04-18 ENCOUNTER — Other Ambulatory Visit: Payer: Self-pay

## 2023-04-21 ENCOUNTER — Other Ambulatory Visit: Payer: Self-pay

## 2023-04-21 MED ORDER — ESTRADIOL 1 MG PO TABS
1.0000 mg | ORAL_TABLET | Freq: Every day | ORAL | 1 refills | Status: DC
  Filled 2023-04-21: qty 90, 90d supply, fill #0
  Filled 2023-07-01: qty 90, 90d supply, fill #1

## 2023-04-22 ENCOUNTER — Other Ambulatory Visit: Payer: Self-pay

## 2023-04-23 ENCOUNTER — Other Ambulatory Visit: Payer: Self-pay

## 2023-04-23 MED ORDER — OZEMPIC (1 MG/DOSE) 4 MG/3ML ~~LOC~~ SOPN
1.0000 mg | PEN_INJECTOR | SUBCUTANEOUS | 4 refills | Status: DC
  Filled 2023-04-23: qty 3, 28d supply, fill #0
  Filled 2023-05-19: qty 3, 28d supply, fill #1

## 2023-04-24 ENCOUNTER — Other Ambulatory Visit: Payer: Self-pay

## 2023-04-25 ENCOUNTER — Other Ambulatory Visit: Payer: Self-pay

## 2023-04-25 MED ORDER — OZEMPIC (1 MG/DOSE) 4 MG/3ML ~~LOC~~ SOPN
1.0000 mg | PEN_INJECTOR | SUBCUTANEOUS | 4 refills | Status: DC
Start: 2023-04-25 — End: 2024-03-08
  Filled 2023-04-25: qty 30, 280d supply, fill #0
  Filled 2023-06-19: qty 9, 84d supply, fill #0
  Filled 2023-09-09: qty 9, 84d supply, fill #1
  Filled 2023-11-21: qty 9, 84d supply, fill #2
  Filled 2024-02-03 – 2024-03-02 (×2): qty 3, 28d supply, fill #3

## 2023-05-09 ENCOUNTER — Other Ambulatory Visit: Payer: Self-pay

## 2023-05-09 MED ORDER — PAXLOVID (150/100) 10 X 150 MG & 10 X 100MG PO TBPK
2.0000 | ORAL_TABLET | Freq: Two times a day (BID) | ORAL | 0 refills | Status: DC
  Filled 2023-05-09: qty 20, 5d supply, fill #0

## 2023-05-09 MED ORDER — MOLNUPIRAVIR 200 MG PO CAPS
4.0000 | ORAL_CAPSULE | Freq: Two times a day (BID) | ORAL | 0 refills | Status: DC
  Filled 2023-05-09: qty 40, 5d supply, fill #0

## 2023-05-13 ENCOUNTER — Other Ambulatory Visit: Payer: Self-pay

## 2023-05-14 ENCOUNTER — Other Ambulatory Visit: Payer: Self-pay

## 2023-05-15 ENCOUNTER — Other Ambulatory Visit: Payer: Self-pay

## 2023-05-16 ENCOUNTER — Other Ambulatory Visit: Payer: Self-pay

## 2023-05-16 MED ORDER — HYDROCHLOROTHIAZIDE 12.5 MG PO TABS
12.5000 mg | ORAL_TABLET | Freq: Every day | ORAL | 3 refills | Status: DC
  Filled 2023-05-16: qty 90, 90d supply, fill #0
  Filled 2023-07-01 – 2023-08-07 (×2): qty 90, 90d supply, fill #1
  Filled 2023-09-19: qty 90, 90d supply, fill #2

## 2023-05-19 ENCOUNTER — Other Ambulatory Visit: Payer: Self-pay

## 2023-05-26 ENCOUNTER — Other Ambulatory Visit: Payer: Self-pay

## 2023-05-26 MED ORDER — DICLOFENAC SODIUM 75 MG PO TBEC
75.0000 mg | DELAYED_RELEASE_TABLET | Freq: Two times a day (BID) | ORAL | 1 refills | Status: DC
  Filled 2023-05-26: qty 60, 30d supply, fill #0
  Filled 2023-07-01: qty 60, 30d supply, fill #1

## 2023-06-03 ENCOUNTER — Other Ambulatory Visit: Payer: Self-pay

## 2023-06-05 ENCOUNTER — Other Ambulatory Visit: Payer: Self-pay

## 2023-06-05 MED ORDER — LAMOTRIGINE 100 MG PO TABS
100.0000 mg | ORAL_TABLET | Freq: Every day | ORAL | 0 refills | Status: DC
  Filled 2023-09-19: qty 90, 90d supply, fill #0

## 2023-06-05 MED ORDER — QUETIAPINE FUMARATE 50 MG PO TABS
50.0000 mg | ORAL_TABLET | Freq: Every evening | ORAL | 0 refills | Status: DC
  Filled 2023-09-19 – 2023-10-12 (×2): qty 90, 90d supply, fill #0

## 2023-06-05 MED ORDER — SERTRALINE HCL 100 MG PO TABS
200.0000 mg | ORAL_TABLET | Freq: Every day | ORAL | 0 refills | Status: DC
  Filled 2023-07-01: qty 180, 90d supply, fill #0

## 2023-06-05 MED ORDER — SERTRALINE HCL 50 MG PO TABS
50.0000 mg | ORAL_TABLET | Freq: Every evening | ORAL | 0 refills | Status: DC
  Filled 2023-06-05: qty 90, 90d supply, fill #0

## 2023-06-11 ENCOUNTER — Other Ambulatory Visit: Payer: Self-pay

## 2023-06-19 ENCOUNTER — Other Ambulatory Visit: Payer: Self-pay

## 2023-07-01 ENCOUNTER — Other Ambulatory Visit: Payer: Self-pay | Admitting: Internal Medicine

## 2023-07-01 ENCOUNTER — Other Ambulatory Visit: Payer: Self-pay

## 2023-07-01 DIAGNOSIS — B379 Candidiasis, unspecified: Secondary | ICD-10-CM

## 2023-07-02 ENCOUNTER — Other Ambulatory Visit: Payer: Self-pay

## 2023-07-03 ENCOUNTER — Other Ambulatory Visit: Payer: Self-pay

## 2023-07-04 ENCOUNTER — Other Ambulatory Visit: Payer: Self-pay

## 2023-07-04 MED ORDER — CLOTRIMAZOLE-BETAMETHASONE 1-0.05 % EX CREA
1.0000 | TOPICAL_CREAM | Freq: Every day | CUTANEOUS | 3 refills | Status: DC | PRN
  Filled 2023-07-04: qty 45, 45d supply, fill #0
  Filled 2023-09-19: qty 45, 90d supply, fill #0
  Filled 2023-12-23: qty 45, 90d supply, fill #1

## 2023-07-04 MED ORDER — VERAPAMIL HCL ER 240 MG PO TBCR
240.0000 mg | EXTENDED_RELEASE_TABLET | Freq: Every day | ORAL | 1 refills | Status: DC
  Filled 2023-07-04: qty 90, 90d supply, fill #0
  Filled 2023-09-19: qty 90, 90d supply, fill #1

## 2023-07-04 MED ORDER — METFORMIN HCL ER 500 MG PO TB24
500.0000 mg | ORAL_TABLET | Freq: Two times a day (BID) | ORAL | 3 refills | Status: DC
  Filled 2023-07-04: qty 180, 90d supply, fill #0
  Filled 2023-09-19: qty 180, 90d supply, fill #1
  Filled 2023-12-23: qty 180, 90d supply, fill #2

## 2023-07-04 MED ORDER — ESTRADIOL 1 MG PO TABS
1.0000 mg | ORAL_TABLET | Freq: Every day | ORAL | 1 refills | Status: DC
  Filled 2023-07-04 – 2024-02-03 (×3): qty 90, 90d supply, fill #0
  Filled ????-??-??: fill #0

## 2023-07-06 ENCOUNTER — Other Ambulatory Visit: Payer: Self-pay

## 2023-07-07 ENCOUNTER — Other Ambulatory Visit: Payer: Self-pay

## 2023-07-07 ENCOUNTER — Encounter: Payer: Self-pay | Admitting: Emergency Medicine

## 2023-07-07 ENCOUNTER — Ambulatory Visit
Admission: EM | Admit: 2023-07-07 | Discharge: 2023-07-07 | Disposition: A | Discharge status: Home / Self Care | Payer: PPO | Attending: Emergency Medicine | Admitting: Emergency Medicine

## 2023-07-07 DIAGNOSIS — R350 Frequency of micturition: Secondary | ICD-10-CM | POA: Diagnosis present

## 2023-07-07 DIAGNOSIS — N898 Other specified noninflammatory disorders of vagina: Secondary | ICD-10-CM | POA: Insufficient documentation

## 2023-07-07 LAB — POCT URINALYSIS DIP (MANUAL ENTRY)
Bilirubin, UA: NEGATIVE
Blood, UA: NEGATIVE
Glucose, UA: NEGATIVE mg/dL
Ketones, POC UA: NEGATIVE mg/dL
Leukocytes, UA: NEGATIVE
Nitrite, UA: NEGATIVE
Protein Ur, POC: NEGATIVE mg/dL
Spec Grav, UA: 1.025 (ref 1.010–1.025)
Urobilinogen, UA: 0.2 U/dL
pH, UA: 5.5 (ref 5.0–8.0)

## 2023-07-07 MED ORDER — CEPHALEXIN 500 MG PO CAPS
500.0000 mg | ORAL_CAPSULE | Freq: Three times a day (TID) | ORAL | 0 refills | Status: AC
  Filled 2023-07-07: qty 15, 5d supply, fill #0

## 2023-07-07 MED ORDER — FLUAD 0.5 ML IM SUSY
0.5000 mL | PREFILLED_SYRINGE | Freq: Once | INTRAMUSCULAR | 0 refills | Status: AC
  Filled 2023-07-07: qty 0.5, 1d supply, fill #0

## 2023-07-07 MED ORDER — FLUCONAZOLE 150 MG PO TABS
150.0000 mg | ORAL_TABLET | Freq: Every day | ORAL | 0 refills | Status: AC
  Filled 2023-07-07: qty 1, 1d supply, fill #0

## 2023-07-07 NOTE — Discharge Instructions (Addendum)
Today you were evaluated for your urinary and vaginal symptoms  Your urinalysis not show any signs of infection , your urine will be sent to the lab to determine exactly which bacteria is present, if any changes need to be made to your medications you will be notified  vaginal swab checking for yeast and bacterial vaginosis is pending, you will be notified of positive test results only in treatment sent at time of notification  Begin use of  keflex every 8 hours for 5 days  You may take 1 Diflucan tablet for treatment of yeast  You may use over-the-counter Pyridium to help minimize your symptoms until antibiotic removes bacteria, this medication will turn your urine orange  Increase your fluid intake through use of water  As always practice good hygiene, wiping front to back and avoidance of scented vaginal products to prevent further irritation  If symptoms continue to persist after use of medication or recur please follow-up with urgent care or your primary doctor as needed

## 2023-07-07 NOTE — ED Triage Notes (Signed)
Pt presents with dysuria, vaginal discharge and irritation for appx 2 weeks. Pt was out of town and was taking OTC treatment.

## 2023-07-07 NOTE — ED Provider Notes (Signed)
Renaldo Fiddler    CSN: [id number] Arrival date & time: 07/07/23  1359      History   Chief Complaint Chief Complaint  Patient presents with   Vaginal Discharge   Dysuria   Vaginal Itching    HPI April Singleton is a 72 y.o. female.   Patient presents for evaluation of urinary frequency, urgency, dysuria, lower abdominal pressure, vaginal discharge, itching and irritation present for 2 weeks.  Over the last few days has begun to experience a low-grade fever peaking at 100.  Has attempted use of Pyridium and over-the-counter Monistat which has been somewhat helpful but symptoms have continued to persist.  Unable to determine presence of hematuria due to Pyridium changing color of urine.  Denies flank pain.  Past Medical History:  Diagnosis Date   Anemia    Arthritis    Asthma    Chronic kidney disease    Depression    Diabetes mellitus without complication (HCC)    GERD (gastroesophageal reflux disease)    History of kidney stones    Hypertension    Pneumonia    PONV (postoperative nausea and vomiting)     Patient Active Problem List   Diagnosis Date Noted   Total knee replacement status 04/29/2022   Pain of right hip 04/05/2022   Arthritis of right hip 04/05/2022   Trochanteric bursitis of right hip 04/05/2022   Urinary frequency 03/26/2022   Primary osteoarthritis of left knee 02/10/2022   Panniculitis 06/12/2021   Back pain 06/12/2021   Primary osteoarthritis of right knee 10/26/2020   Dyslipidemia 07/24/2020   Arthritis 07/24/2020   Cirrhosis of liver without ascites (HCC) 07/24/2020   Rotator cuff arthropathy of left shoulder 03/27/2020   Class 2 obesity due to excess calories without serious comorbidity with body mass index (BMI) of 39.0 to 39.9 in adult 03/27/2020   Annual physical exam 03/27/2020   Essential hypertension 03/27/2020   Type 2 diabetes mellitus with hyperglycemia, without long-term current use of insulin (HCC) 03/15/2020   Burning  with urination 03/15/2020   Obesity (BMI 35.0-39.9 without comorbidity) 03/15/2020   Chronic fatigue 03/25/2018   Chronic foot pain, right 06/10/2016   Hyperlipidemia 05/21/2016   Pain in unspecified joint 05/21/2016   Right upper quadrant pain 06/29/2014    Past Surgical History:  Procedure Laterality Date   ABDOMINAL HYSTERECTOMY     BREAST BIOPSY     Pt unsure of type of biopsy or even of which side   KNEE ARTHROPLASTY Left 04/29/2022   Procedure: COMPUTER ASSISTED TOTAL KNEE ARTHROPLASTY;  Surgeon: Donato Heinz, MD;  Location: [facility] ORS;  Service: Orthopedics;  Laterality: Left;   REDUCTION MAMMAPLASTY     TONSILLECTOMY      OB History     Gravida  3   Para  3   Term  3   Preterm      AB      Living  3      SAB      IAB      Ectopic      Multiple      Live Births  3            Home Medications    Prior to Admission medications   Medication Sig Start Date End Date Taking? Authorizing Provider  cephALEXin (KEFLEX) 500 MG capsule Take 1 capsule (500 mg total) by mouth 3 (three) times daily for 5 days. 07/07/23 07/12/23 Yes Onyekachi Gathright, Elita Boone, NP  fluconazole (  DIFLUCAN) 150 MG tablet Take 1 tablet (150 mg total) by mouth daily for 1 dose. 07/07/23 07/08/23 Yes Axelle Szwed, Elita Boone, NP  acetaminophen (TYLENOL) 500 MG tablet Take 1,000 mg by mouth every 6 (six) hours as needed.    [provider]  Asenapine Maleate (SAPHRIS) 2.5 MG SUBL Take 1 at bedtime 01/16/22     busPIRone (BUSPAR) 10 MG tablet Take 1 tablet (10 mg total) by mouth 2 (two) times daily. 06/18/22     ciprofloxacin (CIPRO) 500 MG tablet Take 1 tablet (500 mg total) by mouth 2 (two) times daily. 04/11/23     clonazePAM (KLONOPIN) 0.5 MG tablet Take 1 daily as needed 06/24/22     clotrimazole-betamethasone (LOTRISONE) cream Apply 1 Application topically daily as needed. 01/01/23     clotrimazole-betamethasone (LOTRISONE) cream Apply 1 Application topically daily as needed. 07/04/23      cyclobenzaprine (FLEXERIL) 10 MG tablet Take 1 tablet (10 mg total) by mouth 3 (three) times daily as needed for Muscle spasms for up to 10 days For muscle spasm 04/09/22     diclofenac (VOLTAREN) 75 MG EC tablet Take 1 tablet (75 mg total) by mouth 2 (two) times daily with a meal. Take after food. 01/20/23     diclofenac (VOLTAREN) 75 MG EC tablet Take 1 tablet (75 mg total) by mouth 2 (two) times daily with a meal. 05/26/23     enoxaparin (LOVENOX) 40 MG/0.4ML injection Inject 0.4 mLs (40 mg total) into the skin daily for 14 days. 04/30/22 05/14/22  Madelyn Flavors, PA-C  estradiol (ESTRACE) 1 MG tablet TAKE 1 TABLET (1 MG TOTAL) BY MOUTH DAILY. 09/11/22 09/11/23  Corky Downs, MD  estradiol (ESTRACE) 1 MG tablet Take 1 tablet (1 mg total) by mouth daily. 04/21/23     estradiol (ESTRACE) 1 MG tablet Take 1 tablet (1 mg total) by mouth daily. 07/04/23     gabapentin (NEURONTIN) 100 MG capsule Take 1 capsule (100 mg total) by mouth 2 (two) times daily. If no improvement after 5 days increase to 3 times daily 11/25/22     hydrochlorothiazide (HYDRODIURIL) 12.5 MG tablet TAKE 1 TABLET BY MOUTH ONCE DAILY WITH LOSARTAN 10/03/21 11/04/22  Corky Downs, MD  hydrochlorothiazide (HYDRODIURIL) 12.5 MG tablet Take 1 tablet (12.5 mg total) by mouth daily. 05/16/23     ipratropium (ATROVENT) 0.06 % nasal spray Place 2 sprays into both nostrils 3 (three) times daily as needed for Rhinitis 11/07/21     lamoTRIgine (LAMICTAL) 100 MG tablet Take 1 tablet (100mg  total) by mouth once daily. 06/05/23     lamoTRIgine (LAMICTAL) 150 MG tablet Take 1 tablet (150 mg total) by mouth daily beginning on 07/31/22. 07/09/22     lamoTRIgine (LAMICTAL) 25 MG tablet Take 1 tablet (25 mg total) by mouth daily for 14 days, THEN 2 tablets (50 mg total) daily with breakfast 06/18/22 07/18/22    lisdexamfetamine (VYVANSE) 20 MG capsule Take 1 daily 10/02/21     losartan (COZAAR) 50 MG tablet TAKE 1 TABLET BY MOUTH DAILY WITH HCTZ 07/31/22 10/15/23   Corky Downs, MD  losartan-hydrochlorothiazide (HYZAAR) 50-12.5 MG tablet TAKE 1 TABLET BY MOUTH DAILY 10/02/20   Corky Downs, MD  meloxicam (MOBIC) 15 MG tablet Take 1 tablet (15 mg total) by mouth daily. 02/13/23     metFORMIN (GLUCOPHAGE-XR) 500 MG 24 hr tablet TAKE 1 TABLET BY MOUTH TWICE DAILY 07/31/22   Corky Downs, MD  metFORMIN (GLUCOPHAGE-XR) 500 MG 24 hr tablet Take 1 tablet (500  mg total) by mouth 2 (two) times daily. 07/04/23     molnupiravir EUA (LAGEVRIO) 200 MG CAPS capsule Take 4 capsules (800 mg total) by mouth 2 (two) times daily. 05/09/23     OLANZapine (ZYPREXA) 5 MG tablet Take 1 at bedtime 05/28/22     ondansetron (ZOFRAN-ODT) 4 MG disintegrating tablet Take 1 tablet (4 mg total) by mouth every 8 (eight) hours as needed for nausea or vomiting. 02/08/22   Lorelee New, PA-C  oxyCODONE (OXY IR/ROXICODONE) 5 MG immediate release tablet Take 1 tablet (5 mg total) by mouth every 4 (four) hours as needed for moderate pain (pain score 4-6). 04/30/22   Madelyn Flavors, PA-C  pantoprazole (PROTONIX) 40 MG tablet Take 1 tablet (40 mg total) by mouth as directed. Take 30-45 minutes before breakfast. 02/01/22     QUEtiapine (SEROQUEL) 50 MG tablet Take 1 tablet (50mg  total) by mouth once nightly at bedtime. 06/05/23     RSV vaccine recomb adjuvanted (AREXVY) 120 MCG/0.5ML injection Inject into the muscle. 08/06/22   Judyann Munson, MD  Semaglutide, 1 MG/DOSE, (OZEMPIC, 1 MG/DOSE,) 4 MG/3ML SOPN Inject 1 mg into the skin once a week. 04/23/23     Semaglutide, 1 MG/DOSE, (OZEMPIC, 1 MG/DOSE,) 4 MG/3ML SOPN Inject 1 mg into the skin once a week. 04/25/23     sertraline (ZOLOFT) 100 MG tablet Take 1 and 1/2 tablet po daily with breakfast 01/16/22     sertraline (ZOLOFT) 100 MG tablet Take 2 tablets (200 mg total) by mouth daily with breakfast. 07/09/22     sertraline (ZOLOFT) 100 MG tablet Take 2 tablets (200 mg total) by mouth daily with breakfast. 10/10/22     sertraline (ZOLOFT) 100 MG tablet Take  2 tablets (200mg  total) by mouth once daily with breakfast. 06/05/23     sertraline (ZOLOFT) 50 MG tablet Take 1 tablet (50mg  total) by mouth once nightly at bedtime. 06/05/23     tiZANidine (ZANAFLEX) 2 MG tablet Take 1 tablet (2 mg total) by mouth every 8 (eight) hours as needed for Muscle spasms 05/17/22     traMADol (ULTRAM) 50 MG tablet Take 1 tablet (50 mg total) by mouth every 4 (four) hours as needed for moderate pain. 04/30/22   Madelyn Flavors, PA-C  traZODone (DESYREL) 100 MG tablet Take 1 to 2 tablets by mouth nightly as needed for sleep 07/09/22     traZODone (DESYREL) 100 MG tablet Take 1 tablet (100 mg total) by mouth at bedtime as needed. 10/10/22     traZODone (DESYREL) 100 MG tablet Take 1 tablet (100 mg total) by mouth at bedtime as needed. 01/08/23     traZODone (DESYREL) 100 MG tablet Take 1 tablet (100 mg total) by mouth at bedtime as needed. 03/10/23     traZODone (DESYREL) 50 MG tablet Take 1 at bedtime as needed 05/28/22     triamcinolone cream (KENALOG) 0.1 % Apply 1 Application topically 2 (two) times daily. 12/09/22     triamcinolone cream (KENALOG) 0.1 % Apply topically 2 (two) times daily 01/02/23     verapamil (CALAN-SR) 240 MG CR tablet Take 1 tablet (240 mg total) by mouth daily. 07/04/23     verapamil (VERELAN) 240 MG 24 hr capsule Take 1 capsule (240 mg total) by mouth daily. 03/14/23   Corky Downs, MD  zolpidem (AMBIEN) 5 MG tablet Take 1 tablet (5 mg total) by mouth at bedtime as needed for sleep. 03/26/22   Corky Downs, MD  Family History Family History  Problem Relation Age of Onset   Liver cancer Father    Lung cancer Father    Breast cancer Maternal Aunt     Social History Social History   Tobacco Use   Smoking status: Never   Smokeless tobacco: Never  Vaping Use   Vaping status: Never Used  Substance Use Topics   Alcohol use: Yes    Comment: rarely   Drug use: Never     Allergies   Codeine, Penicillins, Prednisone, and Sulfa  antibiotics   Review of Systems Review of Systems   Physical Exam Triage Vital Signs ED Triage Vitals  Encounter Vitals Group     BP 07/07/23 1445 (!) 145/68     Systolic BP Percentile --      Diastolic BP Percentile --      Pulse Rate 07/07/23 1445 82     Resp 07/07/23 1445 16     Temp 07/07/23 1445 98.1 F (36.7 C)     Temp Source 07/07/23 1445 Oral     SpO2 07/07/23 1445 98 %     Weight --      Height --      Head Circumference --      Peak Flow --      Pain Score 07/07/23 1446 3     Pain Loc --      Pain Education --      Exclude from Growth Chart --    No data found.  Updated Vital Signs BP (!) 145/68 (BP Location: Left Arm)   Pulse 82   Temp 98.1 F (36.7 C) (Oral)   Resp 16   SpO2 98%   Visual Acuity Right Eye Distance:   Left Eye Distance:   Bilateral Distance:    Right Eye Near:   Left Eye Near:    Bilateral Near:     Physical Exam Constitutional:      Appearance: Normal appearance.  Eyes:     Extraocular Movements: Extraocular movements intact.  Pulmonary:     Effort: Pulmonary effort is normal.  Genitourinary:    Comments: deferred Neurological:     Mental Status: She is alert and oriented to person, place, and time. Mental status is at baseline.      UC Treatments / Results  Labs (all labs ordered are listed, but only abnormal results are displayed) Labs Reviewed  URINE CULTURE  POCT URINALYSIS DIP (MANUAL ENTRY)  CERVICOVAGINAL ANCILLARY ONLY    EKG   Radiology No results found.  Procedures Procedures (including critical care time)  Medications Ordered in UC Medications - No data to display  Initial Impression / Assessment and Plan / UC Course  I have reviewed the triage vital signs and the nursing notes.  Pertinent labs & imaging results that were available during my care of the patient were reviewed by me and considered in my medical decision making (see chart for details).  Urinary frequency, vaginal  discharge  Urinalysis negative, sent for culture due to history of reoccurring UTI, vaginal swab checking for yeast and BV pending, as patient has experienced fever and asymptomatic prophylactically providing bacterial coverage, prescribed cephalexin and 1 Diflucan, discussed administration, may continue use of Pyridium as needed, discussed supportive measures through good hygiene and increase fluid intake, will make adjustments to treatment based on test results, discussed this with patient Final Clinical Impressions(s) / UC Diagnoses   Final diagnoses:  Urinary frequency  Vaginal discharge     Discharge Instructions  Today you were evaluated for your urinary and vaginal symptoms  Your urinalysis not show any signs of infection , your urine will be sent to the lab to determine exactly which bacteria is present, if any changes need to be made to your medications you will be notified  vaginal swab checking for yeast and bacterial vaginosis is pending, you will be notified of positive test results only in treatment sent at time of notification  Begin use of  keflex every 8 hours for 5 days  You may take 1 Diflucan tablet for treatment of yeast  You may use over-the-counter Pyridium to help minimize your symptoms until antibiotic removes bacteria, this medication will turn your urine orange  Increase your fluid intake through use of water  As always practice good hygiene, wiping front to back and avoidance of scented vaginal products to prevent further irritation  If symptoms continue to persist after use of medication or recur please follow-up with urgent care or your primary doctor as needed    ED Prescriptions     Medication Sig Dispense Auth. Provider   cephALEXin (KEFLEX) 500 MG capsule Take 1 capsule (500 mg total) by mouth 3 (three) times daily for 5 days. 15 capsule Darlena Koval R, NP   fluconazole (DIFLUCAN) 150 MG tablet Take 1 tablet (150 mg total) by mouth daily  for 1 dose. 1 tablet Valinda Hoar, NP      PDMP not reviewed this encounter.   Valinda Hoar, NP 07/07/23 1529

## 2023-07-08 LAB — CERVICOVAGINAL ANCILLARY ONLY
Bacterial Vaginitis (gardnerella): NEGATIVE
Candida Glabrata: NEGATIVE
Candida Vaginitis: NEGATIVE
Comment: NEGATIVE
Comment: NEGATIVE
Comment: NEGATIVE

## 2023-07-09 LAB — URINE CULTURE: Culture: <10000 — AB

## 2023-07-11 ENCOUNTER — Other Ambulatory Visit: Payer: Self-pay

## 2023-07-11 MED ORDER — XIIDRA 5 % OP SOLN
1.0000 [drp] | Freq: Two times a day (BID) | OPHTHALMIC | 4 refills | Status: DC
  Filled 2023-07-11: qty 180, 90d supply, fill #0
  Filled 2023-09-19: qty 180, 90d supply, fill #1
  Filled 2024-02-03: qty 180, 90d supply, fill #2
  Filled 2024-05-14: qty 180, 90d supply, fill #3

## 2023-07-16 ENCOUNTER — Other Ambulatory Visit: Payer: Self-pay | Admitting: Internal Medicine

## 2023-07-16 DIAGNOSIS — Z1231 Encounter for screening mammogram for malignant neoplasm of breast: Secondary | ICD-10-CM

## 2023-07-23 ENCOUNTER — Other Ambulatory Visit: Payer: Self-pay

## 2023-07-23 MED ORDER — ROSUVASTATIN CALCIUM 10 MG PO TABS
10.0000 mg | ORAL_TABLET | Freq: Every day | ORAL | 1 refills | Status: DC
  Filled 2023-07-23 – 2023-09-19 (×2): qty 90, 90d supply, fill #0
  Filled 2023-12-23: qty 90, 90d supply, fill #1

## 2023-08-06 ENCOUNTER — Other Ambulatory Visit: Payer: Self-pay

## 2023-08-07 ENCOUNTER — Other Ambulatory Visit: Payer: Self-pay

## 2023-08-07 MED ORDER — TRAZODONE HCL 100 MG PO TABS
100.0000 mg | ORAL_TABLET | Freq: Every evening | ORAL | 0 refills | Status: DC | PRN
  Filled 2023-08-07: qty 90, 90d supply, fill #0

## 2023-08-07 MED ORDER — LAMOTRIGINE 100 MG PO TABS
100.0000 mg | ORAL_TABLET | Freq: Every day | ORAL | 0 refills | Status: DC
  Filled 2023-08-07: qty 90, 90d supply, fill #0

## 2023-08-07 MED ORDER — QUETIAPINE FUMARATE 50 MG PO TABS
50.0000 mg | ORAL_TABLET | Freq: Every day | ORAL | 0 refills | Status: DC
  Filled 2023-08-07: qty 90, 90d supply, fill #0

## 2023-08-07 MED ORDER — SERTRALINE HCL 100 MG PO TABS
200.0000 mg | ORAL_TABLET | Freq: Every day | ORAL | 0 refills | Status: DC
  Filled 2023-08-07 – 2023-10-12 (×2): qty 180, 90d supply, fill #0

## 2023-09-05 ENCOUNTER — Ambulatory Visit
Admission: RE | Admit: 2023-09-05 | Discharge: 2023-09-05 | Disposition: A | Discharge status: Home / Self Care | Payer: PPO | Source: Ambulatory Visit | Attending: Internal Medicine | Admitting: Internal Medicine

## 2023-09-05 DIAGNOSIS — Z1231 Encounter for screening mammogram for malignant neoplasm of breast: Secondary | ICD-10-CM | POA: Diagnosis present

## 2023-09-09 ENCOUNTER — Other Ambulatory Visit: Payer: Self-pay

## 2023-09-09 ENCOUNTER — Other Ambulatory Visit: Payer: Self-pay | Admitting: Internal Medicine

## 2023-09-10 ENCOUNTER — Other Ambulatory Visit: Payer: Self-pay

## 2023-09-10 MED ORDER — ESTRADIOL 1 MG PO TABS
1.0000 mg | ORAL_TABLET | Freq: Every day | ORAL | 1 refills | Status: DC
  Filled 2023-09-10: qty 90, 90d supply, fill #0
  Filled 2024-02-03: qty 90, 90d supply, fill #1

## 2023-09-10 MED ORDER — TRIAMCINOLONE ACETONIDE 0.1 % EX CREA
TOPICAL_CREAM | Freq: Two times a day (BID) | CUTANEOUS | 1 refills | Status: DC
  Filled 2023-09-10: qty 30, 30d supply, fill #0
  Filled 2023-10-12: qty 30, 30d supply, fill #1

## 2023-09-10 MED ORDER — DICLOFENAC SODIUM 75 MG PO TBEC
75.0000 mg | DELAYED_RELEASE_TABLET | Freq: Two times a day (BID) | ORAL | 1 refills | Status: DC
  Filled 2023-09-10: qty 60, 30d supply, fill #0
  Filled 2023-10-12: qty 60, 30d supply, fill #1

## 2023-09-11 ENCOUNTER — Other Ambulatory Visit: Payer: Self-pay

## 2023-09-12 ENCOUNTER — Other Ambulatory Visit: Payer: Self-pay

## 2023-09-12 MED ORDER — LOSARTAN POTASSIUM 50 MG PO TABS
50.0000 mg | ORAL_TABLET | Freq: Every day | ORAL | 3 refills | Status: DC
  Filled 2023-09-12: qty 90, 90d supply, fill #0

## 2023-09-14 ENCOUNTER — Other Ambulatory Visit: Payer: Self-pay

## 2023-09-19 ENCOUNTER — Other Ambulatory Visit: Payer: Self-pay

## 2023-09-25 ENCOUNTER — Other Ambulatory Visit: Payer: Self-pay | Admitting: Orthopedic Surgery

## 2023-09-25 DIAGNOSIS — M5416 Radiculopathy, lumbar region: Secondary | ICD-10-CM

## 2023-10-02 ENCOUNTER — Inpatient Hospital Stay
Admission: RE | Admit: 2023-10-02 | Discharge: 2023-10-02 | Discharge status: Home / Self Care | Payer: PPO | Source: Ambulatory Visit | Attending: Orthopedic Surgery | Admitting: Orthopedic Surgery

## 2023-10-02 DIAGNOSIS — M5416 Radiculopathy, lumbar region: Secondary | ICD-10-CM

## 2023-10-09 ENCOUNTER — Other Ambulatory Visit: Payer: PPO

## 2023-10-12 ENCOUNTER — Other Ambulatory Visit: Payer: Self-pay

## 2023-10-12 ENCOUNTER — Other Ambulatory Visit: Payer: Self-pay | Admitting: Internal Medicine

## 2023-10-13 ENCOUNTER — Other Ambulatory Visit: Payer: Self-pay

## 2023-10-13 MED ORDER — DICLOFENAC SODIUM 75 MG PO TBEC
75.0000 mg | DELAYED_RELEASE_TABLET | Freq: Two times a day (BID) | ORAL | 0 refills | Status: DC
  Filled 2023-10-13 – 2023-12-23 (×3): qty 60, 30d supply, fill #0

## 2023-10-14 ENCOUNTER — Other Ambulatory Visit: Payer: Self-pay

## 2023-10-15 ENCOUNTER — Other Ambulatory Visit: Payer: Self-pay

## 2023-10-15 MED ORDER — LOSARTAN POTASSIUM 50 MG PO TABS
50.0000 mg | ORAL_TABLET | Freq: Every day | ORAL | 3 refills | Status: DC
  Filled 2023-10-15 – 2023-12-11 (×2): qty 90, 90d supply, fill #0
  Filled 2024-02-03: qty 90, 90d supply, fill #1

## 2023-10-15 MED ORDER — METFORMIN HCL ER 500 MG PO TB24
500.0000 mg | ORAL_TABLET | Freq: Two times a day (BID) | ORAL | 3 refills | Status: DC
  Filled 2023-10-15 – 2023-12-24 (×3): qty 180, 90d supply, fill #0

## 2023-10-15 MED ORDER — HYDROCHLOROTHIAZIDE 12.5 MG PO TABS
12.5000 mg | ORAL_TABLET | Freq: Every day | ORAL | 3 refills | Status: DC
  Filled 2023-10-15: qty 90, 90d supply, fill #0
  Filled 2024-02-03: qty 90, 90d supply, fill #1

## 2023-10-15 MED ORDER — ESTRADIOL 1 MG PO TABS
1.0000 mg | ORAL_TABLET | Freq: Every day | ORAL | 1 refills | Status: AC
  Filled 2023-10-15 – 2024-05-14 (×3): qty 90, 90d supply, fill #0
  Filled 2024-07-30: qty 90, 90d supply, fill #1

## 2023-10-15 MED ORDER — VERAPAMIL HCL ER 240 MG PO TBCR
240.0000 mg | EXTENDED_RELEASE_TABLET | Freq: Every day | ORAL | 1 refills | Status: DC
  Filled 2023-10-15 – 2023-12-23 (×2): qty 90, 90d supply, fill #0
  Filled 2024-02-03: qty 90, 90d supply, fill #1

## 2023-10-16 ENCOUNTER — Other Ambulatory Visit: Payer: Self-pay

## 2023-10-16 MED ORDER — PANTOPRAZOLE SODIUM 40 MG PO TBEC
40.0000 mg | DELAYED_RELEASE_TABLET | Freq: Every day | ORAL | 6 refills | Status: DC
  Filled 2023-10-16: qty 30, 30d supply, fill #0

## 2023-10-17 ENCOUNTER — Other Ambulatory Visit: Payer: Self-pay

## 2023-10-17 MED ORDER — PANTOPRAZOLE SODIUM 40 MG PO TBEC
40.0000 mg | DELAYED_RELEASE_TABLET | Freq: Every day | ORAL | 3 refills | Status: DC
  Filled 2023-10-17 – 2023-12-11 (×2): qty 90, 90d supply, fill #0
  Filled 2023-12-23 – 2024-03-23 (×4): qty 90, 90d supply, fill #1
  Filled 2024-05-14 – 2024-06-11 (×2): qty 90, 90d supply, fill #2
  Filled 2024-07-30: qty 90, 90d supply, fill #3

## 2023-11-03 ENCOUNTER — Other Ambulatory Visit: Payer: Self-pay

## 2023-11-03 MED ORDER — SERTRALINE HCL 100 MG PO TABS
200.0000 mg | ORAL_TABLET | Freq: Every day | ORAL | 0 refills | Status: DC
  Filled 2023-11-03 – 2023-12-23 (×2): qty 180, 90d supply, fill #0

## 2023-11-03 MED ORDER — QUETIAPINE FUMARATE 50 MG PO TABS
50.0000 mg | ORAL_TABLET | Freq: Every evening | ORAL | 0 refills | Status: DC
  Filled 2023-11-03 – 2023-12-23 (×2): qty 90, 90d supply, fill #0

## 2023-11-03 MED ORDER — TRAZODONE HCL 100 MG PO TABS
100.0000 mg | ORAL_TABLET | Freq: Every evening | ORAL | 0 refills | Status: DC | PRN
  Filled 2023-11-03: qty 90, 90d supply, fill #0

## 2023-11-03 MED ORDER — LAMOTRIGINE 100 MG PO TABS
100.0000 mg | ORAL_TABLET | Freq: Every day | ORAL | 0 refills | Status: DC
  Filled 2023-11-03: qty 90, 90d supply, fill #0

## 2023-11-05 ENCOUNTER — Other Ambulatory Visit: Payer: Self-pay

## 2023-11-21 ENCOUNTER — Other Ambulatory Visit: Payer: Self-pay

## 2023-12-11 ENCOUNTER — Other Ambulatory Visit: Payer: Self-pay

## 2023-12-11 MED ORDER — GABAPENTIN 100 MG PO CAPS
100.0000 mg | ORAL_CAPSULE | Freq: Two times a day (BID) | ORAL | 11 refills | Status: AC
  Filled 2023-12-11: qty 60, 22d supply, fill #0
  Filled 2024-02-03: qty 60, 22d supply, fill #1
  Filled 2024-03-02: qty 60, 22d supply, fill #2
  Filled 2024-05-14: qty 60, 22d supply, fill #3
  Filled 2024-06-30: qty 60, 22d supply, fill #4
  Filled 2024-07-30: qty 60, 22d supply, fill #5
  Filled 2024-10-05: qty 60, 22d supply, fill #6

## 2023-12-23 ENCOUNTER — Other Ambulatory Visit: Payer: Self-pay

## 2023-12-23 ENCOUNTER — Other Ambulatory Visit: Payer: Self-pay | Admitting: Internal Medicine

## 2023-12-24 ENCOUNTER — Other Ambulatory Visit: Payer: Self-pay

## 2023-12-24 ENCOUNTER — Ambulatory Visit
Admission: EM | Admit: 2023-12-24 | Discharge: 2023-12-24 | Disposition: A | Discharge status: Home / Self Care | Attending: Emergency Medicine | Admitting: Emergency Medicine

## 2023-12-24 DIAGNOSIS — J0100 Acute maxillary sinusitis, unspecified: Secondary | ICD-10-CM

## 2023-12-24 DIAGNOSIS — H6693 Otitis media, unspecified, bilateral: Secondary | ICD-10-CM | POA: Diagnosis not present

## 2023-12-24 MED ORDER — FREESTYLE FREEDOM LITE W/DEVICE KIT
PACK | 0 refills | Status: DC
  Filled 2023-12-24: qty 1, 1d supply, fill #0

## 2023-12-24 MED ORDER — ONETOUCH ULTRASOFT 2 LANCETS MISC
0 refills | Status: AC
  Filled 2023-12-24: qty 100, 30d supply, fill #0

## 2023-12-24 MED ORDER — AMOXICILLIN-POT CLAVULANATE 875-125 MG PO TABS
1.0000 | ORAL_TABLET | Freq: Two times a day (BID) | ORAL | 0 refills | Status: DC
  Filled 2023-12-24: qty 14, 7d supply, fill #0

## 2023-12-24 MED ORDER — GLUCOSE BLOOD VI STRP
ORAL_STRIP | 0 refills | Status: DC
  Filled 2023-12-24: qty 100, 30d supply, fill #0

## 2023-12-24 MED ORDER — BLOOD GLUCOSE MONITOR SYSTEM W/DEVICE KIT
PACK | 0 refills | Status: AC
Start: 2023-12-24 — End: ?
  Filled 2023-12-24: qty 1, 30d supply, fill #0

## 2023-12-24 NOTE — ED Triage Notes (Signed)
 Patient to Urgent Care with complaints of left sided ear pain. Has noticed some drainage. Some discomfort in her right ear.   Symptoms started last night. Difficulty sleeping d/t pain. Reports she is currently running a fever.

## 2023-12-24 NOTE — Discharge Instructions (Addendum)
 Take the Augmentin as directed.  Follow up with your primary care provider if your symptoms are not improving.

## 2023-12-24 NOTE — ED Provider Notes (Signed)
 April Singleton    CSN: [id number] Arrival date & time: 12/24/23  1025      History   Chief Complaint Chief Complaint  Patient presents with   Otalgia    HPI April Singleton is a 73 y.o. female.  Patient presents with bilateral ear pain, worse on the left side, since last night.  She reports low-grade fever, congestion, postnasal drip, cough with yellow mucus for several days.  She has been treating her symptoms with Tylenol.  No shortness of breath.  The history is provided by the patient and medical records.    Past Medical History:  Diagnosis Date   Anemia    Arthritis    Asthma    Chronic kidney disease    Depression    Diabetes mellitus without complication (HCC)    GERD (gastroesophageal reflux disease)    History of kidney stones    Hypertension    Pneumonia    PONV (postoperative nausea and vomiting)     Patient Active Problem List   Diagnosis Date Noted   Total knee replacement status 04/29/2022   Pain of right hip 04/05/2022   Arthritis of right hip 04/05/2022   Trochanteric bursitis of right hip 04/05/2022   Urinary frequency 03/26/2022   Primary osteoarthritis of left knee 02/10/2022   Panniculitis 06/12/2021   Back pain 06/12/2021   Primary osteoarthritis of right knee 10/26/2020   Dyslipidemia 07/24/2020   Arthritis 07/24/2020   Cirrhosis of liver without ascites (HCC) 07/24/2020   Rotator cuff arthropathy of left shoulder 03/27/2020   Class 2 obesity due to excess calories without serious comorbidity with body mass index (BMI) of 39.0 to 39.9 in adult 03/27/2020   Annual physical exam 03/27/2020   Essential hypertension 03/27/2020   Type 2 diabetes mellitus with hyperglycemia, without long-term current use of insulin (HCC) 03/15/2020   Burning with urination 03/15/2020   Obesity (BMI 35.0-39.9 without comorbidity) 03/15/2020   Chronic fatigue 03/25/2018   Chronic foot pain, right 06/10/2016   Hyperlipidemia 05/21/2016   Pain in  unspecified joint 05/21/2016   Right upper quadrant pain 06/29/2014    Past Surgical History:  Procedure Laterality Date   ABDOMINAL HYSTERECTOMY     BREAST BIOPSY     Pt unsure of type of biopsy or even of which side   KNEE ARTHROPLASTY Left 04/29/2022   Procedure: COMPUTER ASSISTED TOTAL KNEE ARTHROPLASTY;  Surgeon: Donato Heinz, MD;  Location: [facility] ORS;  Service: Orthopedics;  Laterality: Left;   REDUCTION MAMMAPLASTY     TONSILLECTOMY      OB History     Gravida  3   Para  3   Term  3   Preterm      AB      Living  3      SAB      IAB      Ectopic      Multiple      Live Births  3            Home Medications    Prior to Admission medications   Medication Sig Start Date End Date Taking? Authorizing Provider  amoxicillin-clavulanate (AUGMENTIN) 875-125 MG tablet Take 1 tablet by mouth every 12 (twelve) hours. 12/24/23  Yes Mickie Bail, NP  acetaminophen (TYLENOL) 500 MG tablet Take 1,000 mg by mouth every 6 (six) hours as needed.    [provider]  Asenapine Maleate (SAPHRIS) 2.5 MG SUBL Take 1 at bedtime 01/16/22  busPIRone (BUSPAR) 10 MG tablet Take 1 tablet (10 mg total) by mouth 2 (two) times daily. 06/18/22     clonazePAM (KLONOPIN) 0.5 MG tablet Take 1 daily as needed 06/24/22     clotrimazole-betamethasone (LOTRISONE) cream Apply 1 Application topically daily as needed. 01/01/23     clotrimazole-betamethasone (LOTRISONE) cream Apply 1 Application topically daily as needed. 07/04/23     cyclobenzaprine (FLEXERIL) 10 MG tablet Take 1 tablet (10 mg total) by mouth 3 (three) times daily as needed for Muscle spasms for up to 10 days For muscle spasm 04/09/22     diclofenac (VOLTAREN) 75 MG EC tablet Take 1 tablet (75 mg total) by mouth 2 (two) times daily with a meal 09/10/23     diclofenac (VOLTAREN) 75 MG EC tablet Take 1 tablet (75 mg total) by mouth 2 (two) times daily with a meal. 10/13/23     enoxaparin (LOVENOX) 40 MG/0.4ML injection  Inject 0.4 mLs (40 mg total) into the skin daily for 14 days. 04/30/22 05/14/22  Madelyn Flavors, PA-C  estradiol (ESTRACE) 1 MG tablet TAKE 1 TABLET (1 MG TOTAL) BY MOUTH DAILY. 09/11/22 09/11/23  Corky Downs, MD  estradiol (ESTRACE) 1 MG tablet Take 1 tablet (1 mg total) by mouth daily. 04/21/23     estradiol (ESTRACE) 1 MG tablet Take 1 tablet (1 mg total) by mouth daily. 07/04/23     estradiol (ESTRACE) 1 MG tablet Take 1 tablet (1 mg total) by mouth daily. 09/10/23     estradiol (ESTRACE) 1 MG tablet Take 1 tablet (1 mg total) by mouth daily. 10/15/23     gabapentin (NEURONTIN) 100 MG capsule Take 1 capsule (100 mg total) by mouth 2 (two) times daily. If no improvement after 5 days increase to 3 times daily 12/11/23     hydrochlorothiazide (HYDRODIURIL) 12.5 MG tablet TAKE 1 TABLET BY MOUTH ONCE DAILY WITH LOSARTAN 10/03/21 11/04/22  Corky Downs, MD  hydrochlorothiazide (HYDRODIURIL) 12.5 MG tablet Take 1 tablet (12.5 mg total) by mouth daily. 05/16/23     hydrochlorothiazide (HYDRODIURIL) 12.5 MG tablet Take 1 tablet (12.5 mg total) by mouth daily. 10/15/23     ipratropium (ATROVENT) 0.06 % nasal spray Place 2 sprays into both nostrils 3 (three) times daily as needed for Rhinitis 11/07/21     lamoTRIgine (LAMICTAL) 100 MG tablet Take 1 tablet (100mg  total) by mouth once daily. 06/05/23     lamoTRIgine (LAMICTAL) 100 MG tablet Take 1 tablet (100 mg total) by mouth daily. 08/07/23     lamoTRIgine (LAMICTAL) 100 MG tablet Take 1 tablet (100 mg total) by mouth daily. 11/03/23     lamoTRIgine (LAMICTAL) 150 MG tablet Take 1 tablet (150 mg total) by mouth daily beginning on 07/31/22. 07/09/22     lamoTRIgine (LAMICTAL) 25 MG tablet Take 1 tablet (25 mg total) by mouth daily for 14 days, THEN 2 tablets (50 mg total) daily with breakfast 06/18/22 07/18/22    Lifitegrast (XIIDRA) 5 % SOLN Place 1 drop into affected eyes 2 (two) times daily. 07/11/23     lisdexamfetamine (VYVANSE) 20 MG capsule Take 1 daily 10/02/21      losartan (COZAAR) 50 MG tablet TAKE 1 TABLET BY MOUTH DAILY WITH HCTZ 07/31/22 10/15/23  Corky Downs, MD  losartan (COZAAR) 50 MG tablet Take 1 tablet (50 mg total) by mouth daily. 09/12/23     losartan (COZAAR) 50 MG tablet Take 1 tablet (50 mg total) by mouth daily. 10/15/23     losartan-hydrochlorothiazide (HYZAAR) 50-12.5  MG tablet TAKE 1 TABLET BY MOUTH DAILY 10/02/20   Corky Downs, MD  meloxicam (MOBIC) 15 MG tablet Take 1 tablet (15 mg total) by mouth daily. 02/13/23     metFORMIN (GLUCOPHAGE-XR) 500 MG 24 hr tablet TAKE 1 TABLET BY MOUTH TWICE DAILY 07/31/22   Corky Downs, MD  metFORMIN (GLUCOPHAGE-XR) 500 MG 24 hr tablet Take 1 tablet (500 mg total) by mouth 2 (two) times daily. 07/04/23     metFORMIN (GLUCOPHAGE-XR) 500 MG 24 hr tablet Take 1 tablet (500 mg total) by mouth 2 (two) times daily. 10/15/23     molnupiravir EUA (LAGEVRIO) 200 MG CAPS capsule Take 4 capsules (800 mg total) by mouth 2 (two) times daily. 05/09/23     OLANZapine (ZYPREXA) 5 MG tablet Take 1 at bedtime 05/28/22     ondansetron (ZOFRAN-ODT) 4 MG disintegrating tablet Take 1 tablet (4 mg total) by mouth every 8 (eight) hours as needed for nausea or vomiting. 02/08/22   Lorelee New, PA-C  oxyCODONE (OXY IR/ROXICODONE) 5 MG immediate release tablet Take 1 tablet (5 mg total) by mouth every 4 (four) hours as needed for moderate pain (pain score 4-6). 04/30/22   Madelyn Flavors, PA-C  pantoprazole (PROTONIX) 40 MG tablet Take 1 tablet (40 mg total) by mouth as directed. Take 30-45 minutes before breakfast. 02/01/22     pantoprazole (PROTONIX) 40 MG tablet Take 1 tablet (40 mg total) by mouth daily. Take 30-45 minutes before breakfast. 10/16/23     pantoprazole (PROTONIX) 40 MG tablet Take 1 tablet (40 mg total) by mouth daily. 10/17/23     QUEtiapine (SEROQUEL) 50 MG tablet Take 1 tablet (50 mg total) by mouth at bedtime. 08/07/23     QUEtiapine (SEROQUEL) 50 MG tablet Take 1 tablet (50 mg total) by mouth at bedtime. 11/03/23      rosuvastatin (CRESTOR) 10 MG tablet Take 1 tablet (10 mg total) by mouth daily. 07/23/23     RSV vaccine recomb adjuvanted (AREXVY) 120 MCG/0.5ML injection Inject into the muscle. 08/06/22   Judyann Munson, MD  Semaglutide, 1 MG/DOSE, (OZEMPIC, 1 MG/DOSE,) 4 MG/3ML SOPN Inject 1 mg into the skin once a week. 04/23/23     Semaglutide, 1 MG/DOSE, (OZEMPIC, 1 MG/DOSE,) 4 MG/3ML SOPN Inject 1 mg into the skin once a week. 04/25/23     sertraline (ZOLOFT) 100 MG tablet Take 1 and 1/2 tablet po daily with breakfast 01/16/22     sertraline (ZOLOFT) 100 MG tablet Take 2 tablets (200 mg total) by mouth daily with breakfast. 07/09/22     sertraline (ZOLOFT) 100 MG tablet Take 2 tablets (200 mg total) by mouth daily with breakfast. 10/10/22     sertraline (ZOLOFT) 100 MG tablet Take 2 tablets (200 mg total) by mouth daily with breakfast. 11/03/23     sertraline (ZOLOFT) 50 MG tablet Take 1 tablet (50mg  total) by mouth once nightly at bedtime. 06/05/23     tiZANidine (ZANAFLEX) 2 MG tablet Take 1 tablet (2 mg total) by mouth every 8 (eight) hours as needed for Muscle spasms 05/17/22     traMADol (ULTRAM) 50 MG tablet Take 1 tablet (50 mg total) by mouth every 4 (four) hours as needed for moderate pain. 04/30/22   Madelyn Flavors, PA-C  traZODone (DESYREL) 100 MG tablet Take 1 to 2 tablets by mouth nightly as needed for sleep 07/09/22     traZODone (DESYREL) 100 MG tablet Take 1 tablet (100 mg total) by mouth at bedtime as  needed. 10/10/22     traZODone (DESYREL) 100 MG tablet Take 1 tablet (100 mg total) by mouth at bedtime as needed. 01/08/23     traZODone (DESYREL) 100 MG tablet Take 1 tablet (100 mg total) by mouth at bedtime as needed. 03/10/23     traZODone (DESYREL) 100 MG tablet Take 1 tablet (100 mg total) by mouth at bedtime as needed. 11/03/23     traZODone (DESYREL) 50 MG tablet Take 1 at bedtime as needed 05/28/22     triamcinolone cream (KENALOG) 0.1 % Apply 1 Application topically 2 (two) times daily. 12/09/22      triamcinolone cream (KENALOG) 0.1 % Apply topically 2 (two) times daily. 09/10/23     verapamil (CALAN-SR) 240 MG CR tablet Take 1 tablet (240 mg total) by mouth daily. 07/04/23     verapamil (CALAN-SR) 240 MG CR tablet Take 1 tablet (240 mg total) by mouth daily. 10/15/23     zolpidem (AMBIEN) 5 MG tablet Take 1 tablet (5 mg total) by mouth at bedtime as needed for sleep. 03/26/22   Corky Downs, MD    Family History Family History  Problem Relation Age of Onset   Liver cancer Father    Lung cancer Father    Breast cancer Maternal Aunt     Social History Social History   Tobacco Use   Smoking status: Never   Smokeless tobacco: Never  Vaping Use   Vaping status: Never Used  Substance Use Topics   Alcohol use: Yes    Comment: rarely   Drug use: Never     Allergies   Codeine, Penicillins, Prednisone, and Sulfa antibiotics   Review of Systems Review of Systems  Constitutional:  Negative for chills and fever.  HENT:  Positive for congestion, ear pain and postnasal drip. Negative for sore throat.   Respiratory:  Positive for cough. Negative for shortness of breath.      Physical Exam Triage Vital Signs ED Triage Vitals  Encounter Vitals Group     BP 12/24/23 1121 129/78     Systolic BP Percentile --      Diastolic BP Percentile --      Pulse Rate 12/24/23 1121 (!) 56     Resp 12/24/23 1121 18     Temp 12/24/23 1121 98.2 F (36.8 C)     Temp src --      SpO2 12/24/23 1121 95 %     Weight --      Height --      Head Circumference --      Peak Flow --      Pain Score 12/24/23 1117 6     Pain Loc --      Pain Education --      Exclude from Growth Chart --    No data found.  Updated Vital Signs BP 129/78   Pulse (!) 56   Temp 98.2 F (36.8 C)   Resp 18   SpO2 95%   Visual Acuity Right Eye Distance:   Left Eye Distance:   Bilateral Distance:    Right Eye Near:   Left Eye Near:    Bilateral Near:     Physical Exam Constitutional:      General:  She is not in acute distress. HENT:     Right Ear: Tympanic membrane is erythematous.     Left Ear: Tympanic membrane is erythematous.     Ears:     Comments: Both TMs mildly erythematous.  Nose: Congestion and rhinorrhea present.     Mouth/Throat:     Mouth: Mucous membranes are moist.     Pharynx: Oropharynx is clear.  Cardiovascular:     Rate and Rhythm: Normal rate and regular rhythm.     Heart sounds: Normal heart sounds.  Pulmonary:     Effort: Pulmonary effort is normal. No respiratory distress.     Breath sounds: Normal breath sounds.  Neurological:     Mental Status: She is alert.      UC Treatments / Results  Labs (all labs ordered are listed, but only abnormal results are displayed) Labs Reviewed - No data to display  EKG   Radiology No results found.  Procedures Procedures (including critical care time)  Medications Ordered in UC Medications - No data to display  Initial Impression / Assessment and Plan / UC Course  I have reviewed the triage vital signs and the nursing notes.  Pertinent labs & imaging results that were available during my care of the patient were reviewed by me and considered in my medical decision making (see chart for details).   Bilateral otitis media, acute sinusitis.  Treating with Augmentin.  Tylenol or ibuprofen as needed.  Plain Mucinex as needed.  Instructed patient to follow-up with her PCP if she is not improving.  She agrees to plan of care.    Final Clinical Impressions(s) / UC Diagnoses   Final diagnoses:  Bilateral otitis media, unspecified otitis media type  Acute non-recurrent maxillary sinusitis     Discharge Instructions      Take the Augmentin as directed.  Follow-up with your primary care provider if your symptoms are not improving.      ED Prescriptions     Medication Sig Dispense Auth. Provider   amoxicillin-clavulanate (AUGMENTIN) 875-125 MG tablet Take 1 tablet by mouth every 12 (twelve) hours.  14 tablet Mickie Bail, NP      PDMP not reviewed this encounter.   Mickie Bail, NP 12/24/23 1159

## 2023-12-25 ENCOUNTER — Other Ambulatory Visit: Payer: Self-pay

## 2023-12-30 ENCOUNTER — Other Ambulatory Visit: Payer: Self-pay

## 2023-12-30 MED ORDER — ESTRADIOL 0.1 MG/GM VA CREA
1.0000 | TOPICAL_CREAM | VAGINAL | 3 refills | Status: DC
Start: 2024-01-01 — End: 2024-03-05
  Filled 2023-12-30: qty 42.5, 90d supply, fill #0

## 2023-12-30 MED ORDER — TRAMADOL HCL 50 MG PO TABS
50.0000 mg | ORAL_TABLET | Freq: Two times a day (BID) | ORAL | 0 refills | Status: DC | PRN
  Filled 2023-12-30: qty 14, 7d supply, fill #0

## 2023-12-30 MED ORDER — KETOCONAZOLE 2 % EX CREA
TOPICAL_CREAM | Freq: Two times a day (BID) | CUTANEOUS | 2 refills | Status: DC
  Filled 2023-12-30: qty 30, 30d supply, fill #0
  Filled 2024-02-03: qty 30, 30d supply, fill #1
  Filled 2024-03-02: qty 30, 30d supply, fill #2

## 2023-12-31 ENCOUNTER — Other Ambulatory Visit: Payer: Self-pay

## 2024-01-01 ENCOUNTER — Other Ambulatory Visit: Payer: Self-pay

## 2024-01-01 MED ORDER — CEFDINIR 300 MG PO CAPS
300.0000 mg | ORAL_CAPSULE | Freq: Two times a day (BID) | ORAL | 0 refills | Status: AC
  Filled 2024-01-01: qty 20, 10d supply, fill #0

## 2024-01-13 LAB — HM DEXA SCAN

## 2024-02-03 ENCOUNTER — Other Ambulatory Visit: Payer: Self-pay

## 2024-02-04 ENCOUNTER — Other Ambulatory Visit: Payer: Self-pay

## 2024-02-04 MED ORDER — TRAZODONE HCL 100 MG PO TABS
100.0000 mg | ORAL_TABLET | Freq: Every evening | ORAL | 0 refills | Status: DC | PRN
  Filled 2024-02-04: qty 90, 90d supply, fill #0

## 2024-02-04 MED ORDER — QUETIAPINE FUMARATE 25 MG PO TABS
25.0000 mg | ORAL_TABLET | Freq: Every day | ORAL | 0 refills | Status: DC
  Filled 2024-02-04: qty 90, 90d supply, fill #0

## 2024-02-04 MED ORDER — DICLOFENAC SODIUM 75 MG PO TBEC
75.0000 mg | DELAYED_RELEASE_TABLET | Freq: Two times a day (BID) | ORAL | 1 refills | Status: DC
  Filled 2024-02-04: qty 60, 30d supply, fill #0
  Filled 2024-03-02: qty 60, 30d supply, fill #1

## 2024-02-04 MED ORDER — SERTRALINE HCL 100 MG PO TABS
200.0000 mg | ORAL_TABLET | Freq: Every day | ORAL | 0 refills | Status: DC
  Filled 2024-02-04 – 2024-10-12 (×3): qty 180, 90d supply, fill #0

## 2024-02-04 MED ORDER — LAMOTRIGINE 100 MG PO TABS
100.0000 mg | ORAL_TABLET | Freq: Every day | ORAL | 0 refills | Status: DC
Start: 2024-02-04 — End: 2024-06-27
  Filled 2024-02-04: qty 90, 90d supply, fill #0

## 2024-03-02 ENCOUNTER — Other Ambulatory Visit: Payer: Self-pay

## 2024-03-08 ENCOUNTER — Other Ambulatory Visit (HOSPITAL_COMMUNITY): Payer: Self-pay

## 2024-03-08 ENCOUNTER — Other Ambulatory Visit: Payer: Self-pay

## 2024-03-08 ENCOUNTER — Encounter: Payer: Self-pay | Admitting: Family Medicine

## 2024-03-08 ENCOUNTER — Telehealth: Payer: Self-pay

## 2024-03-08 ENCOUNTER — Ambulatory Visit: Admitting: Family Medicine

## 2024-03-08 VITALS — BP 122/70 | HR 85 | Resp 16 | Ht 60.0 in | Wt 187.0 lb

## 2024-03-08 DIAGNOSIS — Z13820 Encounter for screening for osteoporosis: Secondary | ICD-10-CM

## 2024-03-08 DIAGNOSIS — I10 Essential (primary) hypertension: Secondary | ICD-10-CM

## 2024-03-08 DIAGNOSIS — E1165 Type 2 diabetes mellitus with hyperglycemia: Secondary | ICD-10-CM

## 2024-03-08 DIAGNOSIS — Z23 Encounter for immunization: Secondary | ICD-10-CM | POA: Diagnosis not present

## 2024-03-08 DIAGNOSIS — E785 Hyperlipidemia, unspecified: Secondary | ICD-10-CM | POA: Diagnosis not present

## 2024-03-08 DIAGNOSIS — K7460 Unspecified cirrhosis of liver: Secondary | ICD-10-CM | POA: Diagnosis not present

## 2024-03-08 DIAGNOSIS — Z1211 Encounter for screening for malignant neoplasm of colon: Secondary | ICD-10-CM

## 2024-03-08 DIAGNOSIS — Z0000 Encounter for general adult medical examination without abnormal findings: Secondary | ICD-10-CM

## 2024-03-08 DIAGNOSIS — L4050 Arthropathic psoriasis, unspecified: Secondary | ICD-10-CM

## 2024-03-08 DIAGNOSIS — E66812 Obesity, class 2: Secondary | ICD-10-CM

## 2024-03-08 DIAGNOSIS — R011 Cardiac murmur, unspecified: Secondary | ICD-10-CM

## 2024-03-08 DIAGNOSIS — Z6836 Body mass index (BMI) 36.0-36.9, adult: Secondary | ICD-10-CM

## 2024-03-08 DIAGNOSIS — D649 Anemia, unspecified: Secondary | ICD-10-CM

## 2024-03-08 DIAGNOSIS — R5383 Other fatigue: Secondary | ICD-10-CM

## 2024-03-08 DIAGNOSIS — Z5181 Encounter for therapeutic drug level monitoring: Secondary | ICD-10-CM

## 2024-03-08 DIAGNOSIS — B372 Candidiasis of skin and nail: Secondary | ICD-10-CM

## 2024-03-08 MED ORDER — METFORMIN HCL ER 500 MG PO TB24
500.0000 mg | ORAL_TABLET | Freq: Two times a day (BID) | ORAL | 1 refills | Status: DC
Start: 2024-03-08 — End: 2024-07-30
  Filled 2024-03-08: qty 180, 90d supply, fill #0
  Filled 2024-06-11: qty 180, 90d supply, fill #1

## 2024-03-08 MED ORDER — ROSUVASTATIN CALCIUM 5 MG PO TABS
5.0000 mg | ORAL_TABLET | Freq: Every day | ORAL | 1 refills | Status: DC
  Filled 2024-03-08: qty 90, 90d supply, fill #0
  Filled 2024-06-11: qty 90, 90d supply, fill #1

## 2024-03-08 MED ORDER — HYDROCHLOROTHIAZIDE 12.5 MG PO TABS
12.5000 mg | ORAL_TABLET | Freq: Every day | ORAL | 1 refills | Status: AC
  Filled 2024-03-08 – 2024-05-14 (×2): qty 90, 90d supply, fill #0
  Filled 2024-07-30: qty 90, 90d supply, fill #1

## 2024-03-08 MED ORDER — LOSARTAN POTASSIUM 50 MG PO TABS
50.0000 mg | ORAL_TABLET | Freq: Every day | ORAL | 1 refills | Status: DC
  Filled 2024-03-08: qty 90, 90d supply, fill #0
  Filled 2024-06-11: qty 90, 90d supply, fill #1

## 2024-03-08 MED ORDER — CLOTRIMAZOLE-BETAMETHASONE 1-0.05 % EX CREA
1.0000 | TOPICAL_CREAM | Freq: Every day | CUTANEOUS | 3 refills | Status: AC | PRN
  Filled 2024-03-08: qty 45, 45d supply, fill #0
  Filled 2024-05-14: qty 45, 45d supply, fill #1
  Filled 2024-07-30: qty 45, 45d supply, fill #2
  Filled 2024-10-05: qty 45, 45d supply, fill #3

## 2024-03-08 MED ORDER — OZEMPIC (1 MG/DOSE) 4 MG/3ML ~~LOC~~ SOPN
1.0000 mg | PEN_INJECTOR | SUBCUTANEOUS | 1 refills | Status: DC
Start: 2024-03-08 — End: 2024-07-30
  Filled 2024-03-08 – 2024-03-09 (×2): qty 9, 84d supply, fill #0
  Filled 2024-05-14: qty 9, 84d supply, fill #1

## 2024-03-08 MED ORDER — VERAPAMIL HCL ER 240 MG PO TBCR
240.0000 mg | EXTENDED_RELEASE_TABLET | Freq: Every day | ORAL | 1 refills | Status: DC
  Filled 2024-03-08: qty 90, 90d supply, fill #0
  Filled 2024-05-14: qty 90, 90d supply, fill #1

## 2024-03-08 MED ORDER — FLUCONAZOLE 150 MG PO TABS
150.0000 mg | ORAL_TABLET | ORAL | 0 refills | Status: DC | PRN
  Filled 2024-03-08: qty 2, 6d supply, fill #0

## 2024-03-08 NOTE — Assessment & Plan Note (Signed)
 Per [facility] rheumatology - reviewed most recent OV notes

## 2024-03-08 NOTE — Progress Notes (Signed)
 Name: April Singleton   MRN: [id number]    DOB: March 09, 1951   Date:03/08/2024       Progress Note  Chief Complaint  Patient presents with   Establish Care    She states she has been really weak lately   Diabetes   Hypertension     Subjective:   April Singleton is a 73 y.o. female, presents to clinic to establish care She was previously seeing Dr. Jack Marts, he retired Records in EMR are from 2023, no more recent PCP records since then  Reviewed med dispense list and he was managing HRT estradiol , lotrisone  cream, HTN and cardiac meds HCTZ, losartan  verapamil , DM on ozempic  and metformin , HLD on crestor  and then PPI for GERD  Chart states she has hx of psoriatic arthritis, morbid obesity, cirrhosis      Current Outpatient Medications:    acetaminophen  (TYLENOL ) 500 MG tablet, Take 1,000 mg by mouth every 6 (six) hours as needed., Disp: , Rfl:    Blood Glucose Monitoring Suppl (BLOOD GLUCOSE MONITOR SYSTEM) w/Device KIT, Use as directed, Disp: 1 kit, Rfl: 0   clotrimazole -betamethasone  (LOTRISONE ) cream, Apply 1 Application topically daily as needed., Disp: 45 g, Rfl: 3   diclofenac  (VOLTAREN ) 75 MG EC tablet, Take 1 tablet (75 mg total) by mouth 2 (two) times daily with a meal., Disp: 60 tablet, Rfl: 1   estradiol  (ESTRACE ) 1 MG tablet, Take 1 tablet (1 mg total) by mouth daily., Disp: 90 tablet, Rfl: 1   gabapentin  (NEURONTIN ) 100 MG capsule, Take 1 capsule (100 mg total) by mouth 2 (two) times daily. If no improvement after 5 days increase to 3 times daily, Disp: 60 capsule, Rfl: 11   glucose blood test strip, Use as directed., Disp: 100 each, Rfl: 0   hydrochlorothiazide  (HYDRODIURIL ) 12.5 MG tablet, Take 1 tablet (12.5 mg total) by mouth daily., Disp: 90 tablet, Rfl: 3   ipratropium (ATROVENT ) 0.06 % nasal spray, Place 2 sprays into both nostrils 3 (three) times daily as needed for Rhinitis, Disp: 15 mL, Rfl: 0   ketoconazole  (NIZORAL ) 2 % cream, Apply topically 2 (two) times  daily To affected areas, use for up to 3-4 weeks, Disp: 30 g, Rfl: 2   lamoTRIgine  (LAMICTAL ) 100 MG tablet, Take 1 tablet (100 mg total) by mouth daily. (Patient taking differently: Take 50 mg by mouth daily.), Disp: 90 tablet, Rfl: 0   Lifitegrast  (XIIDRA ) 5 % SOLN, Place 1 drop into affected eyes 2 (two) times daily., Disp: 180 each, Rfl: 4   losartan  (COZAAR ) 50 MG tablet, Take 1 tablet (50 mg total) by mouth daily., Disp: 90 tablet, Rfl: 3   metFORMIN  (GLUCOPHAGE -XR) 500 MG 24 hr tablet, Take 1 tablet (500 mg total) by mouth 2 (two) times daily., Disp: 180 tablet, Rfl: 3   ondansetron  (ZOFRAN -ODT) 4 MG disintegrating tablet, Take 1 tablet (4 mg total) by mouth every 8 (eight) hours as needed for nausea or vomiting., Disp: 20 tablet, Rfl: 0   OneTouch UltraSoft 2 Lancets MISC, Use as directed., Disp: 100 each, Rfl: 0   pantoprazole  (PROTONIX ) 40 MG tablet, Take 1 tablet (40 mg total) by mouth daily., Disp: 90 tablet, Rfl: 3   QUEtiapine  (SEROQUEL ) 50 MG tablet, Take 1 tablet (50 mg total) by mouth at bedtime., Disp: 90 tablet, Rfl: 0   rosuvastatin  (CRESTOR ) 10 MG tablet, Take 1 tablet (10 mg total) by mouth daily., Disp: 90 tablet, Rfl: 1   Semaglutide , 1 MG/DOSE, (OZEMPIC , 1 MG/DOSE,) 4  MG/3ML SOPN, Inject 1 mg into the skin once a week., Disp: 30 mL, Rfl: 4   sertraline  (ZOLOFT ) 100 MG tablet, Take 2 tablets (200 mg total) by mouth daily with breakfast., Disp: 180 tablet, Rfl: 0   SKYRIZI PEN 150 MG/ML pen, Inject 150 mg into the skin. Unsure of dosing frequency, Disp: , Rfl:    tiZANidine  (ZANAFLEX ) 2 MG tablet, Take 1 tablet (2 mg total) by mouth every 8 (eight) hours as needed for Muscle spasms, Disp: 30 tablet, Rfl: 0   traZODone  (DESYREL ) 100 MG tablet, Take 1 tablet (100 mg total) by mouth at bedtime as needed., Disp: 90 tablet, Rfl: 0   triamcinolone  cream (KENALOG ) 0.1 %, Apply topically 2 (two) times daily., Disp: 30 g, Rfl: 1   verapamil  (CALAN -SR) 240 MG CR tablet, Take 1 tablet  (240 mg total) by mouth daily., Disp: 90 tablet, Rfl: 1  Patient Active Problem List   Diagnosis Date Noted   Psoriatic arthritis (HCC) 02/20/2023   Total knee replacement status 04/29/2022   Pain of right hip 04/05/2022   Arthritis of right hip 04/05/2022   Trochanteric bursitis of right hip 04/05/2022   Type 2 diabetes mellitus without complications (HCC) 04/01/2022   Urinary frequency 03/26/2022   Primary osteoarthritis of left knee 02/10/2022   Panniculitis 06/12/2021   Back pain 06/12/2021   Primary osteoarthritis of right knee 10/26/2020   Dyslipidemia 07/24/2020   Arthritis 07/24/2020   Cirrhosis of liver without ascites (HCC) 07/24/2020   Rotator cuff arthropathy of left shoulder 03/27/2020   Class 2 obesity due to excess calories without serious comorbidity with body mass index (BMI) of 39.0 to 39.9 in adult 03/27/2020   Annual physical exam 03/27/2020   Essential hypertension 03/27/2020   Type 2 diabetes mellitus with hyperglycemia, without long-term current use of insulin  (HCC) 03/15/2020   Burning with urination 03/15/2020   Obesity (BMI 35.0-39.9 without comorbidity) 03/15/2020   Chronic fatigue 03/25/2018   Chronic foot pain, right 06/10/2016   Hyperlipidemia 05/21/2016   Pain in unspecified joint 05/21/2016   Right upper quadrant pain 06/29/2014    Past Surgical History:  Procedure Laterality Date   ABDOMINAL HYSTERECTOMY     BREAST BIOPSY     Pt unsure of type of biopsy or even of which side   KNEE ARTHROPLASTY Left 04/29/2022   Procedure: COMPUTER ASSISTED TOTAL KNEE ARTHROPLASTY;  Surgeon: Arlyne Lame, MD;  Location: [facility] ORS;  Service: Orthopedics;  Laterality: Left;   REDUCTION MAMMAPLASTY     TONSILLECTOMY      Family History  Problem Relation Age of Onset   Lung cancer Mother    Hypertension Mother    Congestive Heart Failure Mother    Liver cancer Father    Lung cancer Father    Breast cancer Maternal Aunt    Breast cancer Maternal Aunt      Social History   Tobacco Use   Smoking status: Never   Smokeless tobacco: Never  Vaping Use   Vaping status: Never Used  Substance Use Topics   Alcohol use: Not Currently    Comment: rarely   Drug use: Never     Allergies  Allergen Reactions   Codeine Other (See Comments)    Nausea and vomiting   Penicillins     IgE = 95 (WNL) on 04/18/2022   Prednisone  Other (See Comments)    Makes her violent , patient reports only in high doses   Sulfa Antibiotics Hives  Health Maintenance  Topic Date Due   FOOT EXAM  Never done   Diabetic kidney evaluation - Urine ACR  03/17/2019   Medicare Annual Wellness (AWV)  10/12/2022   OPHTHALMOLOGY EXAM  12/05/2022   HEMOGLOBIN A1C  03/13/2023   Diabetic kidney evaluation - eGFR measurement  09/12/2023   COVID-19 Vaccine (4 - 2024-25 season) 03/21/2024 (Originally 06/01/2023)   Colonoscopy  03/08/2025 (Originally 09/11/2022)   INFLUENZA VACCINE  04/30/2024   MAMMOGRAM  09/04/2025   DTaP/Tdap/Td (2 - Td or Tdap) 08/14/2031   Pneumonia Vaccine 2+ Years old  Completed   DEXA SCAN  Completed   Hepatitis C Screening  Completed   Zoster Vaccines- Shingrix  Completed   HPV VACCINES  Aged Out   Meningococcal B Vaccine  Aged Out    Chart Review Today: I personally reviewed active problem list, medication list, allergies, family history, social history, health maintenance, notes from last encounter, lab results, imaging with the patient/caregiver today.   Review of Systems  Constitutional:  Positive for fatigue. Negative for activity change, appetite change, chills, diaphoresis, fever and unexpected weight change.  HENT: Negative.    Eyes: Negative.   Respiratory: Negative.  Negative for shortness of breath.   Cardiovascular:  Positive for palpitations. Negative for chest pain and leg swelling.  Gastrointestinal: Negative.   Endocrine: Negative.   Genitourinary: Negative.   Musculoskeletal: Negative.   Skin: Negative.    Allergic/Immunologic: Negative.   Neurological: Negative.   Hematological: Negative.   Psychiatric/Behavioral: Negative.    All other systems reviewed and are negative.    Objective:   Vitals:   03/08/24 1316  BP: 122/70  Pulse: 85  Resp: 16  SpO2: 96%  Weight: 187 lb (84.8 kg)  Height: 5' (1.524 m)    Body mass index is 36.52 kg/m.  Physical Exam Vitals and nursing note reviewed.  Constitutional:      General: She is not in acute distress.    Appearance: Normal appearance. She is well-developed. She is obese. She is not ill-appearing, toxic-appearing or diaphoretic.  HENT:     Head: Normocephalic and atraumatic.     Right Ear: External ear normal.     Left Ear: External ear normal.     Nose: Nose normal.  Eyes:     General: No scleral icterus.       Right eye: No discharge.        Left eye: No discharge.     Conjunctiva/sclera: Conjunctivae normal.  Neck:     Trachea: No tracheal deviation.  Cardiovascular:     Rate and Rhythm: Normal rate and regular rhythm.     Heart sounds: Murmur heard.     No gallop.  Pulmonary:     Effort: Pulmonary effort is normal. No respiratory distress.     Breath sounds: Normal breath sounds. No stridor. No wheezing, rhonchi or rales.  Skin:    General: Skin is warm and dry.     Findings: No rash.  Neurological:     Mental Status: She is alert.     Motor: No abnormal muscle tone.     Coordination: Coordination normal.     Gait: Gait abnormal.  Psychiatric:        Mood and Affect: Mood normal.        Behavior: Behavior normal.      Functional Status Survey: Is the patient deaf or have difficulty hearing?: Yes Does the patient have difficulty seeing, even when wearing glasses/contacts?: No Does the  patient have difficulty concentrating, remembering, or making decisions?: No Does the patient have difficulty walking or climbing stairs?: Yes Does the patient have difficulty dressing or bathing?: No Does the patient have  difficulty doing errands alone such as visiting a doctor's office or shopping?: No  Results for orders placed or performed during the hospital encounter of 07/07/23  POCT urinalysis dipstick   Collection Time: 07/07/23  2:51 PM  Result Value Ref Range   Color, UA yellow yellow   Clarity, UA clear clear   Glucose, UA negative negative mg/dL   Bilirubin, UA negative negative   Ketones, POC UA negative negative mg/dL   Spec Grav, UA 6.387 5.643 - 1.025   Blood, UA negative negative   pH, UA 5.5 5.0 - 8.0   Protein Ur, POC negative negative mg/dL   Urobilinogen, UA 0.2 0.2 or 1.0 E.U./dL   Nitrite, UA Negative Negative   Leukocytes, UA Negative Negative  Urine Culture   Collection Time: 07/07/23  3:02 PM   Specimen: Urine, Clean Catch  Result Value Ref Range   Specimen Description URINE, CLEAN CATCH    Special Requests NONE    Culture (A)     <10,000 COLONIES/mL INSIGNIFICANT GROWTH Performed at [facility], [address], [place], [place] [address]    Report Status 07/09/2023 FINAL   Cervicovaginal ancillary only   Collection Time: 07/07/23  3:02 PM  Result Value Ref Range   Bacterial Vaginitis (gardnerella) Negative    Candida Vaginitis Negative    Candida Glabrata Negative    Comment      Normal Reference Range Bacterial Vaginosis - Negative   Comment Normal Reference Range Candida Species - Negative    Comment Normal Reference Range Candida Galbrata - Negative       Assessment & Plan:   Problem List Items Addressed This Visit     Type 2 diabetes mellitus with hyperglycemia, without long-term current use of insulin  (HCC)   She believes her last A1c was close to 7.0% Managed on XR metformin  500 mg BID and regular metformin  cause GI upset Also on ozempic  1 mg dose weekly, needs refills Labs done today - will need to do DM foot exam and get records requested She is on ARB and statin       Relevant Medications   Semaglutide , 1 MG/DOSE, (OZEMPIC , 1 MG/DOSE,)  4 MG/3ML SOPN   losartan  (COZAAR ) 50 MG tablet   metFORMIN  (GLUCOPHAGE -XR) 500 MG 24 hr tablet   rosuvastatin  (CRESTOR ) 5 MG tablet   Other Relevant Orders   HgB A1c   Comprehensive Metabolic Panel (CMET)   Urine Microalbumin w/creat. ratio   Class 2 severe obesity with serious comorbidity and body mass index (BMI) of 36.0 to 36.9 in adult [facility])   Multiple associated cormorbidities including DM, HLD, HTN, OA and psoriatic disease      Relevant Medications   Semaglutide , 1 MG/DOSE, (OZEMPIC , 1 MG/DOSE,) 4 MG/3ML SOPN   metFORMIN  (GLUCOPHAGE -XR) 500 MG 24 hr tablet   Other Relevant Orders   HgB A1c   Comprehensive Metabolic Panel (CMET)   CBC with Differential/Platelet   Lipid panel   Essential hypertension   BP well controlled and at goal today on her current meds per last PCP  HCTZ 12.5 mg dose, losartan  50 (could consider combo pill?) and verapamil  240 BP Readings from Last 3 Encounters:  03/08/24 122/70  12/24/23 129/78  07/07/23 (!) 145/68        Relevant Medications  hydrochlorothiazide  (HYDRODIURIL ) 12.5 MG tablet   losartan  (COZAAR ) 50 MG tablet   rosuvastatin  (CRESTOR ) 5 MG tablet   verapamil  (CALAN -SR) 240 MG CR tablet   Other Relevant Orders   Comprehensive Metabolic Panel (CMET)   Dyslipidemia   Pt managed on crestor  10 mg daily she believes it hurts her legs more than her baseline pain, she also endorses claudication type symptoms (unclear if from med SE, neurogenic/MSK, or vascular) Will try reduced crestor  dose and see if leg sx improve at all, getting LP today, none recent available in chart      Relevant Medications   rosuvastatin  (CRESTOR ) 5 MG tablet   Other Relevant Orders   Comprehensive Metabolic Panel (CMET)   Lipid panel   Cirrhosis of liver without ascites (HCC)   She was seeing [facility] but got lost to f/up in the last 2 years Will refer her back to reconnect      Relevant Orders   Ambulatory referral to Gastroenterology   Psoriatic  arthritis [facility])   Per [facility] rheumatology - reviewed most recent OV notes      Relevant Medications   SKYRIZI PEN 150 MG/ML pen   Other Relevant Orders   Comprehensive Metabolic Panel (CMET)   CBC with Differential/Platelet   Other Visit Diagnoses       Encounter for medical examination to establish care    -  Primary   no records for the past 2 years available in system - will request from Dr. Genie Key office     Immunization due       Relevant Orders   Pneumococcal conjugate vaccine 20-valent (Prevnar 20) (Completed)     Encounter for medication monitoring       Relevant Medications   Semaglutide , 1 MG/DOSE, (OZEMPIC , 1 MG/DOSE,) 4 MG/3ML SOPN   Other Relevant Orders   HgB A1c   Comprehensive Metabolic Panel (CMET)   Urine Microalbumin w/creat. ratio   CBC with Differential/Platelet   Lipid panel     Anemia, unspecified type       low h/h since surgery in 2023   Relevant Orders   CBC with Differential/Platelet     Murmur       pt's prior PCP did not work up murmur so it may be new - sounds like AS, will get ECHO or refer to cardiology   Relevant Orders   Ambulatory referral to Cardiology     Fatigue, unspecified type       hx of anemia, psoriatic arthritis, will recheck basic labs   Relevant Orders   CBC with Differential/Platelet     Candidal intertrigo       recurrent to groin and breast skin folds   Relevant Medications   clotrimazole -betamethasone  (LOTRISONE ) cream   fluconazole  (DIFLUCAN ) 150 MG tablet     Pt tolerated diflucan  in the past and has needed intermittently for intertrigo flares -  Reviewed the interaction with pt, and last ECG in system (2023 preop ECG showed no Qtc prolongation)    Return for 1 month for acute issues and review labs.   Adeline Hone, PA-C 03/08/24 1:42 PM

## 2024-03-08 NOTE — Patient Instructions (Addendum)
 Try the reduced crestor  dose and see if it helps reduce your leg pain symptoms.  It may not be related to your statin medication  New murmur - I will set up some follow up on this with either an ECHO or cardiology consult.

## 2024-03-08 NOTE — Assessment & Plan Note (Signed)
 BP well controlled and at goal today on her current meds per last PCP  HCTZ 12.5 mg dose, losartan  50 (could consider combo pill?) and verapamil  240 BP Readings from Last 3 Encounters:  03/08/24 122/70  12/24/23 129/78  07/07/23 (!) 145/68

## 2024-03-08 NOTE — Telephone Encounter (Signed)
 Pharmacy Patient Advocate Encounter   Received notification from Onbase that prior authorization for Ozempic  (1 MG/DOSE) 4MG /3ML pen-injectors  is required/requested.   Insurance verification completed.   The patient is insured through [facility] ADVANTAGE/RX ADVANCE .   Per test claim: PA required; PA submitted to above mentioned insurance via CoverMyMeds Key/confirmation #/EOC [id number] Status is pending

## 2024-03-08 NOTE — Assessment & Plan Note (Signed)
 Multiple associated cormorbidities including DM, HLD, HTN, OA and psoriatic disease

## 2024-03-08 NOTE — Assessment & Plan Note (Signed)
 Pt managed on crestor  10 mg daily she believes it hurts her legs more than her baseline pain, she also endorses claudication type symptoms (unclear if from med SE, neurogenic/MSK, or vascular) Will try reduced crestor  dose and see if leg sx improve at all, getting LP today, none recent available in chart

## 2024-03-08 NOTE — Assessment & Plan Note (Signed)
 She believes her last A1c was close to 7.0% Managed on XR metformin  500 mg BID and regular metformin  cause GI upset Also on ozempic  1 mg dose weekly, needs refills Labs done today - will need to do DM foot exam and get records requested She is on ARB and statin

## 2024-03-08 NOTE — Assessment & Plan Note (Signed)
 She was seeing [facility] but got lost to f/up in the last 2 years Will refer her back to reconnect

## 2024-03-09 ENCOUNTER — Encounter: Payer: Self-pay | Admitting: Family Medicine

## 2024-03-09 ENCOUNTER — Other Ambulatory Visit (HOSPITAL_COMMUNITY): Payer: Self-pay

## 2024-03-09 ENCOUNTER — Other Ambulatory Visit: Payer: Self-pay

## 2024-03-09 ENCOUNTER — Ambulatory Visit: Payer: Self-pay | Admitting: Family Medicine

## 2024-03-09 DIAGNOSIS — D649 Anemia, unspecified: Secondary | ICD-10-CM

## 2024-03-09 DIAGNOSIS — R5383 Other fatigue: Secondary | ICD-10-CM

## 2024-03-09 DIAGNOSIS — D509 Iron deficiency anemia, unspecified: Secondary | ICD-10-CM

## 2024-03-09 DIAGNOSIS — R718 Other abnormality of red blood cells: Secondary | ICD-10-CM

## 2024-03-09 LAB — CBC WITH DIFFERENTIAL/PLATELET
Absolute Lymphocytes: 2042 {cells}/uL (ref 850–3900)
Absolute Monocytes: 733 {cells}/uL (ref 200–950)
Basophils Absolute: 52 {cells}/uL (ref 0–200)
Basophils Relative: 0.7 %
Eosinophils Absolute: 200 {cells}/uL (ref 15–500)
Eosinophils Relative: 2.7 %
HCT: 33.8 % — ABNORMAL LOW (ref 35.0–45.0)
Hemoglobin: 10.2 g/dL — ABNORMAL LOW (ref 11.7–15.5)
MCH: 23 pg — ABNORMAL LOW (ref 27.0–33.0)
MCHC: 30.2 g/dL — ABNORMAL LOW (ref 32.0–36.0)
MCV: 76.1 fL — ABNORMAL LOW (ref 80.0–100.0)
MPV: 10.3 fL (ref 7.5–12.5)
Monocytes Relative: 9.9 %
Neutro Abs: 4373 {cells}/uL (ref 1500–7800)
Neutrophils Relative %: 59.1 %
Platelets: 277 10*3/uL (ref 140–400)
RBC: 4.44 10*6/uL (ref 3.80–5.10)
RDW: 15 % (ref 11.0–15.0)
Total Lymphocyte: 27.6 %
WBC: 7.4 10*3/uL (ref 3.8–10.8)

## 2024-03-09 LAB — COMPREHENSIVE METABOLIC PANEL WITH GFR
AG Ratio: 1.4 (calc) (ref 1.0–2.5)
ALT: 20 U/L (ref 6–29)
AST: 22 U/L (ref 10–35)
Albumin: 4.1 g/dL (ref 3.6–5.1)
Alkaline phosphatase (APISO): 66 U/L (ref 37–153)
BUN: 17 mg/dL (ref 7–25)
CO2: 24 mmol/L (ref 20–32)
Calcium: 9.4 mg/dL (ref 8.6–10.4)
Chloride: 104 mmol/L (ref 98–110)
Creat: 0.81 mg/dL (ref 0.60–1.00)
Globulin: 3 g/dL (ref 1.9–3.7)
Glucose, Bld: 105 mg/dL — ABNORMAL HIGH (ref 65–99)
Potassium: 4.5 mmol/L (ref 3.5–5.3)
Sodium: 139 mmol/L (ref 135–146)
Total Bilirubin: 0.3 mg/dL (ref 0.2–1.2)
Total Protein: 7.1 g/dL (ref 6.1–8.1)
eGFR: 78 mL/min/{1.73_m2} (ref >=60)

## 2024-03-09 LAB — HEMOGLOBIN A1C
Hgb A1c MFr Bld: 6.1 % — ABNORMAL HIGH (ref <5.7)
Mean Plasma Glucose: 128 mg/dL
eAG (mmol/L): 7.1 mmol/L

## 2024-03-09 LAB — LIPID PANEL
Cholesterol: 193 mg/dL (ref <200)
HDL: 88 mg/dL (ref >=50)
LDL Cholesterol (Calc): 74 mg/dL
Non-HDL Cholesterol (Calc): 105 mg/dL (ref <130)
Total CHOL/HDL Ratio: 2.2 (calc) (ref <5.0)
Triglycerides: 211 mg/dL — ABNORMAL HIGH (ref <150)

## 2024-03-09 LAB — MICROALBUMIN / CREATININE URINE RATIO
Creatinine, Urine: 190 mg/dL (ref 20–275)
Microalb Creat Ratio: 4 mg/g{creat} (ref <30)
Microalb, Ur: 0.7 mg/dL

## 2024-03-09 NOTE — Addendum Note (Signed)
 Addended by: Mathayus Stanbery on: 03/09/2024 05:35 PM   Modules accepted: Orders

## 2024-03-09 NOTE — Telephone Encounter (Signed)
 Pharmacy Patient Advocate Encounter  Received notification from [employer] that Prior Authorization for Ozempic  (1 MG/DOSE) 4MG /3ML pen-injectors has been APPROVED from 03/08/24 to 03/08/25. Ran test claim, Copay is $0.00. This test claim was processed through [facility]- copay amounts may vary at other pharmacies due to pharmacy/plan contracts, or as the patient moves through the different stages of their insurance plan.   PA #/Case ID/Reference #: [id number]

## 2024-03-10 ENCOUNTER — Other Ambulatory Visit: Payer: Self-pay

## 2024-03-10 DIAGNOSIS — D649 Anemia, unspecified: Secondary | ICD-10-CM

## 2024-03-11 ENCOUNTER — Ambulatory Visit: Payer: Self-pay

## 2024-03-11 NOTE — Telephone Encounter (Signed)
 FYI Only or Action Required?: FYI only for provider  Patient was last seen in primary care on 09/11/2022 by Theron Flavin, MD. Called Nurse Triage reporting Urinary Symptoms. Symptoms began several days ago. Interventions attempted: Nothing. Symptoms are: gradually worsening.  Triage Disposition: No disposition on file.-- Please see note.   Patient/caregiver understands and will follow disposition?:  Yes      Copied from CRM [id number]. Topic: Clinical - Red Word Triage >> Mar 11, 2024  3:21 PM April Singleton wrote: Red Word that prompted transfer to Nurse Triage: Patient is calling she is stating that she got pneumonia shot and is feeling bad ever since and she has nasuea and diahreah for 2-3 days and has low grade fever extreme latherga. Answer Assessment - Initial Assessment Questions Patient was already triaged this morning for the same symptoms ( see previous triaged note at 1130am)  Patient called back to relay her symptoms are worsening Reports  I'm just feeling sicker, weaker and I don't think I can wait until my appointment tomorrow.   Patient was referred to local UC/ED. Patient confirms there is a local UC she can go to. Pt verbalized understanding. No additional questions/concerns noted during the time of the call.  Protocols used: Urinary Symptoms-A-AH

## 2024-03-11 NOTE — Telephone Encounter (Signed)
 FYI Only or Action Required?: Action required by provider  Patient was last seen in primary care on 09/11/2022 by Theron Flavin, MD. Called Nurse Triage reporting Dysuria. Symptoms began several days ago. Interventions attempted: Nothing. Symptoms are: gradually worsening.  Triage Disposition: See Physician Within 24 Hours  Patient/caregiver understands and will follow disposition?: Yes  Would like an antibiotic called in for her.  States was seen last week and discussed urinary issues.  Would like a call back from the office.   Copied from CRM [id number]. Topic: Clinical - Red Word Triage >> Mar 11, 2024 11:25 AM Precious C wrote: [facility] that prompted transfer to Nurse Triage: just saw doc for shot and experiencing new of UTI Reason for Disposition  Age > 50 years  Answer Assessment - Initial Assessment Questions 1. SEVERITY: How bad is the pain?  (e.g., Scale 1-10; mild, moderate, or severe)   - MILD (1-3): complains slightly about urination hurting   - MODERATE (4-7): interferes with normal activities     - SEVERE (8-10): excruciating, unwilling or unable to urinate because of the pain      mild 2. FREQUENCY: How many times have you had painful urination today?      Yes, every time 3. PATTERN: Is pain present every time you urinate or just sometimes?      Every time 4. ONSET: When did the painful urination start?      Three days ago 5. FEVER: Do you have a fever? If Yes, ask: What is your temperature, how was it measured, and when did it start?     Yes, 100 6. PAST UTI: Have you had a urine infection before? If Yes, ask: When was the last time? and What happened that time?      yes 7. CAUSE: What do you think is causing the painful urination?  (e.g., UTI, scratch, Herpes sore)     UTI 8. OTHER SYMPTOMS: Do you have any other symptoms? (e.g., blood in urine, flank pain, genital sores, urgency, vaginal discharge)     Received the pneumonia shot in the office  recently, foul odor and back pain 9. PREGNANCY: Is there any chance you are pregnant? When was your last menstrual period?     na  Protocols used: Urination Pain - Female-A-AH

## 2024-03-11 NOTE — Telephone Encounter (Signed)
 FYI

## 2024-03-11 NOTE — Telephone Encounter (Signed)
 Lvm for pt to return call to schedule appt with her pcp

## 2024-03-11 NOTE — Telephone Encounter (Signed)
 Patient need appointment with her provider

## 2024-03-12 ENCOUNTER — Ambulatory Visit: Admitting: Family Medicine

## 2024-03-12 DIAGNOSIS — R300 Dysuria: Secondary | ICD-10-CM

## 2024-03-17 ENCOUNTER — Inpatient Hospital Stay: Attending: Oncology | Admitting: Oncology

## 2024-03-17 ENCOUNTER — Inpatient Hospital Stay

## 2024-03-17 ENCOUNTER — Encounter: Payer: Self-pay | Admitting: Oncology

## 2024-03-17 VITALS — BP 126/71 | HR 58 | Temp 97.0°F | Resp 15 | Ht 60.0 in | Wt 188.0 lb

## 2024-03-17 DIAGNOSIS — D509 Iron deficiency anemia, unspecified: Secondary | ICD-10-CM | POA: Diagnosis present

## 2024-03-17 NOTE — Progress Notes (Signed)
 [facility]  [phone]) [phone] [phone]  ID: CHARLAINE UTSEY OB: [birth year]  MR#: [id number]  AOZ#:[id number]  Patient Care Team: Adeline Hone, PA-C as PCP - General (Family Medicine)  CHIEF COMPLAINT: Iron deficiency anemia.  INTERVAL HISTORY: Patient is a 73 year old female who was noted to have significantly reduced hemoglobin and iron stores on routine blood work.  She has increased weakness and fatigue, but otherwise feels well.  She has no neurologic complaints.  She denies any recent fevers or illnesses.  She has a good appetite and denies weight loss.  She has no chest pain, shortness of breath, cough, or hemoptysis.  She denies any nausea, vomiting, constipation, or diarrhea.  She has no melena or hematochezia.  She has no urinary complaints.  Patient offers no further specific complaints today.  REVIEW OF SYSTEMS:   Review of Systems  Constitutional: Negative.  Negative for fever, malaise/fatigue and weight loss.  Respiratory: Negative.  Negative for cough, hemoptysis and shortness of breath.   Cardiovascular: Negative.  Negative for chest pain and leg swelling.  Gastrointestinal: Negative.  Negative for abdominal pain, blood in stool and melena.  Genitourinary: Negative.  Negative for hematuria.  Musculoskeletal: Negative.  Negative for back pain.  Skin: Negative.  Negative for rash.  Neurological:  Positive for weakness. Negative for dizziness, focal weakness and headaches.  Psychiatric/Behavioral: Negative.  The patient is not nervous/anxious.     As per HPI. Otherwise, a complete review of systems is negative.  PAST MEDICAL HISTORY: Past Medical History:  Diagnosis Date   Anemia    Arthritis    Asthma    Chronic kidney disease    Depression    Diabetes mellitus without complication (HCC)    GERD (gastroesophageal reflux disease)    History of kidney stones    Hypertension    Pneumonia    PONV (postoperative nausea and vomiting)      PAST SURGICAL HISTORY: Past Surgical History:  Procedure Laterality Date   ABDOMINAL HYSTERECTOMY     BREAST BIOPSY     Pt unsure of type of biopsy or even of which side   KNEE ARTHROPLASTY Left 04/29/2022   Procedure: COMPUTER ASSISTED TOTAL KNEE ARTHROPLASTY;  Surgeon: Arlyne Lame, MD;  Location: [facility] ORS;  Service: Orthopedics;  Laterality: Left;   REDUCTION MAMMAPLASTY     TONSILLECTOMY      FAMILY HISTORY: Family History  Problem Relation Age of Onset   Lung cancer Mother    Hypertension Mother    Congestive Heart Failure Mother    Liver cancer Father    Lung cancer Father    Breast cancer Maternal Aunt    Breast cancer Maternal Aunt     ADVANCED DIRECTIVES (Y/N):  N  HEALTH MAINTENANCE: Social History   Tobacco Use   Smoking status: Never   Smokeless tobacco: Never  Vaping Use   Vaping status: Never Used  Substance Use Topics   Alcohol use: Not Currently    Comment: rarely   Drug use: Never     Colonoscopy:  PAP:  Bone density:  Lipid panel:  Allergies  Allergen Reactions   Codeine Other (See Comments)    Nausea and vomiting   Penicillins     IgE = 95 (WNL) on 04/18/2022   Prednisone  Other (See Comments)    Makes her violent , patient reports only in high doses   Sulfa Antibiotics Hives    Current Outpatient Medications  Medication Sig Dispense Refill   acetaminophen  (  TYLENOL ) 500 MG tablet Take 1,000 mg by mouth every 6 (six) hours as needed.     Blood Glucose Monitoring Suppl (BLOOD GLUCOSE MONITOR SYSTEM) w/Device KIT Use as directed 1 kit 0   clotrimazole -betamethasone  (LOTRISONE ) cream Apply 1 Application topically daily as needed. 45 g 3   diclofenac  (VOLTAREN ) 75 MG EC tablet Take 1 tablet (75 mg total) by mouth 2 (two) times daily with a meal. 60 tablet 1   estradiol  (ESTRACE ) 1 MG tablet Take 1 tablet (1 mg total) by mouth daily. 90 tablet 1   fluconazole  (DIFLUCAN ) 150 MG tablet Take 1 tablet (150 mg total) by mouth every 3  (three) days as needed (for vaginal itching/yeast infection sx). 2 tablet 0   gabapentin  (NEURONTIN ) 100 MG capsule Take 1 capsule (100 mg total) by mouth 2 (two) times daily. If no improvement after 5 days increase to 3 times daily 60 capsule 11   glucose blood test strip Use as directed. 100 each 0   hydrochlorothiazide  (HYDRODIURIL ) 12.5 MG tablet Take 1 tablet (12.5 mg total) by mouth daily. 90 tablet 1   ipratropium (ATROVENT ) 0.06 % nasal spray Place 2 sprays into both nostrils 3 (three) times daily as needed for Rhinitis 15 mL 0   ketoconazole  (NIZORAL ) 2 % cream Apply topically 2 (two) times daily To affected areas, use for up to 3-4 weeks 30 g 2   lamoTRIgine  (LAMICTAL ) 100 MG tablet Take 1 tablet (100 mg total) by mouth daily. (Patient taking differently: Take 50 mg by mouth daily.) 90 tablet 0   Lifitegrast  (XIIDRA ) 5 % SOLN Place 1 drop into affected eyes 2 (two) times daily. 180 each 4   losartan  (COZAAR ) 50 MG tablet Take 1 tablet (50 mg total) by mouth daily. 90 tablet 1   metFORMIN  (GLUCOPHAGE -XR) 500 MG 24 hr tablet Take 1 tablet (500 mg total) by mouth 2 (two) times daily. 180 tablet 1   moxifloxacin (AVELOX) 400 MG tablet Take 400 mg by mouth.     ondansetron  (ZOFRAN -ODT) 4 MG disintegrating tablet Take 1 tablet (4 mg total) by mouth every 8 (eight) hours as needed for nausea or vomiting. 20 tablet 0   OneTouch UltraSoft 2 Lancets MISC Use as directed. 100 each 0   pantoprazole  (PROTONIX ) 40 MG tablet Take 1 tablet (40 mg total) by mouth daily. 90 tablet 3   QUEtiapine  (SEROQUEL ) 50 MG tablet Take 1 tablet (50 mg total) by mouth at bedtime. 90 tablet 0   rosuvastatin  (CRESTOR ) 5 MG tablet Take 1 tablet (5 mg total) by mouth daily. 90 tablet 1   Semaglutide , 1 MG/DOSE, (OZEMPIC , 1 MG/DOSE,) 4 MG/3ML SOPN Inject 1 mg into the skin once a week. 9 mL 1   sertraline  (ZOLOFT ) 100 MG tablet Take 2 tablets (200 mg total) by mouth daily with breakfast. 180 tablet 0   SKYRIZI PEN 150 MG/ML  pen Inject 150 mg into the skin. Unsure of dosing frequency     tiZANidine  (ZANAFLEX ) 2 MG tablet Take 1 tablet (2 mg total) by mouth every 8 (eight) hours as needed for Muscle spasms 30 tablet 0   traZODone  (DESYREL ) 100 MG tablet Take 1 tablet (100 mg total) by mouth at bedtime as needed. 90 tablet 0   triamcinolone  cream (KENALOG ) 0.1 % Apply topically 2 (two) times daily. 30 g 1   verapamil  (CALAN -SR) 240 MG CR tablet Take 1 tablet (240 mg total) by mouth daily. 90 tablet 1   No current facility-administered medications for  this visit.    OBJECTIVE: Vitals:   03/17/24 1401  BP: 126/71  Pulse: (!) 58  Resp: 15  Temp: (!) 97 F (36.1 C)  SpO2: 100%     Body mass index is 36.72 kg/m.    ECOG FS:1 - Symptomatic but completely ambulatory  General: Well-developed, well-nourished, no acute distress. Eyes: Pink conjunctiva, anicteric sclera. HEENT: Normocephalic, moist mucous membranes. Lungs: No audible wheezing or coughing. Heart: Regular rate and rhythm. Abdomen: Soft, nontender, no obvious distention. Musculoskeletal: No edema, cyanosis, or clubbing. Neuro: Alert, answering all questions appropriately. Cranial nerves grossly intact. Skin: No rashes or petechiae noted. Psych: Normal affect. Lymphatics: No cervical, calvicular, axillary or inguinal LAD.   LAB RESULTS:  Lab Results  Component Value Date   NA 139 03/08/2024   K 4.5 03/08/2024   CL 104 03/08/2024   CO2 24 03/08/2024   GLUCOSE 105 (H) 03/08/2024   BUN 17 03/08/2024   CREATININE 0.81 03/08/2024   CALCIUM  9.4 03/08/2024   PROT 7.1 03/08/2024   ALBUMIN 3.6 04/18/2022   AST 22 03/08/2024   ALT 20 03/08/2024   ALKPHOS 75 04/18/2022   BILITOT 0.3 03/08/2024   GFRNONAA >60 04/18/2022   GFRAA 100 03/27/2020    Lab Results  Component Value Date   WBC 7.4 03/08/2024   NEUTROABS 4,373 03/08/2024   HGB 10.2 (L) 03/08/2024   HCT 33.8 (L) 03/08/2024   MCV 76.1 (L) 03/08/2024   PLT 277 03/08/2024   Lab  Results  Component Value Date   IRON 14 (L) 03/08/2024   TIBC 501 (H) 03/08/2024   IRONPCTSAT 3 (L) 03/08/2024   Lab Results  Component Value Date   FERRITIN 4 (L) 03/08/2024     STUDIES: No results found.  ASSESSMENT: Iron deficiency anemia.  PLAN:    Iron deficiency anemia: Patient's hemoglobin iron stores noted to be significantly reduced and she is symptomatic.  Patient reports her primary care physician recently gave her stool cards.  She states she has not had a colonoscopy in 6 or 7 years and acknowledges that she likely will need one in the near future.  Patient will return to clinic 5 times over the next 1 to 2 weeks to receive 200 mg IV Venofer.  She will then return to clinic in 4 months with repeat laboratory work, further evaluation, and continuation of treatment if needed.  I spent a total of 45 minutes reviewing chart data, face-to-face evaluation with the patient, counseling and coordination of care as detailed above.   Patient expressed understanding and was in agreement with this plan. She also understands that She can call clinic at any time with any questions, concerns, or complaints.    Shellie Dials, MD   03/17/2024 3:01 PM

## 2024-03-18 ENCOUNTER — Inpatient Hospital Stay

## 2024-03-18 VITALS — BP 130/67 | HR 75 | Temp 97.6°F | Resp 16

## 2024-03-18 DIAGNOSIS — D509 Iron deficiency anemia, unspecified: Secondary | ICD-10-CM | POA: Diagnosis not present

## 2024-03-18 MED ORDER — IRON SUCROSE 20 MG/ML IV SOLN
200.0000 mg | Freq: Once | INTRAVENOUS | Status: AC
  Administered 2024-03-18: 200 mg via INTRAVENOUS
  Filled 2024-03-18: qty 10

## 2024-03-18 NOTE — Patient Instructions (Signed)

## 2024-03-19 ENCOUNTER — Telehealth: Payer: Self-pay | Admitting: *Deleted

## 2024-03-19 NOTE — Telephone Encounter (Signed)
 Patient called wanted to make sure that she has the right appointment date for her IV iron it was June 23 at 1:30.  She is okay

## 2024-03-22 ENCOUNTER — Emergency Department: Admission: EM | Admit: 2024-03-22 | Discharge: 2024-03-22 | Disposition: A | Discharge status: Home / Self Care

## 2024-03-22 ENCOUNTER — Inpatient Hospital Stay

## 2024-03-22 ENCOUNTER — Other Ambulatory Visit: Payer: Self-pay

## 2024-03-22 ENCOUNTER — Emergency Department

## 2024-03-22 DIAGNOSIS — J45909 Unspecified asthma, uncomplicated: Secondary | ICD-10-CM | POA: Insufficient documentation

## 2024-03-22 DIAGNOSIS — I129 Hypertensive chronic kidney disease with stage 1 through stage 4 chronic kidney disease, or unspecified chronic kidney disease: Secondary | ICD-10-CM | POA: Diagnosis not present

## 2024-03-22 DIAGNOSIS — N189 Chronic kidney disease, unspecified: Secondary | ICD-10-CM | POA: Insufficient documentation

## 2024-03-22 DIAGNOSIS — M545 Low back pain, unspecified: Secondary | ICD-10-CM | POA: Insufficient documentation

## 2024-03-22 DIAGNOSIS — N390 Urinary tract infection, site not specified: Secondary | ICD-10-CM | POA: Diagnosis not present

## 2024-03-22 DIAGNOSIS — R509 Fever, unspecified: Secondary | ICD-10-CM | POA: Diagnosis present

## 2024-03-22 DIAGNOSIS — R519 Headache, unspecified: Secondary | ICD-10-CM | POA: Insufficient documentation

## 2024-03-22 LAB — URINALYSIS, W/ REFLEX TO CULTURE (INFECTION SUSPECTED)
Bilirubin Urine: NEGATIVE
Glucose, UA: NEGATIVE mg/dL
Hgb urine dipstick: NEGATIVE
Ketones, ur: NEGATIVE mg/dL
Leukocytes,Ua: NEGATIVE
Nitrite: NEGATIVE
Protein, ur: 30 mg/dL — AB
Specific Gravity, Urine: 1.026 (ref 1.005–1.030)
pH: 5 (ref 5.0–8.0)

## 2024-03-22 LAB — CBC WITH DIFFERENTIAL/PLATELET
Abs Immature Granulocytes: 0.03 10*3/uL (ref 0.00–0.07)
Basophils Absolute: 0.1 10*3/uL (ref 0.0–0.1)
Basophils Relative: 1 %
Eosinophils Absolute: 0.1 10*3/uL (ref 0.0–0.5)
Eosinophils Relative: 2 %
HCT: 34.5 % — ABNORMAL LOW (ref 36.0–46.0)
Hemoglobin: 10.3 g/dL — ABNORMAL LOW (ref 12.0–15.0)
Immature Granulocytes: 1 %
Lymphocytes Relative: 25 %
Lymphs Abs: 1.6 10*3/uL (ref 0.7–4.0)
MCH: 22.1 pg — ABNORMAL LOW (ref 26.0–34.0)
MCHC: 29.9 g/dL — ABNORMAL LOW (ref 30.0–36.0)
MCV: 73.9 fL — ABNORMAL LOW (ref 80.0–100.0)
Monocytes Absolute: 0.6 10*3/uL (ref 0.1–1.0)
Monocytes Relative: 9 %
Neutro Abs: 4 10*3/uL (ref 1.7–7.7)
Neutrophils Relative %: 62 %
Platelets: 242 10*3/uL (ref 150–400)
RBC: 4.67 MIL/uL (ref 3.87–5.11)
RDW: 17.4 % — ABNORMAL HIGH (ref 11.5–15.5)
WBC: 6.4 10*3/uL (ref 4.0–10.5)
nRBC: 0 % (ref 0.0–0.2)

## 2024-03-22 LAB — RESP PANEL BY RT-PCR (RSV, FLU A&B, COVID)  RVPGX2
Influenza A by PCR: NEGATIVE
Influenza B by PCR: NEGATIVE
Resp Syncytial Virus by PCR: NEGATIVE
SARS Coronavirus 2 by RT PCR: NEGATIVE

## 2024-03-22 LAB — COMPREHENSIVE METABOLIC PANEL WITH GFR
ALT: 19 U/L (ref 0–44)
AST: 25 U/L (ref 15–41)
Albumin: 3.8 g/dL (ref 3.5–5.0)
Alkaline Phosphatase: 55 U/L (ref 38–126)
Anion gap: 10 (ref 5–15)
BUN: 20 mg/dL (ref 8–23)
CO2: 23 mmol/L (ref 22–32)
Calcium: 9.7 mg/dL (ref 8.9–10.3)
Chloride: 104 mmol/L (ref 98–111)
Creatinine, Ser: 0.78 mg/dL (ref 0.44–1.00)
GFR, Estimated: >60 mL/min (ref >60)
Glucose, Bld: 97 mg/dL (ref 70–99)
Potassium: 3.7 mmol/L (ref 3.5–5.1)
Sodium: 137 mmol/L (ref 135–145)
Total Bilirubin: 0.6 mg/dL (ref 0.0–1.2)
Total Protein: 7.8 g/dL (ref 6.5–8.1)

## 2024-03-22 LAB — LACTIC ACID, PLASMA: Lactic Acid, Venous: 1.4 mmol/L (ref 0.5–1.9)

## 2024-03-22 MED ORDER — CEPHALEXIN 500 MG PO CAPS
500.0000 mg | ORAL_CAPSULE | Freq: Three times a day (TID) | ORAL | 0 refills | Status: AC
  Filled 2024-03-22: qty 15, 5d supply, fill #0

## 2024-03-22 MED ORDER — ACETAMINOPHEN 325 MG PO TABS
650.0000 mg | ORAL_TABLET | Freq: Once | ORAL | Status: AC
  Administered 2024-03-22: 650 mg via ORAL
  Filled 2024-03-22: qty 2

## 2024-03-22 MED ORDER — SODIUM CHLORIDE 0.9 % IV SOLN
2.0000 g | Freq: Once | INTRAVENOUS | Status: AC
  Administered 2024-03-22: 2 g via INTRAVENOUS
  Filled 2024-03-22: qty 20

## 2024-03-22 MED ORDER — IOHEXOL 300 MG/ML  SOLN
100.0000 mL | Freq: Once | INTRAMUSCULAR | Status: AC | PRN
  Administered 2024-03-22: 100 mL via INTRAVENOUS

## 2024-03-22 MED ORDER — ONDANSETRON HCL 4 MG/2ML IJ SOLN
4.0000 mg | Freq: Once | INTRAMUSCULAR | Status: AC
  Administered 2024-03-22: 4 mg via INTRAVENOUS
  Filled 2024-03-22: qty 2

## 2024-03-22 MED ORDER — SODIUM CHLORIDE 0.9 % IV BOLUS
500.0000 mL | Freq: Once | INTRAVENOUS | Status: AC
  Administered 2024-03-22: 500 mL via INTRAVENOUS

## 2024-03-22 NOTE — ED Provider Notes (Signed)
 [facility] Emergency Department Provider Note     Event Date/Time   First MD Initiated Contact with Patient 03/22/24 1439     (approximate)   History   Headache   HPI  April Singleton is a 73 y.o. female with a history of diabetes, HTN, asthma, GERD, CKD, and chronic back pain presents to the ED for evaluation of low back pain, low back, low-grade fevers, headaches.  Patient apparently has had a lumbar medial branch blocks and radiofrequency ablation performed most recently 1 month ago.  She has been on physical therapy and pain medicine in the interim.  Patient began to experience headaches in early June.  She also would note intermittent fevers with Tmax of 100.8 F nearly daily.  She apparently was evaluated about 2 weeks ago and treated for UTI and sinus infection with a course of Cipro  and cefdinir .  Additional point-of-care viral testing was negative at that time.  She initially presented to [facility] for evaluation, what was referred to the ED for further evaluation.  She presents to the ED in no acute distress.  Patient denies any recent injury, trauma, or fall.  No bladder or bowel incontinence, foot drop, or saddle anesthesia.  No vision change, nausea, vomiting, paralysis, or slurred speech reported at this time.  No rashes or skin changes are noted.  Physical Exam   Triage Vital Signs: ED Triage Vitals  Encounter Vitals Group     BP 03/22/24 1112 134/70     Girls Systolic BP Percentile --      Girls Diastolic BP Percentile --      Boys Systolic BP Percentile --      Boys Diastolic BP Percentile --      Pulse Rate 03/22/24 1112 80     Resp 03/22/24 1112 18     Temp 03/22/24 1112 98.7 F (37.1 C)     Temp Source 03/22/24 1112 Oral     SpO2 03/22/24 1112 100 %     Weight 03/22/24 1114 188 lb (85.3 kg)     Height 03/22/24 1114 5' (1.524 m)     Head Circumference --      Peak Flow --      Pain Score 03/22/24 1114 3     Pain Loc --      Pain  Education --      Exclude from Growth Chart --     Most recent vital signs: Vitals:   03/22/24 1514 03/22/24 1911  BP: (!) 127/52 (!) 173/73  Pulse: 61 63  Resp: 18 16  Temp: 98.1 F (36.7 C) 99 F (37.2 C)  SpO2: 100% 100%    General Awake, no distress. NAD HEENT NCAT. PERRL. EOMI. No rhinorrhea. Mucous membranes are moist.  CV:  Good peripheral perfusion. RRR RESP:  Normal effort. CTA ABD:  No distention.  Soft and nontender.  No rebound, guarding, or rigidity noted. MSK:  AROM of all extremities NEURO: CN 2-12 grossly intact   ED Results / Procedures / Treatments   Labs (all labs ordered are listed, but only abnormal results are displayed) Labs Reviewed  URINE CULTURE - Abnormal; Notable for the following components:      Result Value   Culture 10,000 COLONIES/mL KLEBSIELLA PNEUMONIAE (*)    Organism ID, Bacteria KLEBSIELLA PNEUMONIAE (*)    All other components within normal limits  CBC WITH DIFFERENTIAL/PLATELET - Abnormal; Notable for the following components:   Hemoglobin 10.3 (*)  HCT 34.5 (*)    MCV 73.9 (*)    MCH 22.1 (*)    MCHC 29.9 (*)    RDW 17.4 (*)    All other components within normal limits  URINALYSIS, W/ REFLEX TO CULTURE (INFECTION SUSPECTED) - Abnormal; Notable for the following components:   Color, Urine AMBER (*)    APPearance HAZY (*)    Protein, ur 30 (*)    Bacteria, UA RARE (*)    All other components within normal limits  RESP PANEL BY RT-PCR (RSV, FLU A&B, COVID)  RVPGX2  LACTIC ACID, PLASMA  COMPREHENSIVE METABOLIC PANEL WITH GFR     EKG   RADIOLOGY  I personally viewed and evaluated these images as part of my medical decision making, as well as reviewing the written report by the radiologist.  ED Provider Interpretation: No acute thoracic findings  CT ABD/Pelvis w/o CM  IMPRESSION: 1. No acute abdominopelvic findings. 2. Nonobstructing right lower pole 4 mm renal stone. 3. Nodular hepatic contour, which can be  seen in the setting of cirrhosis. 4. Severe stenosis of the SMA origin. 5.  Aortic Atherosclerosis (ICD10-I70.0).   CXR    IMPRESSION: No acute findings.  PROCEDURES:  Critical Care performed: No  Procedures   MEDICATIONS ORDERED IN ED: Medications  iohexol  (OMNIPAQUE ) 300 MG/ML solution 100 mL (100 mLs Intravenous Contrast Given 03/22/24 1640)  sodium chloride  0.9 % bolus 500 mL (0 mLs Intravenous Stopped 03/22/24 1953)  ondansetron  (ZOFRAN ) injection 4 mg (4 mg Intravenous Given 03/22/24 1835)  acetaminophen  (TYLENOL ) tablet 650 mg (650 mg Oral Given 03/22/24 1835)  cefTRIAXone  (ROCEPHIN ) 2 g in sodium chloride  0.9 % 100 mL IVPB (0 g Intravenous Stopped 03/22/24 1913)     IMPRESSION / MDM / ASSESSMENT AND PLAN / ED COURSE  I reviewed the triage vital signs and the nursing notes.                              Differential diagnosis includes, but is not limited to,ovarian cyst, ovarian torsion, acute appendicitis, diverticulitis, urinary tract infection/pyelonephritis, bowel obstruction, colitis, renal colic, gastroenteritis, hernia, etc.  Patient's presentation is most consistent with acute presentation with potential threat to life or bodily function.  Patient's diagnosis is consistent with UTI.  Patient with reassuring exam and workup at this time.  Labs overall reassuring without evidence of leukocytosis or critical anemia.  No electrolyte abnormalities appreciated.  Normal liver and kidney function noted.  Lactic is flat at 1.4.  UA reveals rare bacteria with hyaline casts and mucus.  Urine culture is pending at this time.  Patient treated empirically with an IV dose of Rocephin  in the ED.  She is endorsing improvement of her symptoms at the time of this interval evaluation.  We considered admission for this patient to continue her treatment with IV antibiotics.  Patient declined at this time is stable at this time for outpatient management.  Patient will be discharged home with  prescriptions for Keflex . Patient is to follow up with her primary provider as suggested, as needed or otherwise directed. Patient is given ED precautions to return to the ED for any worsening or new symptoms.  Clinical Course as of 03/28/24 1805  Sun Mar 28, 2024  1804 Organism ID, Bacteria(!): KLEBSIELLA PNEUMONIAE [JM]    Clinical Course User Index [JM] Caprice Wasko, Candida LULLA Kings, PA-C    FINAL CLINICAL IMPRESSION(S) / ED DIAGNOSES   Final diagnoses:  Lower  urinary tract infectious disease     Rx / DC Orders   ED Discharge Orders          Ordered    cephALEXin  (KEFLEX ) 500 MG capsule  3 times daily        03/22/24 1923             Note:  This document was prepared using Dragon voice recognition software and may include unintentional dictation errors.    Loyd Candida LULLA Aldona, PA-C 03/28/24 1810    Clarine Ozell LABOR, MD 03/30/24 1505

## 2024-03-22 NOTE — ED Triage Notes (Addendum)
 Pt sts that she gotten a cardiac ablation three weeks ago. Pt sts that since then she has been getting a low grade fever as well as headaches and low back pain for the last three weeks. Pt was seen a a UC and was prescribed penicillin for a possible UTI vs sinus infection twelve days ago. Pt then went to [facility] today for a follow up visit and was directed to the ED.

## 2024-03-22 NOTE — ED Notes (Signed)
 Pt verbalizes understanding of discharge instructions.

## 2024-03-22 NOTE — Discharge Instructions (Addendum)
 You are being treated for a possible UTI.  Take the antibiotics as directed.  Urine culture is pending and should provide a primary result tomorrow.  Take plenty of fluids and have your bladder as needed.  Follow-up with your primary provider for ongoing evaluation.  Return to the ED if needed.

## 2024-03-23 ENCOUNTER — Encounter: Payer: Self-pay | Admitting: Oncology

## 2024-03-23 ENCOUNTER — Other Ambulatory Visit: Payer: Self-pay

## 2024-03-24 ENCOUNTER — Inpatient Hospital Stay

## 2024-03-24 VITALS — BP 142/54 | HR 79 | Temp 99.2°F | Resp 17

## 2024-03-24 DIAGNOSIS — D509 Iron deficiency anemia, unspecified: Secondary | ICD-10-CM

## 2024-03-24 LAB — URINE CULTURE
Culture: 10000 — AB
Special Requests: NORMAL

## 2024-03-24 MED ORDER — IRON SUCROSE 20 MG/ML IV SOLN
200.0000 mg | Freq: Once | INTRAVENOUS | Status: AC
  Administered 2024-03-24: 200 mg via INTRAVENOUS

## 2024-03-24 NOTE — Patient Instructions (Signed)

## 2024-03-26 ENCOUNTER — Inpatient Hospital Stay

## 2024-03-26 VITALS — BP 133/47 | HR 73 | Temp 96.5°F | Resp 18

## 2024-03-26 DIAGNOSIS — D509 Iron deficiency anemia, unspecified: Secondary | ICD-10-CM | POA: Diagnosis not present

## 2024-03-26 MED ORDER — IRON SUCROSE 20 MG/ML IV SOLN
200.0000 mg | Freq: Once | INTRAVENOUS | Status: AC
  Administered 2024-03-26: 200 mg via INTRAVENOUS

## 2024-03-26 MED ORDER — SODIUM CHLORIDE 0.9% FLUSH
10.0000 mL | Freq: Once | INTRAVENOUS | Status: AC | PRN
  Administered 2024-03-26: 10 mL
  Filled 2024-03-26: qty 10

## 2024-03-29 ENCOUNTER — Inpatient Hospital Stay

## 2024-03-29 VITALS — BP 132/59 | HR 72 | Resp 18

## 2024-03-29 DIAGNOSIS — D509 Iron deficiency anemia, unspecified: Secondary | ICD-10-CM

## 2024-03-29 MED ORDER — IRON SUCROSE 20 MG/ML IV SOLN
200.0000 mg | Freq: Once | INTRAVENOUS | Status: AC
  Administered 2024-03-29: 200 mg via INTRAVENOUS
  Filled 2024-03-29: qty 10

## 2024-03-31 ENCOUNTER — Inpatient Hospital Stay: Attending: Oncology

## 2024-03-31 VITALS — BP 134/63 | HR 60 | Temp 95.0°F | Resp 17

## 2024-03-31 DIAGNOSIS — D509 Iron deficiency anemia, unspecified: Secondary | ICD-10-CM | POA: Insufficient documentation

## 2024-03-31 MED ORDER — IRON SUCROSE 20 MG/ML IV SOLN
200.0000 mg | Freq: Once | INTRAVENOUS | Status: AC
  Administered 2024-03-31: 200 mg via INTRAVENOUS
  Filled 2024-03-31: qty 10

## 2024-03-31 MED ORDER — SODIUM CHLORIDE 0.9% FLUSH
10.0000 mL | Freq: Once | INTRAVENOUS | Status: AC | PRN
  Administered 2024-03-31: 10 mL
  Filled 2024-03-31: qty 10

## 2024-03-31 NOTE — Progress Notes (Signed)
Patient tolerated Venofer infusion well. Explained recommendation of 30 min post monitoring. Patient refused to wait post monitoring. Educated on what signs to watch for & to call with any concerns. No questions, discharged. Stable  

## 2024-03-31 NOTE — Patient Instructions (Signed)

## 2024-04-05 ENCOUNTER — Ambulatory Visit: Admitting: Family Medicine

## 2024-04-05 ENCOUNTER — Other Ambulatory Visit: Payer: Self-pay

## 2024-04-05 ENCOUNTER — Other Ambulatory Visit (HOSPITAL_COMMUNITY): Payer: Self-pay

## 2024-04-05 ENCOUNTER — Encounter: Payer: Self-pay | Admitting: Oncology

## 2024-04-05 ENCOUNTER — Encounter: Payer: Self-pay | Admitting: Family Medicine

## 2024-04-05 VITALS — BP 130/76 | HR 76 | Resp 16 | Ht 60.0 in | Wt 188.0 lb

## 2024-04-05 DIAGNOSIS — D649 Anemia, unspecified: Secondary | ICD-10-CM

## 2024-04-05 DIAGNOSIS — D509 Iron deficiency anemia, unspecified: Secondary | ICD-10-CM | POA: Diagnosis not present

## 2024-04-05 DIAGNOSIS — R300 Dysuria: Secondary | ICD-10-CM | POA: Diagnosis not present

## 2024-04-05 DIAGNOSIS — N3010 Interstitial cystitis (chronic) without hematuria: Secondary | ICD-10-CM

## 2024-04-05 DIAGNOSIS — N390 Urinary tract infection, site not specified: Secondary | ICD-10-CM

## 2024-04-05 DIAGNOSIS — Z09 Encounter for follow-up examination after completed treatment for conditions other than malignant neoplasm: Secondary | ICD-10-CM | POA: Diagnosis not present

## 2024-04-05 MED ORDER — FLUCONAZOLE 150 MG PO TABS
150.0000 mg | ORAL_TABLET | ORAL | 0 refills | Status: DC | PRN
  Filled 2024-04-05: qty 4, 12d supply, fill #0

## 2024-04-05 MED ORDER — ONDANSETRON 4 MG PO TBDP
4.0000 mg | ORAL_TABLET | Freq: Three times a day (TID) | ORAL | 0 refills | Status: AC | PRN
  Filled 2024-04-05 – 2024-04-13 (×4): qty 20, 7d supply, fill #0

## 2024-04-05 MED ORDER — OXYBUTYNIN CHLORIDE ER 10 MG PO TB24
10.0000 mg | ORAL_TABLET | Freq: Every day | ORAL | 0 refills | Status: DC
  Filled 2024-04-05: qty 30, 30d supply, fill #0

## 2024-04-05 NOTE — Progress Notes (Signed)
 Name: April Singleton   MRN: [id number]    DOB: [birth year]   Date:04/05/2024       Progress Note  Chief Complaint  Patient presents with   Dysuria    W/frequency. Did Kelfex and IV Rocephin  in ER. Taking OTC Pyridium      Subjective:   April Singleton is a 73 y.o. female, presents for dysuria Recently est new pt   Seen for dysuria 6/12  at [facility] clinic tx with cefdinir  Urine culture neg - Culture shows less than 10,000 colony forming units of bacteria per milliliter of urine. This colony count is not generally considered to be clinically significant.   ED 6/23 for UTI sx, rocephin  in ED, then outpt po tx keflex  500 mg TID, urine culture + for 89999 colonies K. Pneumoniae  Still having pain, bladder spasms, using pyridium  Unable to do dip today, culture ordered  Hx of IC, yeast infections causing urinary sx, previously consulting with urology but her urologist retired      Current Outpatient Medications:    acetaminophen  (TYLENOL ) 500 MG tablet, Take 1,000 mg by mouth every 6 (six) hours as needed., Disp: , Rfl:    Blood Glucose Monitoring Suppl (BLOOD GLUCOSE MONITOR SYSTEM) w/Device KIT, Use as directed, Disp: 1 kit, Rfl: 0   clotrimazole -betamethasone  (LOTRISONE ) cream, Apply 1 Application topically daily as needed., Disp: 45 g, Rfl: 3   diclofenac  (VOLTAREN ) 75 MG EC tablet, Take 1 tablet (75 mg total) by mouth 2 (two) times daily with a meal., Disp: 60 tablet, Rfl: 1   estradiol  (ESTRACE ) 1 MG tablet, Take 1 tablet (1 mg total) by mouth daily., Disp: 90 tablet, Rfl: 1   fluconazole  (DIFLUCAN ) 150 MG tablet, Take 1 tablet (150 mg total) by mouth every 3 (three) days as needed (for vaginal itching/yeast infection sx)., Disp: 2 tablet, Rfl: 0   gabapentin  (NEURONTIN ) 100 MG capsule, Take 1 capsule (100 mg total) by mouth 2 (two) times daily. If no improvement after 5 days increase to 3 times daily, Disp: 60 capsule, Rfl: 11   glucose blood test strip, Use as directed., Disp:  100 each, Rfl: 0   hydrochlorothiazide  (HYDRODIURIL ) 12.5 MG tablet, Take 1 tablet (12.5 mg total) by mouth daily., Disp: 90 tablet, Rfl: 1   ipratropium (ATROVENT ) 0.06 % nasal spray, Place 2 sprays into both nostrils 3 (three) times daily as needed for Rhinitis, Disp: 15 mL, Rfl: 0   ketoconazole  (NIZORAL ) 2 % cream, Apply topically 2 (two) times daily To affected areas, use for up to 3-4 weeks, Disp: 30 g, Rfl: 2   lamoTRIgine  (LAMICTAL ) 100 MG tablet, Take 1 tablet (100 mg total) by mouth daily. (Patient taking differently: Take 50 mg by mouth daily.), Disp: 90 tablet, Rfl: 0   Lifitegrast  (XIIDRA ) 5 % SOLN, Place 1 drop into affected eyes 2 (two) times daily., Disp: 180 each, Rfl: 4   losartan  (COZAAR ) 50 MG tablet, Take 1 tablet (50 mg total) by mouth daily., Disp: 90 tablet, Rfl: 1   metFORMIN  (GLUCOPHAGE -XR) 500 MG 24 hr tablet, Take 1 tablet (500 mg total) by mouth 2 (two) times daily., Disp: 180 tablet, Rfl: 1   moxifloxacin (AVELOX) 400 MG tablet, Take 400 mg by mouth., Disp: , Rfl:    ondansetron  (ZOFRAN -ODT) 4 MG disintegrating tablet, Take 1 tablet (4 mg total) by mouth every 8 (eight) hours as needed for nausea or vomiting., Disp: 20 tablet, Rfl: 0   OneTouch UltraSoft 2 Lancets MISC, Use as directed.,  Disp: 100 each, Rfl: 0   pantoprazole  (PROTONIX ) 40 MG tablet, Take 1 tablet (40 mg total) by mouth daily., Disp: 90 tablet, Rfl: 3   QUEtiapine  (SEROQUEL ) 50 MG tablet, Take 1 tablet (50 mg total) by mouth at bedtime., Disp: 90 tablet, Rfl: 0   rosuvastatin  (CRESTOR ) 5 MG tablet, Take 1 tablet (5 mg total) by mouth daily., Disp: 90 tablet, Rfl: 1   Semaglutide , 1 MG/DOSE, (OZEMPIC , 1 MG/DOSE,) 4 MG/3ML SOPN, Inject 1 mg into the skin once a week., Disp: 9 mL, Rfl: 1   sertraline  (ZOLOFT ) 100 MG tablet, Take 2 tablets (200 mg total) by mouth daily with breakfast., Disp: 180 tablet, Rfl: 0   SKYRIZI PEN 150 MG/ML pen, Inject 150 mg into the skin. Unsure of dosing frequency, Disp: , Rfl:     tiZANidine  (ZANAFLEX ) 2 MG tablet, Take 1 tablet (2 mg total) by mouth every 8 (eight) hours as needed for Muscle spasms, Disp: 30 tablet, Rfl: 0   traZODone  (DESYREL ) 100 MG tablet, Take 1 tablet (100 mg total) by mouth at bedtime as needed., Disp: 90 tablet, Rfl: 0   triamcinolone  cream (KENALOG ) 0.1 %, Apply topically 2 (two) times daily., Disp: 30 g, Rfl: 1   verapamil  (CALAN -SR) 240 MG CR tablet, Take 1 tablet (240 mg total) by mouth daily., Disp: 90 tablet, Rfl: 1  Patient Active Problem List   Diagnosis Date Noted   Iron  deficiency anemia 03/17/2024   Psoriatic arthritis (HCC) 02/20/2023   Total knee replacement status 04/29/2022   Arthritis of right hip 04/05/2022   Trochanteric bursitis of right hip 04/05/2022   Type 2 diabetes mellitus without complications (HCC) 04/01/2022   Primary osteoarthritis of left knee 02/10/2022   Panniculitis 06/12/2021   Primary osteoarthritis of right knee 10/26/2020   Dyslipidemia 07/24/2020   Cirrhosis of liver without ascites (HCC) 07/24/2020   Rotator cuff arthropathy of left shoulder 03/27/2020   Class 2 severe obesity with serious comorbidity and body mass index (BMI) of 36.0 to 36.9 in adult [facility]) 03/27/2020   Essential hypertension 03/27/2020   Type 2 diabetes mellitus with hyperglycemia, without long-term current use of insulin  (HCC) 03/15/2020   Chronic fatigue 03/25/2018   Chronic foot pain, right 06/10/2016    Past Surgical History:  Procedure Laterality Date   ABDOMINAL HYSTERECTOMY     BREAST BIOPSY     Pt unsure of type of biopsy or even of which side   KNEE ARTHROPLASTY Left 04/29/2022   Procedure: COMPUTER ASSISTED TOTAL KNEE ARTHROPLASTY;  Surgeon: Mardee Lynwood SQUIBB, MD;  Location: [facility] ORS;  Service: Orthopedics;  Laterality: Left;   REDUCTION MAMMAPLASTY     TONSILLECTOMY      Family History  Problem Relation Age of Onset   Lung cancer Mother    Hypertension Mother    Congestive Heart Failure Mother    Liver  cancer Father    Lung cancer Father    Breast cancer Maternal Aunt    Breast cancer Maternal Aunt     Social History   Tobacco Use   Smoking status: Never   Smokeless tobacco: Never  Vaping Use   Vaping status: Never Used  Substance Use Topics   Alcohol use: Not Currently    Comment: rarely   Drug use: Never     Allergies  Allergen Reactions   Codeine Other (See Comments)    Nausea and vomiting   Prednisone  Other (See Comments)    Makes her violent , patient reports only in  high doses   Sulfa Antibiotics Hives    Health Maintenance  Topic Date Due   Medicare Annual Wellness (AWV)  10/12/2022   OPHTHALMOLOGY EXAM  12/05/2022   COVID-19 Vaccine (4 - 2024-25 season) 04/21/2024 (Originally 06/01/2023)   FOOT EXAM  04/27/2024 (Originally 06/22/1961)   Colonoscopy  03/08/2025 (Originally 09/11/2022)   Hepatitis B Vaccines (1 of 3 - Risk 3-dose series) 04/05/2025 (Originally 06/22/2012)   INFLUENZA VACCINE  04/30/2024   HEMOGLOBIN A1C  09/07/2024   Diabetic kidney evaluation - Urine ACR  03/08/2025   Diabetic kidney evaluation - eGFR measurement  03/22/2025   MAMMOGRAM  09/04/2025   DTaP/Tdap/Td (2 - Td or Tdap) 08/14/2031   Pneumococcal Vaccine: 50+ Years  Completed   DEXA SCAN  Completed   Hepatitis C Screening  Completed   Zoster Vaccines- Shingrix  Completed   HPV VACCINES  Aged Out   Meningococcal B Vaccine  Aged Out    Chart Review Today: I personally reviewed active problem list, medication list, allergies, family history, social history, health maintenance, notes from last encounter, lab results, imaging with the patient/caregiver today.   Review of Systems  Constitutional: Negative.   HENT: Negative.    Eyes: Negative.   Respiratory: Negative.    Cardiovascular: Negative.   Gastrointestinal: Negative.   Endocrine: Negative.   Genitourinary: Negative.   Musculoskeletal: Negative.   Skin: Negative.   Allergic/Immunologic: Negative.   Neurological:  Negative.   Hematological: Negative.   Psychiatric/Behavioral: Negative.    All other systems reviewed and are negative.    Objective:   Vitals:   04/05/24 1259  BP: 130/76  Pulse: 76  Resp: 16  SpO2: 96%  Weight: 188 lb (85.3 kg)  Height: 5' (1.524 m)    Body mass index is 36.72 kg/m.  Physical Exam Vitals and nursing note reviewed.  Constitutional:      General: She is not in acute distress.    Appearance: Normal appearance. She is well-developed. She is obese. She is not ill-appearing, toxic-appearing or diaphoretic.  HENT:     Head: Normocephalic and atraumatic.     Right Ear: External ear normal.     Left Ear: External ear normal.     Nose: Nose normal.  Eyes:     General: No scleral icterus.       Right eye: No discharge.        Left eye: No discharge.     Conjunctiva/sclera: Conjunctivae normal.  Neck:     Trachea: No tracheal deviation.  Cardiovascular:     Rate and Rhythm: Normal rate.  Pulmonary:     Effort: Pulmonary effort is normal. No respiratory distress.     Breath sounds: No stridor.  Abdominal:     General: Bowel sounds are normal. There is no distension.     Palpations: Abdomen is soft.     Tenderness: There is no abdominal tenderness. There is no right CVA tenderness or left CVA tenderness.  Skin:    General: Skin is warm and dry.     Findings: No rash.  Neurological:     Mental Status: She is alert.     Motor: No abnormal muscle tone.     Coordination: Coordination normal.     Gait: Gait normal.  Psychiatric:        Mood and Affect: Mood normal.        Behavior: Behavior normal.      Functional Status Survey:   Results for orders placed or performed  during the hospital encounter of 03/22/24  Lactic acid, plasma   Collection Time: 03/22/24 11:16 AM  Result Value Ref Range   Lactic Acid, Venous 1.4 0.5 - 1.9 mmol/L  Comprehensive metabolic panel   Collection Time: 03/22/24 11:16 AM  Result Value Ref Range   Sodium 137 135 - 145  mmol/L   Potassium 3.7 3.5 - 5.1 mmol/L   Chloride 104 98 - 111 mmol/L   CO2 23 22 - 32 mmol/L   Glucose, Bld 97 70 - 99 mg/dL   BUN 20 8 - 23 mg/dL   Creatinine, Ser 9.21 0.44 - 1.00 mg/dL   Calcium  9.7 8.9 - 10.3 mg/dL   Total Protein 7.8 6.5 - 8.1 g/dL   Albumin 3.8 3.5 - 5.0 g/dL   AST 25 15 - 41 U/L   ALT 19 0 - 44 U/L   Alkaline Phosphatase 55 38 - 126 U/L   Total Bilirubin 0.6 0.0 - 1.2 mg/dL   GFR, Estimated >39 >39 mL/min   Anion gap 10 5 - 15  CBC with Differential   Collection Time: 03/22/24 11:16 AM  Result Value Ref Range   WBC 6.4 4.0 - 10.5 K/uL   RBC 4.67 3.87 - 5.11 MIL/uL   Hemoglobin 10.3 (L) 12.0 - 15.0 g/dL   HCT 65.4 (L) 63.9 - 53.9 %   MCV 73.9 (L) 80.0 - 100.0 fL   MCH 22.1 (L) 26.0 - 34.0 pg   MCHC 29.9 (L) 30.0 - 36.0 g/dL   RDW 82.5 (H) 88.4 - 84.4 %   Platelets 242 150 - 400 K/uL   nRBC 0.0 0.0 - 0.2 %   Neutrophils Relative % 62 %   Neutro Abs 4.0 1.7 - 7.7 K/uL   Lymphocytes Relative 25 %   Lymphs Abs 1.6 0.7 - 4.0 K/uL   Monocytes Relative 9 %   Monocytes Absolute 0.6 0.1 - 1.0 K/uL   Eosinophils Relative 2 %   Eosinophils Absolute 0.1 0.0 - 0.5 K/uL   Basophils Relative 1 %   Basophils Absolute 0.1 0.0 - 0.1 K/uL   Immature Granulocytes 1 %   Abs Immature Granulocytes 0.03 0.00 - 0.07 K/uL  Urine Culture   Collection Time: 03/22/24  3:27 PM   Specimen: Urine, Clean Catch  Result Value Ref Range   Specimen Description      URINE, CLEAN CATCH Performed at [facility], [address] [place], [place] [address]    Special Requests      Normal Performed at [facility], [address] [place], [place] [address]    Culture 10,000 COLONIES/mL KLEBSIELLA PNEUMONIAE (A)    Report Status 03/24/2024 FINAL    Organism ID, Bacteria KLEBSIELLA PNEUMONIAE (A)       Susceptibility   Klebsiella pneumoniae - MIC*    AMPICILLIN >=32 RESISTANT Resistant     CEFAZOLIN  <=4 SENSITIVE Sensitive     CEFEPIME <=0.12 SENSITIVE  Sensitive     CEFTRIAXONE  <=0.25 SENSITIVE Sensitive     CIPROFLOXACIN  >=4 RESISTANT Resistant     GENTAMICIN <=1 SENSITIVE Sensitive     IMIPENEM <=0.25 SENSITIVE Sensitive     NITROFURANTOIN  256 RESISTANT Resistant     TRIMETH/SULFA 80 RESISTANT Resistant     AMPICILLIN/SULBACTAM 8 SENSITIVE Sensitive     PIP/TAZO 8 SENSITIVE Sensitive ug/mL    * 10,000 COLONIES/mL KLEBSIELLA PNEUMONIAE  Resp panel by RT-PCR (RSV, Flu A&B, Covid) Urine, Clean Catch   Collection Time: 03/22/24  3:27 PM  Specimen: Urine, Clean Catch; Nasal Swab  Result Value Ref Range   SARS Coronavirus 2 by RT PCR NEGATIVE NEGATIVE   Influenza A by PCR NEGATIVE NEGATIVE   Influenza B by PCR NEGATIVE NEGATIVE   Resp Syncytial Virus by PCR NEGATIVE NEGATIVE  Urinalysis, w/ Reflex to Culture (Infection Suspected) -Urine, Clean Catch   Collection Time: 03/22/24  3:27 PM  Result Value Ref Range   Specimen Source URINE, CLEAN CATCH    Color, Urine AMBER (A) YELLOW   APPearance HAZY (A) CLEAR   Specific Gravity, Urine 1.026 1.005 - 1.030   pH 5.0 5.0 - 8.0   Glucose, UA NEGATIVE NEGATIVE mg/dL   Hgb urine dipstick NEGATIVE NEGATIVE   Bilirubin Urine NEGATIVE NEGATIVE   Ketones, ur NEGATIVE NEGATIVE mg/dL   Protein, ur 30 (A) NEGATIVE mg/dL   Nitrite NEGATIVE NEGATIVE   Leukocytes,Ua NEGATIVE NEGATIVE   RBC / HPF 0-5 0 - 5 RBC/hpf   WBC, UA 0-5 0 - 5 WBC/hpf   Bacteria, UA RARE (A) NONE SEEN   Squamous Epithelial / HPF 0-5 0 - 5 /HPF   Mucus PRESENT    Hyaline Casts, UA PRESENT       Assessment & Plan:     ICD-10-CM   1. Dysuria  R30.0 Urine Culture    fluconazole  (DIFLUCAN ) 150 MG tablet    oxybutynin  (DITROPAN  XL) 10 MG 24 hr tablet    ondansetron  (ZOFRAN -ODT) 4 MG disintegrating tablet    Ambulatory referral to Urology   tx with abx x 3, hx of IC, recheck culture, could try OAB med?, tx for possible secondary yeast infection?  ref urology    2. Encounter for examination following treatment at  hospital  Z09 Ambulatory referral to Urology   reviewed labs, imaging results extensively with pt today    3. Recurrent UTI  N39.0 fluconazole  (DIFLUCAN ) 150 MG tablet    oxybutynin  (DITROPAN  XL) 10 MG 24 hr tablet    Ambulatory referral to Urology   will follow culture to see if abx are indicated    4. Interstitial cystitis  N30.10 Ambulatory referral to Urology   need to est with urology    5. Iron  deficiency anemia, unspecified iron  deficiency anemia type  D50.9    she brought back stool cards, neg, managing with hematology and already having improving sx    6. Anemia, unspecified type  D64.9 POC Hemoccult Bld/Stl (3-Cd Home Screen)    POC Hemoccult Bld/Stl (3-Cd Home Screen)       Michelene Cower, PA-C 04/05/24 1:12 PM

## 2024-04-06 LAB — URINE CULTURE
MICRO NUMBER:: 16666225
SPECIMEN QUALITY:: ADEQUATE

## 2024-04-06 LAB — POC HEMOCCULT BLD/STL (HOME/3-CARD/SCREEN)
Card #2 Fecal Occult Blod, POC: NEGATIVE
Card #3 Fecal Occult Blood, POC: NEGATIVE
Fecal Occult Blood, POC: NEGATIVE

## 2024-04-07 ENCOUNTER — Ambulatory Visit: Payer: Self-pay | Admitting: Family Medicine

## 2024-04-07 ENCOUNTER — Ambulatory Visit: Admitting: Family Medicine

## 2024-04-07 ENCOUNTER — Other Ambulatory Visit: Payer: Self-pay

## 2024-04-08 ENCOUNTER — Other Ambulatory Visit: Payer: Self-pay

## 2024-04-09 ENCOUNTER — Other Ambulatory Visit: Payer: Self-pay

## 2024-04-09 ENCOUNTER — Telehealth: Payer: Self-pay | Admitting: Family Medicine

## 2024-04-09 NOTE — Telephone Encounter (Signed)
 Pt stopped by and stated that she is no better as fas as the urine. Still having burning when urinating, pain, having to keep getting up to use restroom. Would like to know if your able to call something in or help give her some type of relief. Uses [facility] pharmacy. Pt left phone at home and will not be able to get to it for another hour. Pt was just seen earlier this week

## 2024-04-12 ENCOUNTER — Other Ambulatory Visit: Payer: Self-pay

## 2024-04-12 NOTE — Telephone Encounter (Signed)
 Left detailed vm

## 2024-04-13 ENCOUNTER — Encounter: Payer: Self-pay | Admitting: Family Medicine

## 2024-04-13 ENCOUNTER — Encounter: Payer: Self-pay | Admitting: Oncology

## 2024-04-13 ENCOUNTER — Other Ambulatory Visit: Payer: Self-pay

## 2024-04-13 ENCOUNTER — Inpatient Hospital Stay

## 2024-04-13 ENCOUNTER — Ambulatory Visit: Admitting: Family Medicine

## 2024-04-13 VITALS — BP 122/68 | HR 79 | Resp 16 | Ht 60.0 in | Wt 188.0 lb

## 2024-04-13 DIAGNOSIS — N3010 Interstitial cystitis (chronic) without hematuria: Secondary | ICD-10-CM

## 2024-04-13 DIAGNOSIS — N390 Urinary tract infection, site not specified: Secondary | ICD-10-CM

## 2024-04-13 DIAGNOSIS — R300 Dysuria: Secondary | ICD-10-CM | POA: Diagnosis not present

## 2024-04-13 DIAGNOSIS — R350 Frequency of micturition: Secondary | ICD-10-CM

## 2024-04-13 LAB — POCT URINALYSIS DIPSTICK
Appearance: NORMAL
Bilirubin, UA: NEGATIVE
Glucose, UA: NEGATIVE
Ketones, UA: NEGATIVE
Protein, UA: POSITIVE — AB
Spec Grav, UA: 1.015 (ref 1.010–1.025)
Urobilinogen, UA: 0.2 U/dL
pH, UA: 6 (ref 5.0–8.0)

## 2024-04-13 MED ORDER — FLUCONAZOLE 150 MG PO TABS
150.0000 mg | ORAL_TABLET | ORAL | 0 refills | Status: DC | PRN
  Filled 2024-04-13 – 2024-05-14 (×3): qty 4, 12d supply, fill #0

## 2024-04-13 MED ORDER — CEPHALEXIN 500 MG PO CAPS
500.0000 mg | ORAL_CAPSULE | Freq: Three times a day (TID) | ORAL | 0 refills | Status: DC
  Filled 2024-04-13: qty 21, 7d supply, fill #0

## 2024-04-13 NOTE — Progress Notes (Signed)
 Patient ID: April Singleton, female    DOB: Aug 17, 1951, 73 y.o.   MRN: [id number]  PCP: Leavy Mole, PA-C  Chief Complaint  Patient presents with   Dysuria    Last urine culture neg- still having pain and frequency    Subjective:   April Singleton is a 73 y.o. female, presents to clinic with CC of the following:  HPI  Pt already on vaginal estrogen and HRT Hx of IC- not on meds Already referred to urology but not scheduled for an appt yet - rechecked referral today with her and sent msg to referral coordinator No improvement with the following - trial of oxybutinin, tx of possible yeast infection with diflucan , recheck urine culture was neg but pt continues to have sx of urgency, frequency, and pain Urine dip today positive for LE, nitrates blood protein   Discussed the use of AI scribe software for clinical note transcription with the patient, who gave verbal consent to proceed.  History of Present Illness April Singleton is a 73 year old female who presents with recurrent urinary symptoms.  Lower urinary tract symptoms - Ongoing bladder spasms, urinary frequency, and dysuria since at least March 22, 2024 - Urine flow has slowed, suspected to be related to oxybutynin  use - Difficulty initiating urination, described as 'awful' and making it hard to urinate - Pain localized to the urethra - Intermittent chills, particularly when attempting to urinate - Intermittent fever and chills associated with urination - No worsening of baseline back pain and no new or radiating pain  Urinary tract infection and antimicrobial resistance - Treated in the ER on March 22, 2024, with IV and oral antibiotics; symptoms did not completely resolve - Urine culture a week ago was negative, but urine dip today showed leukocytes, nitrites, and blood - Previous urine cultures showed 10,000 colonies, insufficient for bacterial identification - ER urine culture identified Klebsiella pneumoniae -  History of resistance to multiple antibiotics including ciprofloxacin , nitrofurantoin , and trimethoprim-sulfamethoxazole (Bactrim); allergic to Bactrim - Keflex  provided partial symptom relief during previous use  Medication use and adverse effects - Currently taking oxybutynin  for urinary symptoms with no improvement and suspected adverse effect of slowed urine flow - Using vaginal estrogens - Recently stopped fluconazole  for a couple of days due to suspected urethral irritation; no fluconazole  remaining and requests refill for future symptoms    Patient Active Problem List   Diagnosis Date Noted   Iron  deficiency anemia 03/17/2024   Psoriatic arthritis (HCC) 02/20/2023   Total knee replacement status 04/29/2022   Arthritis of right hip 04/05/2022   Trochanteric bursitis of right hip 04/05/2022   Type 2 diabetes mellitus without complications (HCC) 04/01/2022   Primary osteoarthritis of left knee 02/10/2022   Panniculitis 06/12/2021   Primary osteoarthritis of right knee 10/26/2020   Dyslipidemia 07/24/2020   Cirrhosis of liver without ascites (HCC) 07/24/2020   Rotator cuff arthropathy of left shoulder 03/27/2020   Class 2 severe obesity with serious comorbidity and body mass index (BMI) of 36.0 to 36.9 in adult [facility]) 03/27/2020   Essential hypertension 03/27/2020   Type 2 diabetes mellitus with hyperglycemia, without long-term current use of insulin  (HCC) 03/15/2020   Chronic fatigue 03/25/2018   Chronic foot pain, right 06/10/2016      Current Outpatient Medications:    acetaminophen  (TYLENOL ) 500 MG tablet, Take 1,000 mg by mouth every 6 (six) hours as needed., Disp: , Rfl:    Blood Glucose Monitoring Suppl (BLOOD GLUCOSE MONITOR  SYSTEM) w/Device KIT, Use as directed, Disp: 1 kit, Rfl: 0   cephALEXin  (KEFLEX ) 500 MG capsule, Take 1 capsule (500 mg total) by mouth 3 (three) times daily for 7 days., Disp: 21 capsule, Rfl: 0   clotrimazole -betamethasone  (LOTRISONE ) cream, Apply  1 Application topically daily as needed., Disp: 45 g, Rfl: 3   diclofenac  (VOLTAREN ) 75 MG EC tablet, Take 1 tablet (75 mg total) by mouth 2 (two) times daily with a meal., Disp: 60 tablet, Rfl: 1   estradiol  (ESTRACE ) 1 MG tablet, Take 1 tablet (1 mg total) by mouth daily., Disp: 90 tablet, Rfl: 1   gabapentin  (NEURONTIN ) 100 MG capsule, Take 1 capsule (100 mg total) by mouth 2 (two) times daily. If no improvement after 5 days increase to 3 times daily, Disp: 60 capsule, Rfl: 11   glucose blood test strip, Use as directed., Disp: 100 each, Rfl: 0   hydrochlorothiazide  (HYDRODIURIL ) 12.5 MG tablet, Take 1 tablet (12.5 mg total) by mouth daily., Disp: 90 tablet, Rfl: 1   ipratropium (ATROVENT ) 0.06 % nasal spray, Place 2 sprays into both nostrils 3 (three) times daily as needed for Rhinitis, Disp: 15 mL, Rfl: 0   ketoconazole  (NIZORAL ) 2 % cream, Apply topically 2 (two) times daily To affected areas, use for up to 3-4 weeks, Disp: 30 g, Rfl: 2   lamoTRIgine  (LAMICTAL ) 100 MG tablet, Take 1 tablet (100 mg total) by mouth daily. (Patient taking differently: Take 50 mg by mouth daily.), Disp: 90 tablet, Rfl: 0   Lifitegrast  (XIIDRA ) 5 % SOLN, Place 1 drop into affected eyes 2 (two) times daily., Disp: 180 each, Rfl: 4   losartan  (COZAAR ) 50 MG tablet, Take 1 tablet (50 mg total) by mouth daily., Disp: 90 tablet, Rfl: 1   metFORMIN  (GLUCOPHAGE -XR) 500 MG 24 hr tablet, Take 1 tablet (500 mg total) by mouth 2 (two) times daily., Disp: 180 tablet, Rfl: 1   moxifloxacin (AVELOX) 400 MG tablet, Take 400 mg by mouth., Disp: , Rfl:    ondansetron  (ZOFRAN -ODT) 4 MG disintegrating tablet, Take 1 tablet (4 mg total) by mouth every 8 (eight) hours as needed for nausea or vomiting., Disp: 20 tablet, Rfl: 0   OneTouch UltraSoft 2 Lancets MISC, Use as directed., Disp: 100 each, Rfl: 0   pantoprazole  (PROTONIX ) 40 MG tablet, Take 1 tablet (40 mg total) by mouth daily., Disp: 90 tablet, Rfl: 3   QUEtiapine  (SEROQUEL ) 50  MG tablet, Take 1 tablet (50 mg total) by mouth at bedtime., Disp: 90 tablet, Rfl: 0   rosuvastatin  (CRESTOR ) 5 MG tablet, Take 1 tablet (5 mg total) by mouth daily., Disp: 90 tablet, Rfl: 1   Semaglutide , 1 MG/DOSE, (OZEMPIC , 1 MG/DOSE,) 4 MG/3ML SOPN, Inject 1 mg into the skin once a week., Disp: 9 mL, Rfl: 1   sertraline  (ZOLOFT ) 100 MG tablet, Take 2 tablets (200 mg total) by mouth daily with breakfast., Disp: 180 tablet, Rfl: 0   SKYRIZI PEN 150 MG/ML pen, Inject 150 mg into the skin. Unsure of dosing frequency, Disp: , Rfl:    tiZANidine  (ZANAFLEX ) 2 MG tablet, Take 1 tablet (2 mg total) by mouth every 8 (eight) hours as needed for Muscle spasms, Disp: 30 tablet, Rfl: 0   traZODone  (DESYREL ) 100 MG tablet, Take 1 tablet (100 mg total) by mouth at bedtime as needed., Disp: 90 tablet, Rfl: 0   triamcinolone  cream (KENALOG ) 0.1 %, Apply topically 2 (two) times daily., Disp: 30 g, Rfl: 1   verapamil  (CALAN -SR) 240  MG CR tablet, Take 1 tablet (240 mg total) by mouth daily., Disp: 90 tablet, Rfl: 1   fluconazole  (DIFLUCAN ) 150 MG tablet, Take 1 tablet (150 mg total) by mouth every 3 (three) days as needed (for vaginal itching/yeast infection sx)., Disp: 4 tablet, Rfl: 0   Allergies  Allergen Reactions   Codeine Other (See Comments)    Nausea and vomiting   Prednisone  Other (See Comments)    Makes her violent , patient reports only in high doses   Sulfa Antibiotics Hives     Social History   Tobacco Use   Smoking status: Never   Smokeless tobacco: Never  Vaping Use   Vaping status: Never Used  Substance Use Topics   Alcohol use: Not Currently    Comment: rarely   Drug use: Never      Chart Review Today: I personally reviewed active problem list, medication list, allergies, family history, social history, health maintenance, notes from last encounter, lab results, imaging with the patient/caregiver today.   Review of Systems  Constitutional:  Positive for chills.  HENT:  Negative.    Eyes: Negative.   Respiratory: Negative.    Cardiovascular: Negative.   Gastrointestinal: Negative.   Endocrine: Negative.   Genitourinary:  Positive for decreased urine volume and dysuria.  Musculoskeletal: Negative.   Skin: Negative.   Allergic/Immunologic: Negative.   Neurological: Negative.   Hematological: Negative.   Psychiatric/Behavioral: Negative.    All other systems reviewed and are negative.      Objective:   Vitals:   04/13/24 1108  BP: 122/68  Pulse: 79  Resp: 16  SpO2: 97%  Weight: 188 lb (85.3 kg)  Height: 5' (1.524 m)    Body mass index is 36.72 kg/m.  Physical Exam Vitals and nursing note reviewed.  Constitutional:      General: April Singleton is not in acute distress.    Appearance: Normal appearance. April Singleton is well-developed. April Singleton is obese. April Singleton is not ill-appearing, toxic-appearing or diaphoretic.  HENT:     Head: Normocephalic and atraumatic.     Right Ear: External ear normal.     Left Ear: External ear normal.     Nose: Nose normal.  Eyes:     General: No scleral icterus.       Right eye: No discharge.        Left eye: No discharge.     Conjunctiva/sclera: Conjunctivae normal.  Neck:     Trachea: No tracheal deviation.  Cardiovascular:     Rate and Rhythm: Normal rate.  Pulmonary:     Effort: Pulmonary effort is normal. No respiratory distress.     Breath sounds: No stridor.  Abdominal:     General: Abdomen is protuberant. Bowel sounds are normal.     Palpations: Abdomen is soft.     Tenderness: There is abdominal tenderness in the suprapubic area. There is no right CVA tenderness, left CVA tenderness, guarding or rebound.  Skin:    General: Skin is warm and dry.     Findings: No rash.  Neurological:     Mental Status: April Singleton is alert.     Motor: No abnormal muscle tone.     Coordination: Coordination normal.     Gait: Gait normal.  Psychiatric:        Mood and Affect: Mood normal.        Behavior: Behavior normal.      Results  for orders placed or performed in visit on 04/13/24  POCT Urinalysis Dipstick  Collection Time: 04/13/24 11:27 AM  Result Value Ref Range   Color, UA Yellow    Clarity, UA Clear    Glucose, UA Negative Negative   Bilirubin, UA Negative    Ketones, UA Negative    Spec Grav, UA 1.015 1.010 - 1.025   Blood, UA Moderate (A)    pH, UA 6.0 5.0 - 8.0   Protein, UA Positive (A) Negative   Urobilinogen, UA 0.2 0.2 or 1.0 E.U./dL   Nitrite, UA Large (A)    Leukocytes, UA Moderate (2+) (A) Negative   Appearance Normal    Odor None        Assessment & Plan:   1. Dysuria (Primary) Recurrent UTI vs IC? Suprapubic ttp and UTI sx Last week did not do antibiotic and waited for culture, now sx worse, will tx with empiric abx - see plan below - POCT Urinalysis Dipstick - Urine Culture - cephALEXin  (KEFLEX ) 500 MG capsule; Take 1 capsule (500 mg total) by mouth 3 (three) times daily for 7 days.  Dispense: 21 capsule; Refill: 0 - fluconazole  (DIFLUCAN ) 150 MG tablet; Take 1 tablet (150 mg total) by mouth every 3 (three) days as needed (for vaginal itching/yeast infection sx).  Dispense: 4 tablet; Refill: 0  2. Urinary frequency - Urine Culture - cephALEXin  (KEFLEX ) 500 MG capsule; Take 1 capsule (500 mg total) by mouth 3 (three) times daily for 7 days.  Dispense: 21 capsule; Refill: 0  3. Interstitial cystitis Interstitial cystitis may contribute to symptoms. Consider amitriptyline if urology consultation is delayed and no infection is present. Amitriptyline is initiated at a low dose and titrated as needed. Discussed alternative management strategies due to potential delay in urology appointment. - Consider starting amitriptyline if urology appointment is delayed and no infection is present.  4. Dysuria DIP positive for LE, nitrites, blood, tx empirically with keflex  per past C&S and antibiotic allergies  - POCT Urinalysis Dipstick - Urine Culture - cephALEXin  (KEFLEX ) 500 MG capsule; Take 1  capsule (500 mg total) by mouth 3 (three) times daily for 7 days.  Dispense: 21 capsule; Refill: 0 - fluconazole  (DIFLUCAN ) 150 MG tablet; Take 1 tablet (150 mg total) by mouth every 3 (three) days as needed (for vaginal itching/yeast infection sx).  Dispense: 4 tablet; Refill: 0  5. Recurrent UTI F/up urology - continue vaginal estrogen cream Recurrent Urinary Tract Infection (UTI) Continued urinary symptoms including bladder spasms, urinary frequency, and dysuria. Previous treatment with IV and oral antibiotics was ineffective. A recent urine culture was negative, but today's dip test shows leukocytes, nitrites, and blood, suggesting infection. Previous cultures identified Klebsiella pneumoniae. Oxybutynin  has been ineffective and may cause urinary retention. Keflex  is preferred based on past effectiveness and resistance patterns. Alternatives include amoxicillin  or Augmentin , but Cipro  is avoided due to side effects and resistance. Awaiting urology evaluation. - Stop oxybutynin . - Start Keflex  for UTI treatment. - Send urine for culture. - Follow up with urology referral.   Assessment & Plan   Interstitial Cystitis - see above plan    Vaginal Yeast Infection Antifungal treatment (pt doing herself - topical antifungal cream) was previously stopped due to urethral irritation. Plans to refill fluconazole  (Diflucan ) for future symptoms. - Refill fluconazole  (Diflucan ) prn for potential future use.  Follow-up Awaiting urology appointment following an urgent referral. Provided urology office contact information and will follow up with the referral team to ensure prompt scheduling. Discussed potential need to change referral location if delayed. - Provide urology office contact information. -  Follow up with the referral team regarding the urgent urology referral.  Recording duration: 13 minutes       Michelene Cower, PA-C 04/13/24 11:55 AM

## 2024-04-13 NOTE — Patient Instructions (Addendum)
 [facility] [place] [address] [place],  [place]  [address] [phone]

## 2024-04-15 LAB — URINE CULTURE
MICRO NUMBER:: 16702459
SPECIMEN QUALITY:: ADEQUATE

## 2024-04-16 ENCOUNTER — Other Ambulatory Visit: Payer: Self-pay

## 2024-04-16 ENCOUNTER — Ambulatory Visit: Payer: Self-pay | Admitting: Family Medicine

## 2024-04-16 DIAGNOSIS — N390 Urinary tract infection, site not specified: Secondary | ICD-10-CM

## 2024-04-16 MED ORDER — AMOXICILLIN-POT CLAVULANATE 875-125 MG PO TABS: 1.0000 | ORAL_TABLET | Freq: Two times a day (BID) | ORAL | 0 refills | Status: AC

## 2024-04-16 MED ORDER — AMOXICILLIN-POT CLAVULANATE 875-125 MG PO TABS
1.0000 | ORAL_TABLET | Freq: Two times a day (BID) | ORAL | 0 refills | Status: AC
Start: 2024-04-16 — End: 2024-04-23
  Filled 2024-04-16: qty 14, 7d supply, fill #0

## 2024-04-19 ENCOUNTER — Telehealth: Payer: Self-pay

## 2024-04-19 NOTE — Telephone Encounter (Signed)
 Copied from CRM [id number]. Topic: Clinical - Request for Lab/Test Order >> Apr 16, 2024  4:52 PM Turkey B wrote: Reason for CRM: I gave pt her lab results and the instructions with it for her UTI. Pt states will go pick med today >> Apr 16, 2024  4:56 PM Turkey B wrote: Pt also asked about the results of her ultrasound of pelvis. Please cb on this

## 2024-04-21 ENCOUNTER — Ambulatory Visit: Admitting: Urology

## 2024-04-21 ENCOUNTER — Encounter: Payer: Self-pay | Admitting: Urology

## 2024-04-21 ENCOUNTER — Other Ambulatory Visit: Payer: Self-pay

## 2024-04-21 VITALS — BP 134/80 | HR 80 | Temp 97.9°F | Ht 60.0 in | Wt 185.0 lb

## 2024-04-21 DIAGNOSIS — N390 Urinary tract infection, site not specified: Secondary | ICD-10-CM | POA: Diagnosis not present

## 2024-04-21 LAB — MICROSCOPIC EXAMINATION: Epithelial Cells (non renal): >10 /HPF — AB (ref 0–10)

## 2024-04-21 LAB — URINALYSIS, COMPLETE
Bilirubin, UA: NEGATIVE
Glucose, UA: NEGATIVE
Ketones, UA: NEGATIVE
Leukocytes,UA: NEGATIVE
Nitrite, UA: NEGATIVE
Protein,UA: NEGATIVE
RBC, UA: NEGATIVE
Specific Gravity, UA: 1.02 (ref 1.005–1.030)
Urobilinogen, Ur: 0.2 mg/dL (ref 0.2–1.0)
pH, UA: 6 (ref 5.0–7.5)

## 2024-04-21 LAB — BLADDER SCAN AMB NON-IMAGING: Scan Result: 0

## 2024-04-21 MED ORDER — DOXYCYCLINE HYCLATE 50 MG PO CAPS
50.0000 mg | ORAL_CAPSULE | Freq: Every day | ORAL | 2 refills | Status: DC
  Filled 2024-04-21: qty 30, 30d supply, fill #0
  Filled 2024-05-14: qty 30, 30d supply, fill #1
  Filled 2024-06-11: qty 30, 30d supply, fill #2

## 2024-04-21 NOTE — Progress Notes (Signed)
 I, Maysun L Gibbs,acting as a scribe for Glendia JAYSON Barba, MD., have documented all relevant documentation on the behalf of Glendia JAYSON Barba, MD, as directed by Glendia JAYSON Barba, MD while in the presence of Glendia JAYSON Barba, MD.  04/21/2024 3:45 PM   April Singleton 04-24-1951 [id number]  Referring provider: Leavy Mole, PA-C [address] [place],  [place] [address]  Chief Complaint  Patient presents with   Recurrent UTI    HPI: April Singleton is a 73 y.o. female referred for evaluation of recurrent UTIs.   States she has had recurrent UTIs over the past month. Initially seen in [facility] acute care 03/11/24 with a 3-4 day history of dysuria, low back pain, fever to 100.8. Urinalysis with negative dipstick and microscopy showing less than <1 WBC and 1 RBC. She was started on Cefdinir  300 mg twice daily for 10 days, and urine culture subsequently returned without significant growth.  ED visit 03/22/24 complaining of low back pain, low grade fever and headaches. Urinalysis had no significant RBCs or WBCs. She was treated empirically with a dose of ivory-receiving Keflex . Urine culture did grow 100,000 colonies of Klebsiella. CT abdomen pelvis with contrast was performed, which showed a non-obstructing 4 mm right lower pole stone, and no hydropnephrosis or ureteral calculi. She states her symptoms include urinary frequency, urgency, dysuria. She also complains of vaginal burning and itching. She was prescribed oxybutynin , however had difficulty urinating and subsequently stopped this medication.  Her symptoms improved after antibiotics then return.  Saw her PCP 04/13/24 with recurrent symptoms, urine culture subsequently grew greater than 100,000 colonies of Klebsiella, which was a multi-drug resistant. She was prescribed a prescription augmentin , and has 2 more days left. Her symptoms have improved, but not resolved.    PMH: Past Medical History:  Diagnosis Date    Anemia    Arthritis    Asthma    Chronic kidney disease    Depression    Diabetes mellitus without complication (HCC)    GERD (gastroesophageal reflux disease)    History of kidney stones    Hypertension    Pneumonia    PONV (postoperative nausea and vomiting)     Surgical History: Past Surgical History:  Procedure Laterality Date   ABDOMINAL HYSTERECTOMY     BREAST BIOPSY     Pt unsure of type of biopsy or even of which side   KNEE ARTHROPLASTY Left 04/29/2022   Procedure: COMPUTER ASSISTED TOTAL KNEE ARTHROPLASTY;  Surgeon: Mardee Lynwood SQUIBB, MD;  Location: [facility] ORS;  Service: Orthopedics;  Laterality: Left;   REDUCTION MAMMAPLASTY     TONSILLECTOMY      Home Medications:  Allergies as of 04/21/2024       Reactions   Codeine Other (See Comments)   Nausea and vomiting   Prednisone  Other (See Comments)   Makes her violent , patient reports only in high doses   Sulfa Antibiotics Hives        Medication List        Accurate as of April 21, 2024  3:45 PM. If you have any questions, ask your nurse or doctor.          acetaminophen  500 MG tablet Commonly known as: TYLENOL  Take 1,000 mg by mouth every 6 (six) hours as needed.   amoxicillin -clavulanate 875-125 MG tablet Commonly known as: AUGMENTIN  Take 1 tablet by mouth 2 (two) times daily for 7 days.   amoxicillin -clavulanate 875-125 MG tablet Commonly  known as: AUGMENTIN  Take 1 tablet by mouth 2 (two) times daily for 7 days.   clotrimazole -betamethasone  cream Commonly known as: LOTRISONE  Apply 1 Application topically daily as needed.   diclofenac  75 MG EC tablet Commonly known as: VOLTAREN  Take 1 tablet (75 mg total) by mouth 2 (two) times daily with a meal.   doxycycline  50 MG capsule Commonly known as: VIBRAMYCIN  Take 1 capsule (50 mg total) by mouth daily. Started by: Glendia JAYSON Barba   estradiol  1 MG tablet Commonly known as: Estrace  Take 1 tablet (1 mg total) by mouth daily.   fluconazole  150  MG tablet Commonly known as: DIFLUCAN  Take 1 tablet (150 mg total) by mouth every 3 (three) days as needed (for vaginal itching/yeast infection sx).   gabapentin  100 MG capsule Commonly known as: NEURONTIN  Take 1 capsule (100 mg total) by mouth 2 (two) times daily. If no improvement after 5 days increase to 3 times daily   hydrochlorothiazide  12.5 MG tablet Commonly known as: HYDRODIURIL  Take 1 tablet (12.5 mg total) by mouth daily.   ipratropium 0.06 % nasal spray Commonly known as: ATROVENT  Place 2 sprays into both nostrils 3 (three) times daily as needed for Rhinitis   ketoconazole  2 % cream Commonly known as: NIZORAL  Apply topically 2 (two) times daily To affected areas, use for up to 3-4 weeks   lamoTRIgine  100 MG tablet Commonly known as: LaMICtal  Take 1 tablet (100 mg total) by mouth daily. What changed: how much to take   losartan  50 MG tablet Commonly known as: COZAAR  Take 1 tablet (50 mg total) by mouth daily.   metFORMIN  500 MG 24 hr tablet Commonly known as: GLUCOPHAGE -XR Take 1 tablet (500 mg total) by mouth 2 (two) times daily.   moxifloxacin 400 MG tablet Commonly known as: AVELOX Take 400 mg by mouth.   ondansetron  4 MG disintegrating tablet Commonly known as: ZOFRAN -ODT Take 1 tablet (4 mg total) by mouth every 8 (eight) hours as needed for nausea or vomiting.   OneTouch UltraSoft 2 Lancets Misc Use as directed.   OneTouch Verio Flex System w/Device Kit Use as directed   OneTouch Verio test strip Generic drug: glucose blood Use as directed.   Ozempic  (1 MG/DOSE) 4 MG/3ML Sopn Generic drug: Semaglutide  (1 MG/DOSE) Inject 1 mg into the skin once a week.   pantoprazole  40 MG tablet Commonly known as: Protonix  Take 1 tablet (40 mg total) by mouth daily.   QUEtiapine  50 MG tablet Commonly known as: SEROquel  Take 1 tablet (50 mg total) by mouth at bedtime.   rosuvastatin  5 MG tablet Commonly known as: Crestor  Take 1 tablet (5 mg total) by  mouth daily.   sertraline  100 MG tablet Commonly known as: Zoloft  Take 2 tablets (200 mg total) by mouth daily with breakfast.   Skyrizi Pen 150 MG/ML pen Generic drug: risankizumab-rzaa Inject 150 mg into the skin. Unsure of dosing frequency   tiZANidine  2 MG tablet Commonly known as: ZANAFLEX  Take 1 tablet (2 mg total) by mouth every 8 (eight) hours as needed for muscle spasms (Take 1 tablet (2 mg total) by mouth every 8 (eight) hours as needed for Muscle spasms)   traZODone  100 MG tablet Commonly known as: DESYREL  Take 1 tablet (100 mg total) by mouth at bedtime as needed.   triamcinolone  cream 0.1 % Commonly known as: KENALOG  Apply topically 2 (two) times daily.   verapamil  240 MG CR tablet Commonly known as: CALAN -SR Take 1 tablet (240 mg total) by mouth  daily.   Xiidra  5 % Soln Generic drug: Lifitegrast  Place 1 drop into affected eyes 2 (two) times daily.        Allergies:  Allergies  Allergen Reactions   Codeine Other (See Comments)    Nausea and vomiting   Prednisone  Other (See Comments)    Makes her violent , patient reports only in high doses   Sulfa Antibiotics Hives    Family History: Family History  Problem Relation Age of Onset   Lung cancer Mother    Hypertension Mother    Congestive Heart Failure Mother    Liver cancer Father    Lung cancer Father    Breast cancer Maternal Aunt    Breast cancer Maternal Aunt     Social History:  reports that she has never smoked. She has never used smokeless tobacco. She reports that she does not currently use alcohol. She reports that she does not use drugs.   Physical Exam: BP 134/80   Pulse 80   Temp 97.9 F (36.6 C) (Oral)   Ht 5' (1.524 m)   Wt 185 lb (83.9 kg)   SpO2 97%   BMI 36.13 kg/m   Constitutional:  Alert and oriented, No acute distress. HEENT: [place] AT Respiratory: Normal respiratory effort, no increased work of breathing. Psychiatric: Normal mood and  affect.   Urinalysis Dipstick/microscopy negative   Assessment & Plan:    2 cultures positive for Klebsiella within the past 3 weeks; most likely persistent versus recurrent infections. Urinalysis today clear.  Will start on low dose antibiotic prophylaxis once she completes her course of augmentin , Rx doxycycline  50 mg daily sent to pharmacy.  PVR today 0 mL Follow-up 2 months and instructed to call earlier for recurrent symptoms.   I have reviewed the above documentation for accuracy and completeness, and I agree with the above.   Glendia JAYSON Barba, MD  [facility] [address] [place], [place] [address] [phone]

## 2024-04-22 ENCOUNTER — Ambulatory Visit

## 2024-04-26 ENCOUNTER — Other Ambulatory Visit: Payer: Self-pay

## 2024-04-26 MED ORDER — SILVER SULFADIAZINE 1 % EX CREA
1.0000 | TOPICAL_CREAM | Freq: Every day | CUTANEOUS | 1 refills | Status: DC
  Filled 2024-04-26: qty 100, 100d supply, fill #0
  Filled 2024-07-30: qty 50, 50d supply, fill #1
  Filled 2024-10-05: qty 50, 50d supply, fill #2

## 2024-05-05 ENCOUNTER — Telehealth: Payer: Self-pay

## 2024-05-05 NOTE — Telephone Encounter (Signed)
 Copied from CRM [id number]. Topic: General - Call Back - No Documentation >> May 05, 2024 10:05 AM Deleta RAMAN wrote: Reason for CRM: patient received call from the office please follow up with the patient.

## 2024-05-06 ENCOUNTER — Other Ambulatory Visit: Payer: Self-pay

## 2024-05-06 MED ORDER — TRAZODONE HCL 100 MG PO TABS
100.0000 mg | ORAL_TABLET | Freq: Every evening | ORAL | 0 refills | Status: DC | PRN
  Filled 2024-05-06 – 2024-05-17 (×2): qty 90, 90d supply, fill #0

## 2024-05-06 MED ORDER — SERTRALINE HCL 100 MG PO TABS
200.0000 mg | ORAL_TABLET | Freq: Every day | ORAL | 0 refills | Status: DC
  Filled 2024-05-06 – 2024-05-17 (×2): qty 180, 90d supply, fill #0

## 2024-05-06 MED ORDER — QUETIAPINE FUMARATE 25 MG PO TABS
25.0000 mg | ORAL_TABLET | Freq: Every day | ORAL | 0 refills | Status: DC
  Filled 2024-05-06 – 2024-05-17 (×2): qty 90, 90d supply, fill #0

## 2024-05-06 MED ORDER — LAMOTRIGINE 100 MG PO TABS
100.0000 mg | ORAL_TABLET | Freq: Every day | ORAL | 0 refills | Status: AC
  Filled 2024-05-06 – 2024-05-17 (×2): qty 90, 90d supply, fill #0

## 2024-05-12 ENCOUNTER — Ambulatory Visit: Admitting: Urology

## 2024-05-13 LAB — HM DIABETES EYE EXAM

## 2024-05-14 ENCOUNTER — Other Ambulatory Visit: Payer: Self-pay

## 2024-05-16 ENCOUNTER — Other Ambulatory Visit: Payer: Self-pay

## 2024-05-17 ENCOUNTER — Other Ambulatory Visit: Payer: Self-pay

## 2024-05-17 ENCOUNTER — Encounter: Payer: Self-pay | Admitting: Cardiology

## 2024-05-17 ENCOUNTER — Ambulatory Visit: Attending: Cardiology | Admitting: Cardiology

## 2024-05-17 VITALS — BP 126/68 | HR 77 | Ht 60.0 in | Wt 187.8 lb

## 2024-05-17 DIAGNOSIS — I2089 Other forms of angina pectoris: Secondary | ICD-10-CM | POA: Diagnosis not present

## 2024-05-17 DIAGNOSIS — R011 Cardiac murmur, unspecified: Secondary | ICD-10-CM | POA: Diagnosis not present

## 2024-05-17 DIAGNOSIS — E782 Mixed hyperlipidemia: Secondary | ICD-10-CM

## 2024-05-17 DIAGNOSIS — R0609 Other forms of dyspnea: Secondary | ICD-10-CM

## 2024-05-17 DIAGNOSIS — K551 Chronic vascular disorders of intestine: Secondary | ICD-10-CM | POA: Diagnosis not present

## 2024-05-17 MED ORDER — METOPROLOL TARTRATE 100 MG PO TABS
ORAL_TABLET | ORAL | 0 refills | Status: DC
  Filled 2024-05-17: qty 1, 1d supply, fill #0

## 2024-05-17 MED ORDER — EZETIMIBE 10 MG PO TABS
10.0000 mg | ORAL_TABLET | Freq: Every day | ORAL | 3 refills | Status: AC
  Filled 2024-05-17: qty 90, 90d supply, fill #0
  Filled 2024-07-30: qty 90, 90d supply, fill #1
  Filled 2024-10-05 – 2024-10-06 (×2): qty 90, 90d supply, fill #2

## 2024-05-17 MED ORDER — DICLOFENAC SODIUM 75 MG PO TBEC
75.0000 mg | DELAYED_RELEASE_TABLET | Freq: Two times a day (BID) | ORAL | 1 refills | Status: DC
  Filled 2024-05-17: qty 60, 30d supply, fill #0
  Filled 2024-06-11: qty 60, 30d supply, fill #1

## 2024-05-17 NOTE — Progress Notes (Signed)
 Cardiology Office Note:    Date:  05/17/2024   ID:  VERTA RIEDLINGER, DOB [birth year], MRN [id number]  PCP:  April Mole, PA-C   [facility] Providers Cardiologist:  None     Referring MD: April Mole, PA-C   Chief Complaint  Patient presents with   Establish Care    New pt has been doing well with no complaints of chest pain, chest pressure or SOB, medciation reviewed verbally with patient   April Singleton is a 73 y.o. female who is being seen today for the evaluation of systolic murmur at the request of April Mole, PA-C.   History of Present Illness:    April Singleton is a 73 y.o. female with a hx of hypertension, diabetes, iron  deficiency anemia presenting due to cardiac murmur.  Patient is from primary care physician for scheduled visit, cardiac murmur noted on exam.  She endorses shortness of breath with minimal exertion, sometimes shortness of breath at rest.  Also endorses fatigue, hypersomnolence, sleeps for about 10 to 11 hours at night/day.  Had a sleep study done 2 years ago which was normal.  Easily bruises with aspirin.  Also has low iron  levels, being seen by hematology.  Had an abdominal CT on 6/25 which showed abdominal aortic calcifications, significant stenosis of superior mesenteric artery.  Past Medical History:  Diagnosis Date   Anemia    Arthritis    Asthma    Chronic kidney disease    Depression    Diabetes mellitus without complication (HCC)    GERD (gastroesophageal reflux disease)    History of kidney stones    Hypertension    Pneumonia    PONV (postoperative nausea and vomiting)     Past Surgical History:  Procedure Laterality Date   ABDOMINAL HYSTERECTOMY     BREAST BIOPSY     Pt unsure of type of biopsy or even of which side   KNEE ARTHROPLASTY Left 04/29/2022   Procedure: COMPUTER ASSISTED TOTAL KNEE ARTHROPLASTY;  Surgeon: Mardee Lynwood SQUIBB, MD;  Location: [facility] ORS;  Service: Orthopedics;  Laterality: Left;   REDUCTION  MAMMAPLASTY     TONSILLECTOMY      Current Medications: Current Meds  Medication Sig   acetaminophen  (TYLENOL ) 500 MG tablet Take 1,000 mg by mouth every 6 (six) hours as needed.   Blood Glucose Monitoring Suppl (BLOOD GLUCOSE MONITOR SYSTEM) w/Device KIT Use as directed   clotrimazole -betamethasone  (LOTRISONE ) cream Apply 1 Application topically daily as needed.   diclofenac  (VOLTAREN ) 75 MG EC tablet Take 1 tablet (75 mg total) by mouth 2 (two) times daily with a meal.   doxycycline  (VIBRAMYCIN ) 50 MG capsule Take 1 capsule (50 mg total) by mouth daily.   estradiol  (ESTRACE ) 1 MG tablet Take 1 tablet (1 mg total) by mouth daily.   ezetimibe  (ZETIA ) 10 MG tablet Take 1 tablet (10 mg total) by mouth daily.   fluconazole  (DIFLUCAN ) 150 MG tablet Take 1 tablet (150 mg total) by mouth every 3 (three) days as needed (for vaginal itching/yeast infection sx).   gabapentin  (NEURONTIN ) 100 MG capsule Take 1 capsule (100 mg total) by mouth 2 (two) times daily. If no improvement after 5 days increase to 3 times daily   glucose blood test strip Use as directed.   hydrochlorothiazide  (HYDRODIURIL ) 12.5 MG tablet Take 1 tablet (12.5 mg total) by mouth daily.   ipratropium (ATROVENT ) 0.06 % nasal spray Place 2 sprays into both nostrils 3 (three) times daily as needed for  Rhinitis   ketoconazole  (NIZORAL ) 2 % cream Apply topically 2 (two) times daily To affected areas, use for up to 3-4 weeks   lamoTRIgine  (LAMICTAL ) 100 MG tablet Take 1 tablet (100 mg total) by mouth daily. (Patient taking differently: Take 50 mg by mouth daily.)   lamoTRIgine  (LAMICTAL ) 100 MG tablet Take 1 tablet (100 mg total) by mouth daily.   Lifitegrast  (XIIDRA ) 5 % SOLN Place 1 drop into affected eyes 2 (two) times daily.   losartan  (COZAAR ) 50 MG tablet Take 1 tablet (50 mg total) by mouth daily.   metFORMIN  (GLUCOPHAGE -XR) 500 MG 24 hr tablet Take 1 tablet (500 mg total) by mouth 2 (two) times daily.   metoprolol  tartrate  (LOPRESSOR ) 100 MG tablet TAKE 1 TABLET 2 HR PRIOR TO CARDIAC PROCEDURE   moxifloxacin (AVELOX) 400 MG tablet Take 400 mg by mouth.   ondansetron  (ZOFRAN -ODT) 4 MG disintegrating tablet Take 1 tablet (4 mg total) by mouth every 8 (eight) hours as needed for nausea or vomiting.   OneTouch UltraSoft 2 Lancets MISC Use as directed.   pantoprazole  (PROTONIX ) 40 MG tablet Take 1 tablet (40 mg total) by mouth daily.   QUEtiapine  (SEROQUEL ) 25 MG tablet Take 1 tablet (25 mg total) by mouth at bedtime.   QUEtiapine  (SEROQUEL ) 50 MG tablet Take 1 tablet (50 mg total) by mouth at bedtime.   rosuvastatin  (CRESTOR ) 5 MG tablet Take 1 tablet (5 mg total) by mouth daily.   Semaglutide , 1 MG/DOSE, (OZEMPIC , 1 MG/DOSE,) 4 MG/3ML SOPN Inject 1 mg into the skin once a week.   sertraline  (ZOLOFT ) 100 MG tablet Take 2 tablets (200 mg total) by mouth daily with breakfast.   sertraline  (ZOLOFT ) 100 MG tablet Take 2 tablets (200 mg total) by mouth daily with breakfast.   silver  sulfADIAZINE  (SILVADENE ) 1 % cream Apply 1 Application topically daily.   SKYRIZI PEN 150 MG/ML pen Inject 150 mg into the skin. Unsure of dosing frequency   tiZANidine  (ZANAFLEX ) 2 MG tablet Take 1 tablet (2 mg total) by mouth every 8 (eight) hours as needed for Muscle spasms   traZODone  (DESYREL ) 100 MG tablet Take 1 tablet (100 mg total) by mouth at bedtime as needed.   traZODone  (DESYREL ) 100 MG tablet Take 1 tablet (100 mg total) by mouth at bedtime as needed.   triamcinolone  cream (KENALOG ) 0.1 % Apply topically 2 (two) times daily.   verapamil  (CALAN -SR) 240 MG CR tablet Take 1 tablet (240 mg total) by mouth daily.     Allergies:   Codeine, Prednisone , and Sulfa antibiotics   Social History   Socioeconomic History   Marital status: Married    Spouse name: April Singleton   Number of children: 3   Years of education: Not on file   Highest education level: Not on file  Occupational History   Not on file  Tobacco Use   Smoking status:  Never   Smokeless tobacco: Never  Vaping Use   Vaping status: Never Used  Substance and Sexual Activity   Alcohol use: Not Currently    Comment: rarely   Drug use: Never   Sexual activity: Yes    Birth control/protection: Surgical    Comment: Hysterectomy  Other Topics Concern   Not on file  Social History Narrative   3 grandchildren and daughter in the home   Social Drivers of Health   Financial Resource Strain: Low Risk  (03/08/2024)   Overall Financial Resource Strain (CARDIA)    Difficulty of Paying Living  Expenses: Not hard at all  Food Insecurity: No Food Insecurity (03/30/2024)   Hunger Vital Sign    Worried About Running Out of Food in the Last Year: Never true    Ran Out of Food in the Last Year: Never true  Transportation Needs: No Transportation Needs (03/30/2024)   PRAPARE - Administrator, Civil Service (Medical): No    Lack of Transportation (Non-Medical): No  Physical Activity: Sufficiently Active (03/08/2024)   Exercise Vital Sign    Days of Exercise per Week: 7 days    Minutes of Exercise per Session: 30 min  Stress: No Stress Concern Present (03/08/2024)   [employer] of Occupational Health - Occupational Stress Questionnaire    Feeling of Stress : Not at all  Social Connections: Moderately Integrated (03/08/2024)   Social Connection and Isolation Panel    Frequency of Communication with Friends and Family: More than three times a week    Frequency of Social Gatherings with Friends and Family: More than three times a week    Attends Religious Services: More than 4 times per year    Active Member of [employer] or Organizations: No    Attends Banker Meetings: Never    Marital Status: Married     Family History: The patient's family history includes Breast cancer in her maternal aunt and maternal aunt; Congestive Heart Failure in her mother; Fainting in her maternal grandfather; Heart disease in her maternal grandfather and mother; Heart  failure in her maternal grandmother; Hypertension in her brother, father, maternal uncle, mother, paternal aunt, and sister; Liver cancer in her father; Lung cancer in her father and mother.  ROS:   Please see the history of present illness.     All other systems reviewed and are negative.  EKGs/Labs/Other Studies Reviewed:    The following studies were reviewed today:  EKG Interpretation Date/Time:  Monday May 17 2024 13:33:37 EDT Ventricular Rate:  77 PR Interval:  190 QRS Duration:  84 QT Interval:  392 QTC Calculation: 443 R Axis:   -5  Text Interpretation: Normal sinus rhythm Normal ECG Confirmed by Darliss Rogue ([id number]) on 05/17/2024 1:42:33 PM    Recent Labs: 03/22/2024: ALT 19; BUN 20; Creatinine, Ser 0.78; Hemoglobin 10.3; Platelets 242; Potassium 3.7; Sodium 137  Recent Lipid Panel    Component Value Date/Time   CHOL 193 03/08/2024 1428   TRIG 211 (H) 03/08/2024 1428   HDL 88 03/08/2024 1428   CHOLHDL 2.2 03/08/2024 1428   VLDL 77 (H) 03/16/2018 1228   LDLCALC 74 03/08/2024 1428     Risk Assessment/Calculations:             Physical Exam:    VS:  BP 126/68 (BP Location: Left Arm, Patient Position: Standing)   Pulse 77   Ht 5' (1.524 m)   Wt 187 lb 12.8 oz (85.2 kg)   SpO2 98%   BMI 36.68 kg/m     Wt Readings from Last 3 Encounters:  05/17/24 187 lb 12.8 oz (85.2 kg)  04/21/24 185 lb (83.9 kg)  04/13/24 188 lb (85.3 kg)     GEN:  Well nourished, well developed in no acute distress HEENT: Normal NECK: No JVD; No carotid bruits CARDIAC: RRR, 2/6 systolic murmur RESPIRATORY:  Clear to auscultation without rales, wheezing or rhonchi  ABDOMEN: Soft, non-tender, non-distended MUSCULOSKELETAL:  No edema; No deformity  SKIN: Warm and dry NEUROLOGIC:  Alert and oriented x 3 PSYCHIATRIC:  Normal affect   ASSESSMENT:  1. Murmur, cardiac   2. Dyspnea on exertion   3. Superior mesenteric artery stenosis (HCC)   4. Mixed hyperlipidemia   5.  Anginal equivalent (HCC)    PLAN:    In order of problems listed above:  Systolic murmur on exam.  Echocardiogram already scheduled.  Advised to obtain.  Monitor for any significant valvular or structural abnormalities. Dyspnea on exertion.  This could be an anginal equivalent.  Echo was scheduled.  Obtain coronary CTA to rule out CAD. SMA stenosis noted on abdominal CT.  Refer to peripheral vascular clinic.  Holding antiplatelet due to easy bruisability, iron  deficiency anemia.  Continue Crestor  5 mg daily, start Zetia  10 g daily. Hyperlipidemia, start Zetia  10 mg daily, continue Crestor  5 mg daily.  Does not tolerate higher doses of Crestor .  Follow-up after cardiac testing     Medication Adjustments/Labs and Tests Ordered: Current medicines are reviewed at length with the patient today.  Concerns regarding medicines are outlined above.  Orders Placed This Encounter  Procedures   CT CORONARY MORPH W/CTA COR W/SCORE W/CA W/CM &/OR WO/CM   Basic metabolic panel with GFR   Ambulatory Referral for Peripheral Vascular Consult   EKG 12-Lead   Meds ordered this encounter  Medications   ezetimibe  (ZETIA ) 10 MG tablet    Sig: Take 1 tablet (10 mg total) by mouth daily.    Dispense:  90 tablet    Refill:  3   metoprolol  tartrate (LOPRESSOR ) 100 MG tablet    Sig: TAKE 1 TABLET 2 HR PRIOR TO CARDIAC PROCEDURE    Dispense:  1 tablet    Refill:  0    Patient Instructions  Medication Instructions:  - START zetia  10 mg daily  - Take ONE 100 mg metoprolol  two hours prior to cardiac CT   *If you need a refill on your cardiac medications before your next appointment, please call your pharmacy*  Lab Work: Your provider would like for you to have following labs drawn today BMP.    If you have labs (blood work) drawn today and your tests are completely normal, you will receive your results only by: MyChart Message (if you have MyChart) OR A paper copy in the mail If you have any lab test  that is abnormal or we need to change your treatment, we will call you to review the results.  Testing/Procedures:   Your cardiac CT will be scheduled at:  [facility] [address] [place], [place] [address] [phone]  Please arrive 15 mins early for check-in and test prep.  There is spacious parking and easy access to the radiology department from the [facility] entrance. Please enter here and check-in with the desk attendant.    Please follow these instructions carefully (unless otherwise directed):  An IV will be required for this test and Nitroglycerin will be given.  Hold all erectile dysfunction medications at least 3 days (72 hrs) prior to test. (Ie viagra, cialis, sildenafil, tadalafil, etc)     On the Night Before the Test: Be sure to Drink plenty of water. Do not consume any caffeinated/decaffeinated beverages or chocolate 12 hours prior to your test. Do not take any antihistamines 12 hours prior to your test.  On the Day of the Test: Drink plenty of water until 1 hour prior to the test. Do not eat any food 1 hour prior to test. You may take your regular medications prior to the test.  Take metoprolol  (  Lopressor ) two hours prior to test. If you take Furosemide/Hydrochlorothiazide /Spironolactone/Chlorthalidone, please HOLD on the morning of the test. Patients who wear a continuous glucose monitor MUST remove the device prior to scanning. FEMALES- please wear underwire-free bra if available, avoid dresses & tight clothing       After the Test: Drink plenty of water. After receiving IV contrast, you may experience a mild flushed feeling. This is normal. On occasion, you may experience a mild rash up to 24 hours after the test. This is not dangerous. If this occurs, you can take Benadryl  25 mg, Zyrtec, Claritin, or Allegra and increase your fluid intake. (Patients taking Tikosyn should avoid Benadryl , and may take Zyrtec,  Claritin, or Allegra) If you experience trouble breathing, this can be serious. If it is severe call 911 IMMEDIATELY. If it is mild, please call our office.  We will call to schedule your test 2-4 weeks out understanding that some insurance companies will need an authorization prior to the service being performed.   For more information and frequently asked questions, please visit our website : [email or web address]  For non-scheduling related questions, please contact the cardiac imaging nurse navigator should you have any questions/concerns: Cardiac Imaging Nurse Navigators Direct Office Dial: [phone]   For scheduling needs, including cancellations and rescheduling, please call [place], [phone].    Follow-Up: At [facility], you and your health needs are our priority.  As part of our continuing mission to provide you with exceptional heart care, our providers are all part of one team.  This team includes your primary Cardiologist (physician) and Advanced Practice Providers or APPs (Physician Assistants and Nurse Practitioners) who all work together to provide you with the care you need, when you need it.  Your next appointment:   6 week(s)  Provider:   You may see Dr. Darliss or one of the following Advanced Practice Providers on your designated Care Team:   Lonni Meager, NP Lesley Maffucci, PA-C Bernardino Bring, PA-C Cadence [place], PA-C Tylene Lunch, NP Barnie Hila, NP    We recommend signing up for the patient portal called MyChart.  Sign up information is provided on this After Visit Summary.  MyChart is used to connect with patients for Virtual Visits (Telemedicine).  Patients are able to view lab/test results, encounter notes, upcoming appointments, etc.  Non-urgent messages can be sent to your provider as well.   To learn more about what you can do with MyChart, go to [email or web address].         Signed, Redell Darliss, MD   05/17/2024 3:06 PM    [facility]

## 2024-05-17 NOTE — Patient Instructions (Signed)
 Medication Instructions:  - START zetia  10 mg daily  - Take ONE 100 mg metoprolol  two hours prior to cardiac CT   *If you need a refill on your cardiac medications before your next appointment, please call your pharmacy*  Lab Work: Your provider would like for you to have following labs drawn today BMP.    If you have labs (blood work) drawn today and your tests are completely normal, you will receive your results only by: MyChart Message (if you have MyChart) OR A paper copy in the mail If you have any lab test that is abnormal or we need to change your treatment, we will call you to review the results.  Testing/Procedures:   Your cardiac CT will be scheduled at:  [facility] [address] [place], [place] [address] [phone]  Please arrive 15 mins early for check-in and test prep.  There is spacious parking and easy access to the radiology department from the [facility] entrance. Please enter here and check-in with the desk attendant.    Please follow these instructions carefully (unless otherwise directed):  An IV will be required for this test and Nitroglycerin will be given.  Hold all erectile dysfunction medications at least 3 days (72 hrs) prior to test. (Ie viagra, cialis, sildenafil, tadalafil, etc)     On the Night Before the Test: Be sure to Drink plenty of water. Do not consume any caffeinated/decaffeinated beverages or chocolate 12 hours prior to your test. Do not take any antihistamines 12 hours prior to your test.  On the Day of the Test: Drink plenty of water until 1 hour prior to the test. Do not eat any food 1 hour prior to test. You may take your regular medications prior to the test.  Take metoprolol  (Lopressor ) two hours prior to test. If you take Furosemide/Hydrochlorothiazide /Spironolactone/Chlorthalidone, please HOLD on the morning of the test. Patients who wear a continuous glucose monitor MUST remove  the device prior to scanning. FEMALES- please wear underwire-free bra if available, avoid dresses & tight clothing       After the Test: Drink plenty of water. After receiving IV contrast, you may experience a mild flushed feeling. This is normal. On occasion, you may experience a mild rash up to 24 hours after the test. This is not dangerous. If this occurs, you can take Benadryl  25 mg, Zyrtec, Claritin, or Allegra and increase your fluid intake. (Patients taking Tikosyn should avoid Benadryl , and may take Zyrtec, Claritin, or Allegra) If you experience trouble breathing, this can be serious. If it is severe call 911 IMMEDIATELY. If it is mild, please call our office.  We will call to schedule your test 2-4 weeks out understanding that some insurance companies will need an authorization prior to the service being performed.   For more information and frequently asked questions, please visit our website : [email or web address]  For non-scheduling related questions, please contact the cardiac imaging nurse navigator should you have any questions/concerns: Cardiac Imaging Nurse Navigators Direct Office Dial: [phone]   For scheduling needs, including cancellations and rescheduling, please call [place], [phone].    Follow-Up: At [facility], you and your health needs are our priority.  As part of our continuing mission to provide you with exceptional heart care, our providers are all part of one team.  This team includes your primary Cardiologist (physician) and Advanced Practice Providers or APPs (Physician Assistants and Nurse Practitioners) who all work together to provide you  with the care you need, when you need it.  Your next appointment:   6 week(s)  Provider:   You may see Dr. Darliss or one of the following Advanced Practice Providers on your designated Care Team:   Lonni Meager, NP Lesley Maffucci, PA-C Bernardino Bring, PA-C Cadence [place],  PA-C Tylene Lunch, NP Barnie Hila, NP    We recommend signing up for the patient portal called MyChart.  Sign up information is provided on this After Visit Summary.  MyChart is used to connect with patients for Virtual Visits (Telemedicine).  Patients are able to view lab/test results, encounter notes, upcoming appointments, etc.  Non-urgent messages can be sent to your provider as well.   To learn more about what you can do with MyChart, go to [email or web address].

## 2024-05-18 LAB — BASIC METABOLIC PANEL WITH GFR
BUN/Creatinine Ratio: 18 (ref 12–28)
BUN: 19 mg/dL (ref 8–27)
CO2: 22 mmol/L (ref 20–29)
Calcium: 9.3 mg/dL (ref 8.7–10.3)
Chloride: 101 mmol/L (ref 96–106)
Creatinine, Ser: 1.04 mg/dL — ABNORMAL HIGH (ref 0.57–1.00)
Glucose: 148 mg/dL — ABNORMAL HIGH (ref 70–99)
Potassium: 4.5 mmol/L (ref 3.5–5.2)
Sodium: 137 mmol/L (ref 134–144)
eGFR: 57 mL/min/1.73 — ABNORMAL LOW (ref >59)

## 2024-05-25 ENCOUNTER — Ambulatory Visit
Admission: RE | Admit: 2024-05-25 | Discharge: 2024-05-25 | Disposition: A | Discharge status: Home / Self Care | Source: Ambulatory Visit | Attending: Family Medicine | Admitting: Family Medicine

## 2024-05-25 DIAGNOSIS — R5383 Other fatigue: Secondary | ICD-10-CM | POA: Diagnosis not present

## 2024-05-25 DIAGNOSIS — I358 Other nonrheumatic aortic valve disorders: Secondary | ICD-10-CM | POA: Insufficient documentation

## 2024-05-25 DIAGNOSIS — I517 Cardiomegaly: Secondary | ICD-10-CM | POA: Insufficient documentation

## 2024-05-25 DIAGNOSIS — R011 Cardiac murmur, unspecified: Secondary | ICD-10-CM | POA: Diagnosis not present

## 2024-05-25 LAB — ECHOCARDIOGRAM COMPLETE
AR max vel: 2.1 cm2
AV Area VTI: 2.09 cm2
AV Area mean vel: 2.09 cm2
AV Mean grad: 3 mmHg
AV Peak grad: 6.1 mmHg
Ao pk vel: 1.23 m/s
Area-P 1/2: 3.08 cm2
Calc EF: 64.1 %
MV VTI: 1.87 cm2
S' Lateral: 2.55 cm
Single Plane A2C EF: 74.9 %
Single Plane A4C EF: 50.5 %

## 2024-05-28 ENCOUNTER — Telehealth: Payer: Self-pay | Admitting: *Deleted

## 2024-05-28 NOTE — Telephone Encounter (Signed)
 Left a message for the patient to call back to set up an appointment with Dr. Darron for mesenteric artery stenosis in the [facility] office. Appointment held on 9/5 at 9 am.

## 2024-06-01 NOTE — Telephone Encounter (Signed)
 Appointment added on 06/04/24

## 2024-06-04 ENCOUNTER — Ambulatory Visit: Attending: Cardiovascular Disease | Admitting: Cardiovascular Disease

## 2024-06-04 ENCOUNTER — Encounter: Payer: Self-pay | Admitting: Cardiovascular Disease

## 2024-06-04 VITALS — BP 128/74 | HR 79 | Ht 60.0 in | Wt 190.1 lb

## 2024-06-04 DIAGNOSIS — K551 Chronic vascular disorders of intestine: Secondary | ICD-10-CM | POA: Diagnosis not present

## 2024-06-04 DIAGNOSIS — I700 Atherosclerosis of aorta: Secondary | ICD-10-CM | POA: Diagnosis not present

## 2024-06-04 DIAGNOSIS — I10 Essential (primary) hypertension: Secondary | ICD-10-CM | POA: Diagnosis not present

## 2024-06-04 NOTE — Patient Instructions (Signed)
 Medication Instructions:  No changes *If you need a refill on your cardiac medications before your next appointment, please call your pharmacy*  Lab Work: None ordered If you have labs (blood work) drawn today and your tests are completely normal, you will receive your results only by: MyChart Message (if you have MyChart) OR A paper copy in the mail If you have any lab test that is abnormal or we need to change your treatment, we will call you to review the results.  Testing/Procedures: None ordered  Follow-Up: At [facility], you and your health needs are our priority.  As part of our continuing mission to provide you with exceptional heart care, our providers are all part of one team.  This team includes your primary Cardiologist (physician) and Advanced Practice Providers or APPs (Physician Assistants and Nurse Practitioners) who all work together to provide you with the care you need, when you need it.  Your next appointment:   4 month(s)  Provider:   You may see Dr. Darron or one of the following Advanced Practice Providers on your designated Care Team:   Lonni Meager, NP Lesley Maffucci, PA-C Bernardino Bring, PA-C Cadence [place], PA-C Tylene Lunch, NP Barnie Hila, NP    We recommend signing up for the patient portal called MyChart.  Sign up information is provided on this After Visit Summary.  MyChart is used to connect with patients for Virtual Visits (Telemedicine).  Patients are able to view lab/test results, encounter notes, upcoming appointments, etc.  Non-urgent messages can be sent to your provider as well.   To learn more about what you can do with MyChart, go to [email or web address].

## 2024-06-04 NOTE — Progress Notes (Signed)
 Cardiology Office Note   Date:  06/04/2024   ID:  April Singleton, DOB 1951/06/16, MRN [id number]  PCP:  Leavy Mole, PA-C  Cardiologist:  Dr. Budd Kindle  Chief Complaint  Patient presents with   Follow-up    Follow up ECHO,  No complaints today. Meds reviewed verbally with pt.      History of Present Illness: April Singleton is a 73 y.o. female who was referred for evaluation of stenosis of the superior mesenteric artery noted on CTA images in June of this year. She has known history of essential hypertension, diabetes mellitus, iron  deficiency anemia and obesity with a BMI of 37.  She was recently seen in our office for a cardiac murmur and exertional dyspnea.  She had an echocardiogram that showed normal LV systolic function and no significant valvular abnormalities.  The aortic valve was mildly calcified but no significant stenosis.  She is scheduled for cardiac CTA.  She had abdominal CT done in June of this year which showed abdominal aortic calcifications and significant stenosis at the origin of the superior mesenteric artery.  Thus, she was referred for vascular evaluation.  She describes chronic back pain with occasional abdominal discomfort which is usually in the lower part.  This usually does not correlate with food.  She does have bloating and she eats small meals but she has been taking Ozempic .  She lost about 60 pounds over the last year but that has been intentional.  She is limited by lower extremity arthritis but no significant claudication.   Past Medical History:  Diagnosis Date   Anemia    Arthritis    Asthma    Chronic kidney disease    Depression    Diabetes mellitus without complication (HCC)    GERD (gastroesophageal reflux disease)    History of kidney stones    Hypertension    Pneumonia    PONV (postoperative nausea and vomiting)     Past Surgical History:  Procedure Laterality Date   ABDOMINAL HYSTERECTOMY     BREAST BIOPSY     Pt  unsure of type of biopsy or even of which side   KNEE ARTHROPLASTY Left 04/29/2022   Procedure: COMPUTER ASSISTED TOTAL KNEE ARTHROPLASTY;  Surgeon: Mardee Lynwood SQUIBB, MD;  Location: [facility] ORS;  Service: Orthopedics;  Laterality: Left;   REDUCTION MAMMAPLASTY     TONSILLECTOMY       Current Outpatient Medications  Medication Sig Dispense Refill   acetaminophen  (TYLENOL ) 500 MG tablet Take 1,000 mg by mouth every 6 (six) hours as needed.     Blood Glucose Monitoring Suppl (BLOOD GLUCOSE MONITOR SYSTEM) w/Device KIT Use as directed 1 kit 0   clotrimazole -betamethasone  (LOTRISONE ) cream Apply 1 Application topically daily as needed. 45 g 3   diclofenac  (VOLTAREN ) 75 MG EC tablet Take 1 tablet (75 mg total) by mouth 2 (two) times daily with a meal. 60 tablet 1   doxycycline  (VIBRAMYCIN ) 50 MG capsule Take 1 capsule (50 mg total) by mouth daily. 30 capsule 2   estradiol  (ESTRACE ) 1 MG tablet Take 1 tablet (1 mg total) by mouth daily. 90 tablet 1   ezetimibe  (ZETIA ) 10 MG tablet Take 1 tablet (10 mg total) by mouth daily. 90 tablet 3   fluconazole  (DIFLUCAN ) 150 MG tablet Take 1 tablet (150 mg total) by mouth every 3 (three) days as needed (for vaginal itching/yeast infection sx). 4 tablet 0   gabapentin  (NEURONTIN ) 100 MG capsule Take 1 capsule (100  mg total) by mouth 2 (two) times daily. If no improvement after 5 days increase to 3 times daily 60 capsule 11   glucose blood test strip Use as directed. 100 each 0   hydrochlorothiazide  (HYDRODIURIL ) 12.5 MG tablet Take 1 tablet (12.5 mg total) by mouth daily. 90 tablet 1   ketoconazole  (NIZORAL ) 2 % cream Apply topically 2 (two) times daily To affected areas, use for up to 3-4 weeks 30 g 2   lamoTRIgine  (LAMICTAL ) 100 MG tablet Take 1 tablet (100 mg total) by mouth daily. 90 tablet 0   Lifitegrast  (XIIDRA ) 5 % SOLN Place 1 drop into affected eyes 2 (two) times daily. 180 each 4   losartan  (COZAAR ) 50 MG tablet Take 1 tablet (50 mg total) by mouth  daily. 90 tablet 1   metFORMIN  (GLUCOPHAGE -XR) 500 MG 24 hr tablet Take 1 tablet (500 mg total) by mouth 2 (two) times daily. 180 tablet 1   ondansetron  (ZOFRAN -ODT) 4 MG disintegrating tablet Take 1 tablet (4 mg total) by mouth every 8 (eight) hours as needed for nausea or vomiting. 20 tablet 0   OneTouch UltraSoft 2 Lancets MISC Use as directed. 100 each 0   pantoprazole  (PROTONIX ) 40 MG tablet Take 1 tablet (40 mg total) by mouth daily. 90 tablet 3   QUEtiapine  (SEROQUEL ) 25 MG tablet Take 1 tablet (25 mg total) by mouth at bedtime. 90 tablet 0   rosuvastatin  (CRESTOR ) 5 MG tablet Take 1 tablet (5 mg total) by mouth daily. 90 tablet 1   sertraline  (ZOLOFT ) 100 MG tablet Take 2 tablets (200 mg total) by mouth daily with breakfast. 180 tablet 0   SKYRIZI PEN 150 MG/ML pen Inject 150 mg into the skin. Unsure of dosing frequency     traZODone  (DESYREL ) 100 MG tablet Take 1 tablet (100 mg total) by mouth at bedtime as needed. 90 tablet 0   triamcinolone  cream (KENALOG ) 0.1 % Apply topically 2 (two) times daily. 30 g 1   verapamil  (CALAN -SR) 240 MG CR tablet Take 1 tablet (240 mg total) by mouth daily. 90 tablet 1   ipratropium (ATROVENT ) 0.06 % nasal spray Place 2 sprays into both nostrils 3 (three) times daily as needed for Rhinitis (Patient not taking: Reported on 06/04/2024) 15 mL 0   lamoTRIgine  (LAMICTAL ) 100 MG tablet Take 1 tablet (100 mg total) by mouth daily. (Patient not taking: Reported on 06/04/2024) 90 tablet 0   metoprolol  tartrate (LOPRESSOR ) 100 MG tablet TAKE 1 TABLET 2 HR PRIOR TO CARDIAC PROCEDURE (Patient not taking: Reported on 06/04/2024) 1 tablet 0   moxifloxacin (AVELOX) 400 MG tablet Take 400 mg by mouth. (Patient not taking: Reported on 06/04/2024)     QUEtiapine  (SEROQUEL ) 50 MG tablet Take 1 tablet (50 mg total) by mouth at bedtime. (Patient not taking: Reported on 06/04/2024) 90 tablet 0   Semaglutide , 1 MG/DOSE, (OZEMPIC , 1 MG/DOSE,) 4 MG/3ML SOPN Inject 1 mg into the skin once a  week. (Patient not taking: Reported on 06/04/2024) 9 mL 1   sertraline  (ZOLOFT ) 100 MG tablet Take 2 tablets (200 mg total) by mouth daily with breakfast. (Patient not taking: Reported on 06/04/2024) 180 tablet 0   silver  sulfADIAZINE  (SILVADENE ) 1 % cream Apply 1 Application topically daily. (Patient not taking: Reported on 06/04/2024) 90 g 1   tiZANidine  (ZANAFLEX ) 2 MG tablet Take 1 tablet (2 mg total) by mouth every 8 (eight) hours as needed for Muscle spasms (Patient not taking: Reported on 06/04/2024) 30 tablet 0  traZODone  (DESYREL ) 100 MG tablet Take 1 tablet (100 mg total) by mouth at bedtime as needed. (Patient not taking: Reported on 06/04/2024) 90 tablet 0   No current facility-administered medications for this visit.    Allergies:   Codeine, Prednisone , and Sulfa antibiotics    Social History:  The patient  reports that she has never smoked. She has never used smokeless tobacco. She reports that she does not currently use alcohol. She reports that she does not use drugs.   Family History:  The patient's family history includes Breast cancer in her maternal aunt and maternal aunt; Congestive Heart Failure in her mother; Fainting in her maternal grandfather; Heart disease in her maternal grandfather and mother; Heart failure in her maternal grandmother; Hypertension in her brother, father, maternal uncle, mother, paternal aunt, and sister; Liver cancer in her father; Lung cancer in her father and mother.    ROS:  Please see the history of present illness.   Otherwise, review of systems are positive for none.   All other systems are reviewed and negative.    PHYSICAL EXAM: VS:  BP 128/74 (BP Location: Left Arm, Patient Position: Sitting, Cuff Size: Large)   Pulse 79   Ht 5' (1.524 m)   Wt 190 lb 2 oz (86.2 kg)   SpO2 99%   BMI 37.13 kg/m  , BMI Body mass index is 37.13 kg/m. GEN: Well nourished, well developed, in no acute distress  HEENT: normal  Neck: no JVD, carotid bruits, or  masses Cardiac: RRR; no  rubs, or gallops,no edema .  2 out of 6 systolic murmur in the aortic area Respiratory:  clear to auscultation bilaterally, normal work of breathing GI: soft, nontender, nondistended, + BS MS: no deformity or atrophy  Skin: warm and dry, no rash Neuro:  Strength and sensation are intact Psych: euthymic mood, full affect Dorsalis pedis is normal bilaterally  EKG:  EKG is not ordered today.    Recent Labs: 03/22/2024: ALT 19; Hemoglobin 10.3; Platelets 242 05/17/2024: BUN 19; Creatinine, Ser 1.04; Potassium 4.5; Sodium 137    Lipid Panel    Component Value Date/Time   CHOL 193 03/08/2024 1428   TRIG 211 (H) 03/08/2024 1428   HDL 88 03/08/2024 1428   CHOLHDL 2.2 03/08/2024 1428   VLDL 77 (H) 03/16/2018 1228   LDLCALC 74 03/08/2024 1428      Wt Readings from Last 3 Encounters:  06/04/24 190 lb 2 oz (86.2 kg)  05/17/24 187 lb 12.8 oz (85.2 kg)  04/21/24 185 lb (83.9 kg)            No data to display            ASSESSMENT AND PLAN:  1.  Stenosis of the superior mesenteric artery: She does not have classic symptoms of chronic mesenteric ischemia.  I did explain to her that stenosis in one of the mesenteric arteries is usually not sufficient to give symptoms unless there is involvement of the celiac trunk and inferior mesenteric artery.  This is likely not the case here.  Even though she is losing weight, that has been intentional with the use of Ozempic . I favor monitoring this for now and will try to correlate with any postprandial abdominal symptoms which she does not seem to have at the present time.  2.  Aortic calcifications: She has no convincing symptoms of claudication and her distal pulses are palpable.  Continue treatment of risk factors.  3.  Exertional dyspnea: She is  scheduled for cardiac CTA.  4.  Essential hypertension: Blood pressure is controlled.     Disposition:   FU with me in 4 months  Signed,  Deatrice Cage, MD   06/04/2024 9:11 AM    [facility]

## 2024-06-09 ENCOUNTER — Other Ambulatory Visit (HOSPITAL_COMMUNITY): Payer: Self-pay

## 2024-06-11 ENCOUNTER — Other Ambulatory Visit: Payer: Self-pay

## 2024-06-11 ENCOUNTER — Telehealth (HOSPITAL_COMMUNITY): Payer: Self-pay | Admitting: Emergency Medicine

## 2024-06-11 NOTE — Telephone Encounter (Signed)
 Reaching out to patient to offer assistance regarding upcoming cardiac imaging study; pt verbalizes understanding of appt date/time, parking situation and where to check in, pre-test NPO status and medications ordered, and verified current allergies; name and call back number provided for further questions should they arise Rockwell Alexandria RN Navigator Cardiac Imaging [facility] [phone] office [phone] cell

## 2024-06-14 ENCOUNTER — Other Ambulatory Visit: Payer: Self-pay

## 2024-06-14 ENCOUNTER — Ambulatory Visit
Admission: RE | Admit: 2024-06-14 | Discharge: 2024-06-14 | Disposition: A | Discharge status: Home / Self Care | Source: Ambulatory Visit

## 2024-06-14 ENCOUNTER — Ambulatory Visit
Admission: RE | Admit: 2024-06-14 | Discharge: 2024-06-14 | Disposition: A | Discharge status: Home / Self Care | Source: Ambulatory Visit | Attending: Cardiology | Admitting: Cardiology

## 2024-06-14 DIAGNOSIS — I2510 Atherosclerotic heart disease of native coronary artery without angina pectoris: Secondary | ICD-10-CM | POA: Insufficient documentation

## 2024-06-14 DIAGNOSIS — I700 Atherosclerosis of aorta: Secondary | ICD-10-CM | POA: Insufficient documentation

## 2024-06-14 DIAGNOSIS — R0609 Other forms of dyspnea: Secondary | ICD-10-CM | POA: Insufficient documentation

## 2024-06-14 DIAGNOSIS — R931 Abnormal findings on diagnostic imaging of heart and coronary circulation: Secondary | ICD-10-CM | POA: Insufficient documentation

## 2024-06-14 MED ORDER — NITROGLYCERIN 0.4 MG SL SUBL
0.8000 mg | SUBLINGUAL_TABLET | Freq: Once | SUBLINGUAL | Status: AC
  Administered 2024-06-14: 0.8 mg via SUBLINGUAL
  Filled 2024-06-14: qty 25

## 2024-06-14 MED ORDER — IOHEXOL 350 MG/ML SOLN
100.0000 mL | Freq: Once | INTRAVENOUS | Status: AC | PRN
  Administered 2024-06-14: 100 mL via INTRAVENOUS

## 2024-06-14 NOTE — Progress Notes (Signed)
 Patient tolerated CT well. Vital signs stable encourage to drink water throughout day.Reasons explained and verbalized understanding. Ambulated steady gait.

## 2024-06-15 ENCOUNTER — Ambulatory Visit: Payer: Self-pay | Admitting: Cardiology

## 2024-06-21 ENCOUNTER — Ambulatory Visit

## 2024-06-22 ENCOUNTER — Ambulatory Visit
Admission: RE | Admit: 2024-06-22 | Discharge: 2024-06-22 | Disposition: A | Discharge status: Home / Self Care | Source: Ambulatory Visit | Attending: Urology | Admitting: Urology

## 2024-06-22 ENCOUNTER — Other Ambulatory Visit: Payer: Self-pay

## 2024-06-22 ENCOUNTER — Ambulatory Visit: Admitting: Urology

## 2024-06-22 ENCOUNTER — Ambulatory Visit
Admission: RE | Admit: 2024-06-22 | Discharge: 2024-06-22 | Disposition: A | Discharge status: Home / Self Care | Attending: Urology | Admitting: Urology

## 2024-06-22 VITALS — BP 116/67 | HR 85 | Ht 60.0 in | Wt 185.0 lb

## 2024-06-22 DIAGNOSIS — N390 Urinary tract infection, site not specified: Secondary | ICD-10-CM

## 2024-06-22 LAB — URINALYSIS, COMPLETE
Bilirubin, UA: NEGATIVE
Glucose, UA: NEGATIVE
Nitrite, UA: POSITIVE — AB
RBC, UA: NEGATIVE
Specific Gravity, UA: 1.03 (ref 1.005–1.030)
Urobilinogen, Ur: 1 mg/dL (ref 0.2–1.0)
pH, UA: 6 (ref 5.0–7.5)

## 2024-06-22 LAB — MICROSCOPIC EXAMINATION
Epithelial Cells (non renal): >10 /HPF — AB (ref 0–10)
WBC, UA: >30 /HPF — AB (ref 0–5)

## 2024-06-22 LAB — BLADDER SCAN AMB NON-IMAGING: Scan Result: 0

## 2024-06-22 MED ORDER — AMOXICILLIN-POT CLAVULANATE 875-125 MG PO TABS
1.0000 | ORAL_TABLET | Freq: Two times a day (BID) | ORAL | 0 refills | Status: DC
  Filled 2024-06-22: qty 14, 7d supply, fill #0

## 2024-06-22 NOTE — Progress Notes (Signed)
 06/22/2024 1:47 PM   CUMI SANAGUSTIN Oct 21, 1950 [id number]  Referring provider: Leavy Mole, PA-C [id number] [place],  [place] [address]  Chief Complaint  Patient presents with   Urinary Frequency    HPI: April Singleton is a 73 y.o. female presents for a 2 month follow-up.  Refer to my previous note 04/21/2024 She was doing much better after starting low-dose antibiotic prophylaxis.  Last week she did note increased odor to her urine and over the weekend she had onset of urinary frequency, urgency, dysuria and left low back pain and pelvic pressure. She has had subjective fever and some chills   PMH: Past Medical History:  Diagnosis Date   Anemia    Arthritis    Asthma    Chronic kidney disease    Depression    Diabetes mellitus without complication (HCC)    GERD (gastroesophageal reflux disease)    History of kidney stones    Hypertension    Pneumonia    PONV (postoperative nausea and vomiting)     Surgical History: Past Surgical History:  Procedure Laterality Date   ABDOMINAL HYSTERECTOMY     BREAST BIOPSY     Pt unsure of type of biopsy or even of which side   KNEE ARTHROPLASTY Left 04/29/2022   Procedure: COMPUTER ASSISTED TOTAL KNEE ARTHROPLASTY;  Surgeon: Mardee Lynwood SQUIBB, MD;  Location: [facility] ORS;  Service: Orthopedics;  Laterality: Left;   REDUCTION MAMMAPLASTY     TONSILLECTOMY      Home Medications:  Allergies as of 06/22/2024       Reactions   Codeine Other (See Comments)   Nausea and vomiting   Prednisone  Other (See Comments)   Makes her violent , patient reports only in high doses   Sulfa Antibiotics Hives        Medication List        Accurate as of June 22, 2024  1:47 PM. If you have any questions, ask your nurse or doctor.          STOP taking these medications    ipratropium 0.06 % nasal spray Commonly known as: ATROVENT    metoprolol  tartrate 100 MG tablet Commonly known as: LOPRESSOR    moxifloxacin  400 MG tablet Commonly known as: AVELOX   tiZANidine  2 MG tablet Commonly known as: ZANAFLEX        TAKE these medications    acetaminophen  500 MG tablet Commonly known as: TYLENOL  Take 1,000 mg by mouth every 6 (six) hours as needed.   clotrimazole -betamethasone  cream Commonly known as: LOTRISONE  Apply 1 Application topically daily as needed.   diclofenac  75 MG EC tablet Commonly known as: VOLTAREN  Take 1 tablet (75 mg total) by mouth 2 (two) times daily with a meal.   doxycycline  50 MG capsule Commonly known as: VIBRAMYCIN  Take 1 capsule (50 mg total) by mouth daily.   estradiol  1 MG tablet Commonly known as: Estrace  Take 1 tablet (1 mg total) by mouth daily.   ezetimibe  10 MG tablet Commonly known as: ZETIA  Take 1 tablet (10 mg total) by mouth daily.   fluconazole  150 MG tablet Commonly known as: DIFLUCAN  Take 1 tablet (150 mg total) by mouth every 3 (three) days as needed (for vaginal itching/yeast infection sx).   gabapentin  100 MG capsule Commonly known as: NEURONTIN  Take 1 capsule (100 mg total) by mouth 2 (two) times daily. If no improvement after 5 days increase to 3 times daily   hydrochlorothiazide  12.5 MG tablet Commonly known  as: HYDRODIURIL  Take 1 tablet (12.5 mg total) by mouth daily.   ketoconazole  2 % cream Commonly known as: NIZORAL  Apply topically 2 (two) times daily To affected areas, use for up to 3-4 weeks   lamoTRIgine  100 MG tablet Commonly known as: LaMICtal  Take 1 tablet (100 mg total) by mouth daily. What changed: Another medication with the same name was removed. Continue taking this medication, and follow the directions you see here.   losartan  50 MG tablet Commonly known as: COZAAR  Take 1 tablet (50 mg total) by mouth daily.   metFORMIN  500 MG 24 hr tablet Commonly known as: GLUCOPHAGE -XR Take 1 tablet (500 mg total) by mouth 2 (two) times daily.   ondansetron  4 MG disintegrating tablet Commonly known as: ZOFRAN -ODT Take 1  tablet (4 mg total) by mouth every 8 (eight) hours as needed for nausea or vomiting.   OneTouch UltraSoft 2 Lancets Misc Use as directed.   OneTouch Verio Flex System w/Device Kit Use as directed   OneTouch Verio test strip Generic drug: glucose blood Use as directed.   Ozempic  (1 MG/DOSE) 4 MG/3ML Sopn Generic drug: Semaglutide  (1 MG/DOSE) Inject 1 mg into the skin once a week.   pantoprazole  40 MG tablet Commonly known as: Protonix  Take 1 tablet (40 mg total) by mouth daily.   QUEtiapine  25 MG tablet Commonly known as: SEROquel  Take 1 tablet (25 mg total) by mouth at bedtime. What changed: Another medication with the same name was removed. Continue taking this medication, and follow the directions you see here.   rosuvastatin  5 MG tablet Commonly known as: Crestor  Take 1 tablet (5 mg total) by mouth daily.   sertraline  100 MG tablet Commonly known as: Zoloft  Take 2 tablets (200 mg total) by mouth daily with breakfast. What changed: Another medication with the same name was removed. Continue taking this medication, and follow the directions you see here.   silver  sulfADIAZINE  1 % cream Commonly known as: SILVADENE  Apply 1 Application topically daily.   Skyrizi Pen 150 MG/ML pen Generic drug: risankizumab-rzaa Inject 150 mg into the skin. Unsure of dosing frequency   traZODone  100 MG tablet Commonly known as: DESYREL  Take 1 tablet (100 mg total) by mouth at bedtime as needed. What changed: Another medication with the same name was removed. Continue taking this medication, and follow the directions you see here.   triamcinolone  cream 0.1 % Commonly known as: KENALOG  Apply topically 2 (two) times daily.   verapamil  240 MG CR tablet Commonly known as: CALAN -SR Take 1 tablet (240 mg total) by mouth daily.   Xiidra  5 % Soln Generic drug: Lifitegrast  Place 1 drop into affected eyes 2 (two) times daily.        Allergies:  Allergies  Allergen Reactions    Codeine Other (See Comments)    Nausea and vomiting   Prednisone  Other (See Comments)    Makes her violent , patient reports only in high doses   Sulfa Antibiotics Hives    Family History: Family History  Problem Relation Age of Onset   Heart disease Mother    Lung cancer Mother    Hypertension Mother    Congestive Heart Failure Mother    Hypertension Father    Liver cancer Father    Lung cancer Father    Hypertension Sister    Hypertension Brother    Breast cancer Maternal Aunt    Breast cancer Maternal Aunt    Hypertension Maternal Uncle    Hypertension Paternal Aunt  Heart failure Maternal Grandmother    Fainting Maternal Grandfather    Heart disease Maternal Grandfather     Social History:  reports that she has never smoked. She has never used smokeless tobacco. She reports that she does not currently use alcohol. She reports that she does not use drugs.   Physical Exam: BP 116/67   Pulse 85   Ht 5' (1.524 m)   Wt 185 lb (83.9 kg)   BMI 36.13 kg/m   Constitutional:  Alert, No acute distress. HEENT: [place] AT Respiratory: Normal respiratory effort, no increased work of breathing. Psychiatric: Normal mood and affect.  Laboratory Data:  Urinalysis: Dipstick nitrite +/2+ leukocytes Microscopy >30 RBC/>10 epis   Assessment & Plan:    1. Recurrent UTI  PVR 0 mL She will stop her low-dose antibiotic prophylaxis Urine culture ordered On review of her last culture the Klebsiella was sensitive to Augmentin  and Rx sent to pharmacy She is hurting left low back.  Prior CT showed a nonobstructing right renal calculus.  KUB was ordered   Glendia JAYSON Barba, MD  [facility] [address], [address] [place], [place] [address] [phone]

## 2024-06-26 LAB — CULTURE, URINE COMPREHENSIVE

## 2024-06-27 ENCOUNTER — Encounter: Payer: Self-pay | Admitting: Urology

## 2024-06-27 ENCOUNTER — Ambulatory Visit: Payer: Self-pay | Admitting: Urology

## 2024-06-28 NOTE — Telephone Encounter (Signed)
 Pt called us  back. I let her know that the antibiotics she is on are sensitive to the bacteria that her culture showed. Pt was schedule with a follow up with Dr. Stoioff to discuss medication management in the future.

## 2024-06-30 ENCOUNTER — Encounter: Payer: Self-pay | Admitting: Nurse Practitioner

## 2024-06-30 ENCOUNTER — Other Ambulatory Visit: Payer: Self-pay

## 2024-06-30 ENCOUNTER — Ambulatory Visit: Attending: Nurse Practitioner | Admitting: Nurse Practitioner

## 2024-06-30 VITALS — BP 117/50 | HR 80 | Ht 60.0 in | Wt 186.4 lb

## 2024-06-30 DIAGNOSIS — K551 Chronic vascular disorders of intestine: Secondary | ICD-10-CM

## 2024-06-30 DIAGNOSIS — E1165 Type 2 diabetes mellitus with hyperglycemia: Secondary | ICD-10-CM | POA: Diagnosis not present

## 2024-06-30 DIAGNOSIS — I2510 Atherosclerotic heart disease of native coronary artery without angina pectoris: Secondary | ICD-10-CM

## 2024-06-30 DIAGNOSIS — E785 Hyperlipidemia, unspecified: Secondary | ICD-10-CM

## 2024-06-30 DIAGNOSIS — R0609 Other forms of dyspnea: Secondary | ICD-10-CM

## 2024-06-30 DIAGNOSIS — Z79899 Other long term (current) drug therapy: Secondary | ICD-10-CM | POA: Diagnosis not present

## 2024-06-30 DIAGNOSIS — D509 Iron deficiency anemia, unspecified: Secondary | ICD-10-CM

## 2024-06-30 DIAGNOSIS — I10 Essential (primary) hypertension: Secondary | ICD-10-CM

## 2024-06-30 MED ORDER — ROSUVASTATIN CALCIUM 5 MG PO TABS
5.0000 mg | ORAL_TABLET | Freq: Every day | ORAL | 3 refills | Status: AC
  Filled 2024-06-30: qty 90, 90d supply, fill #0
  Filled 2024-07-30 – 2024-10-05 (×2): qty 90, 90d supply, fill #1

## 2024-06-30 NOTE — Progress Notes (Signed)
 Office Visit    Patient Name: April Singleton Date of Encounter: 06/30/2024  Primary Care Provider:  Leavy Mole, PA-C Primary Cardiologist:  April Cave, MD  Cardiology APP:  April Lonni Ingle, NP   Chief Complaint    73 y.o. female with a history of hypertension, diabetes, iron  deficiency anemia, depression, GERD, nephrolithiasis, nonobstructive CAD, and diastolic dysfunction, who presents for follow-up after recent cardiac testing (CTA and echo).  Past Medical History   Subjective   Past Medical History:  Diagnosis Date   Anemia    Arthritis    Asthma    CAD (coronary artery disease)    a. 05/2024 Cor CTA: Ca2+ = 817 (95th%'ile). LM <25, LAD 25-49p, LCX non-dominant, OM1 <25, RCA dominant 25-49p, 50-79m, 50-69d, RPDA 50-69.  CT FFR of RCA 0.94/0.91/0.80 ->non-obs dzs.   Depression    Diabetes mellitus without complication (HCC)    Diastolic dysfunction    a. 04/2024 Echo: EF 60-65%, GrI DD, nl RV size/fxn, AoV sclerosis w/o stenosis.   GERD (gastroesophageal reflux disease)    Hypertension    Nephrolithiasis    a. 02/2024 CT Abd: 4mm R lower pole renal stone.   Pneumonia    PONV (postoperative nausea and vomiting)    Superior mesenteric artery stenosis    a. 02/2024 CT Abd: sev SMA stenosis.   Past Surgical History:  Procedure Laterality Date   ABDOMINAL HYSTERECTOMY     BREAST BIOPSY     Pt unsure of type of biopsy or even of which side   KNEE ARTHROPLASTY Left 04/29/2022   Procedure: COMPUTER ASSISTED TOTAL KNEE ARTHROPLASTY;  Surgeon: April Lynwood SQUIBB, MD;  Location: [facility] ORS;  Service: Orthopedics;  Laterality: Left;   REDUCTION MAMMAPLASTY     TONSILLECTOMY      Allergies  Allergies  Allergen Reactions   Codeine Other (See Comments)    Nausea and vomiting   Prednisone  Other (See Comments)    Makes her violent , patient reports only in high doses   Sulfa Antibiotics Hives       History of Present Illness      73 y.o. y/o female with  a history of hypertension, diabetes, iron  deficiency anemia, depression, GERD, nephrolithiasis, nonobstructive CAD, and diastolic dysfunction.  In June 2025, in the setting of abdominal discomfort, CT of the abdomen was performed incidentally showed severe superior mesenteric artery stenosis.  She subsequently followed up with primary care and in the setting of systolic murmur and SMA stenosis, she was referred to cardiology.  At office visit in August 2025, she reported dyspnea on exertion as well.  Echocardiogram was performed and showed an EF of 60-65% with grade 1 diastolic dysfunction and aortic valve sclerosis without stenosis.  She subsequently underwent coronary CT angiogram which showed a calcium  score of 817 (95th percentile), and mild to moderate nonobstructive disease, particular in the right coronary artery.  CT FFR throughout the RCA was normal.    Regarding SMA stenosis, she was seen by Dr. Darron in September with recommendation to follow clinically given absence of symptoms indicative of mesenteric ischemia.   Today, April Singleton reports ongoing dyspnea on exertion, which is longstanding.  She notes that she remains active throughout the day doing assorted things in and around her home, but does not routinely exercise, noting that ever since a knee surgery in 2023, she has had multiple has that have sidetracked her activity levels.  She is currently being treated for UTI and recently noted a  cough.  After doing some research on the Internet, she wondered if she was developing pneumonia.  She has been afebrile and cough has been nonproductive.  She notes that she sleeps for 10 to 11 hours at night and has fatigue throughout the day.  She says she sleeps on her stomach and previously had a negative sleep study.  She is not interested in a referral for repeat.  She denies palpitations, PND, orthopnea, dizziness, syncope, edema, or early satiety.  Test results discussed in detail today. Objective    Home Medications    Current Outpatient Medications  Medication Sig Dispense Refill   acetaminophen  (TYLENOL ) 500 MG tablet Take 1,000 mg by mouth every 6 (six) hours as needed.     Blood Glucose Monitoring Suppl (BLOOD GLUCOSE MONITOR SYSTEM) w/Device KIT Use as directed 1 kit 0   clotrimazole -betamethasone  (LOTRISONE ) cream Apply 1 Application topically daily as needed. 45 g 3   diclofenac  (VOLTAREN ) 75 MG EC tablet Take 1 tablet (75 mg total) by mouth 2 (two) times daily with a meal. 60 tablet 1   estradiol  (ESTRACE ) 1 MG tablet Take 1 tablet (1 mg total) by mouth daily. 90 tablet 1   ezetimibe  (ZETIA ) 10 MG tablet Take 1 tablet (10 mg total) by mouth daily. 90 tablet 3   fluconazole  (DIFLUCAN ) 150 MG tablet Take 1 tablet (150 mg total) by mouth every 3 (three) days as needed (for vaginal itching/yeast infection sx). 4 tablet 0   gabapentin  (NEURONTIN ) 100 MG capsule Take 1 capsule (100 mg total) by mouth 2 (two) times daily. If no improvement after 5 days increase to 3 times daily 60 capsule 11   glucose blood test strip Use as directed. 100 each 0   hydrochlorothiazide  (HYDRODIURIL ) 12.5 MG tablet Take 1 tablet (12.5 mg total) by mouth daily. 90 tablet 1   ketoconazole  (NIZORAL ) 2 % cream Apply topically 2 (two) times daily To affected areas, use for up to 3-4 weeks 30 g 2   lamoTRIgine  (LAMICTAL ) 100 MG tablet Take 1 tablet (100 mg total) by mouth daily. 90 tablet 0   losartan  (COZAAR ) 50 MG tablet Take 1 tablet (50 mg total) by mouth daily. 90 tablet 1   metFORMIN  (GLUCOPHAGE -XR) 500 MG 24 hr tablet Take 1 tablet (500 mg total) by mouth 2 (two) times daily. 180 tablet 1   ondansetron  (ZOFRAN -ODT) 4 MG disintegrating tablet Take 1 tablet (4 mg total) by mouth every 8 (eight) hours as needed for nausea or vomiting. 20 tablet 0   OneTouch UltraSoft 2 Lancets MISC Use as directed. 100 each 0   pantoprazole  (PROTONIX ) 40 MG tablet Take 1 tablet (40 mg total) by mouth daily. 90 tablet 3    QUEtiapine  (SEROQUEL ) 25 MG tablet Take 1 tablet (25 mg total) by mouth at bedtime. 90 tablet 0   Semaglutide , 1 MG/DOSE, (OZEMPIC , 1 MG/DOSE,) 4 MG/3ML SOPN Inject 1 mg into the skin once a week. 9 mL 1   sertraline  (ZOLOFT ) 100 MG tablet Take 2 tablets (200 mg total) by mouth daily with breakfast. 180 tablet 0   silver  sulfADIAZINE  (SILVADENE ) 1 % cream Apply 1 Application topically daily. 90 g 1   SKYRIZI PEN 150 MG/ML pen Inject 150 mg into the skin. Unsure of dosing frequency     traZODone  (DESYREL ) 100 MG tablet Take 1 tablet (100 mg total) by mouth at bedtime as needed. 90 tablet 0   triamcinolone  cream (KENALOG ) 0.1 % Apply topically 2 (two) times daily. 30  g 1   verapamil  (CALAN -SR) 240 MG CR tablet Take 1 tablet (240 mg total) by mouth daily. 90 tablet 1   rosuvastatin  (CRESTOR ) 5 MG tablet Take 1 tablet (5 mg total) by mouth daily. 90 tablet 3   No current facility-administered medications for this visit.     Physical Exam    VS:  BP (!) 117/50 (BP Location: Left Arm, Patient Position: Sitting, Cuff Size: Normal)   Pulse 80   Ht 5' (1.524 m)   Wt 186 lb 6.4 oz (84.6 kg)   SpO2 95%   BMI 36.40 kg/m  , BMI Body mass index is 36.4 kg/m.          GEN: Well nourished, well developed, in no acute distress. HEENT: normal. Neck: Supple, no JVD, carotid bruits, or masses. Cardiac: RRR, no murmurs, rubs, or gallops. No clubbing, cyanosis, edema.  Radials 2+/PT 2+ and equal bilaterally.  Respiratory:  Respirations regular and unlabored, clear to auscultation bilaterally. GI: Soft, nontender, nondistended, BS + x 4. MS: no deformity or atrophy. Skin: warm and dry, no rash. Neuro:  Strength and sensation are intact. Psych: Normal affect.  Accessory Clinical Findings    Lab Results  Component Value Date   WBC 6.4 03/22/2024   HGB 10.3 (L) 03/22/2024   HCT 34.5 (L) 03/22/2024   MCV 73.9 (L) 03/22/2024   PLT 242 03/22/2024   Lab Results  Component Value Date   CREATININE  1.04 (H) 05/17/2024   BUN 19 05/17/2024   NA 137 05/17/2024   K 4.5 05/17/2024   CL 101 05/17/2024   CO2 22 05/17/2024   Lab Results  Component Value Date   ALT 19 03/22/2024   AST 25 03/22/2024   ALKPHOS 55 03/22/2024   BILITOT 0.6 03/22/2024   Lab Results  Component Value Date   CHOL 193 03/08/2024   HDL 88 03/08/2024   LDLCALC 74 03/08/2024   TRIG 211 (H) 03/08/2024   CHOLHDL 2.2 03/08/2024    Lab Results  Component Value Date   HGBA1C 6.1 (H) 03/08/2024   Lab Results  Component Value Date   TSH 3.05 09/11/2022       Assessment & Plan    1.  Nonobstructive CAD: Recent coronary CT angiogram performed in the setting of fatigue and dyspnea on exertion.  This revealed moderate nonobstructive CAD with normal CT FFR in the right coronary artery.  Discussed results in detail today.  She is now on aspirin and Zetia  therapy was also added to her background therapy of rosuvastatin  in the setting of LDL of 74 over the summer.  She did not realize that she was to continue rosuvastatin  once a Zetia  was started but will resume this now.  Plan for follow-up lipids and LFTs in 6 weeks.  Strongly encouraged up regular exercise as tolerated, peripherally up to 30 minutes 5 times a week, though activity is historically limited by knee and back pain.  2.  Primary hypertension: Blood pressure is controlled at 117/50 on HCTZ, losartan , and verapamil .  3.  Hyperlipidemia: LDL of 74 in June of this year on rosuvastatin  therapy.  Recent placed on Zetia  10 mg daily in the setting of finding of nonobstructive CAD and multiple risk factors with LDL goal of less than 70.  She did not realize that she was to continue rosuvastatin  when she started Zetia  but will resume.  Plan for follow-up lipids and LFTs in 6 weeks.  4.  Type 2 diabetes mellitus: A1c of  6.1 in June.  She is followed closely by primary care and remains on metformin , semaglutide , ARB, and statin/Zetia  therapy.  5.  Peripheral arterial  disease/SMA stenosis: CT over the summer with suggestion of SMA stenosis.  Seen by Dr. Darron with recommendation for conservative therapy at this time in the setting of lack of symptoms consistent with mesenteric ischemia.  She remains on aspirin, statin, and Zetia  therapy.  6.  Dyspnea on exertion: Longstanding dyspnea on exertion and deconditioning.  Recent echocardiogram showed normal LV function with grade 1 diastolic dysfunction and without significant valvular disease.  Discussed results of echocardiogram and coronary CT angiogram today.  Encouraged regular exercise as tolerated with a goal of 30 minutes daily if possible.  7.  Iron  deficiency anemia: Followed closely by hematology.  She has received iron  infusions and overall tolerates.  H&H 10.3/34.5 in June.  8.  Disposition: Follow-up lipids and LFTs in approximately 6 weeks.  Follow-up in 3 months or sooner if necessary.  Lonni Meager, NP 06/30/2024, 8:32 PM

## 2024-06-30 NOTE — Patient Instructions (Signed)
 Medication Instructions:  Your physician recommends the following medication changes.  START TAKING: Crestor  (rosuvastatin ) 5 mg daily  *If you need a refill on your cardiac medications before your next appointment, please call your pharmacy*  Lab Work: Your provider would like for you to return in 6 weeks to have the following labs drawn: fasting lipid panel and liver function test.   Please go to [facility] [address] ([facility] [address], [place] [address] You do not need an appointment.  They are open from 8 am- 4:30 pm.  Lunch from 1:00 pm- 2:00 pm You DO need to be fasting.   You may also go to one of the following [facility]:  [address] [place], [place] [address] Phone: [phone] Lab hours: Mon-Fri 8 am- 5 pm    Lunch 12 pm- 1 pm  [address] [place],  [place]  [address]  [place]  Phone: [phone] Lab hours: 7 am- 4 pm Lunch 12 pm-1 pm   [address] [place],  [place]  [address]  [place]  Phone: [phone] Lab hours: Mon-Fri 8 am- 5 pm    Lunch 12 pm- 1 pm  If you have labs (blood work) drawn today and your tests are completely normal, you will receive your results only by: MyChart Message (if you have MyChart) OR A paper copy in the mail If you have any lab test that is abnormal or we need to change your treatment, we will call you to review the results.  Follow-Up: At [facility], you and your health needs are our priority.  As part of our continuing mission to provide you with exceptional heart care, our providers are all part of one team.  This team includes your primary Cardiologist (physician) and Advanced Practice Providers or APPs (Physician Assistants and Nurse Practitioners) who all work together to provide you with the care you need, when you need it.  Your next appointment:   6 month(s)  Provider:   You may see Redell Cave, MD or Lonni Meager, NP

## 2024-07-07 ENCOUNTER — Inpatient Hospital Stay
Admission: EM | Admit: 2024-07-07 | Discharge: 2024-07-09 | DRG: 690 | Disposition: A | Discharge status: Home / Self Care | Source: Ambulatory Visit | Attending: Internal Medicine | Admitting: Internal Medicine

## 2024-07-07 ENCOUNTER — Telehealth: Payer: Self-pay

## 2024-07-07 ENCOUNTER — Other Ambulatory Visit: Payer: Self-pay

## 2024-07-07 DIAGNOSIS — Z888 Allergy status to other drugs, medicaments and biological substances status: Secondary | ICD-10-CM

## 2024-07-07 DIAGNOSIS — Z87440 Personal history of urinary (tract) infections: Secondary | ICD-10-CM

## 2024-07-07 DIAGNOSIS — Z885 Allergy status to narcotic agent status: Secondary | ICD-10-CM

## 2024-07-07 DIAGNOSIS — Z7984 Long term (current) use of oral hypoglycemic drugs: Secondary | ICD-10-CM

## 2024-07-07 DIAGNOSIS — K551 Chronic vascular disorders of intestine: Secondary | ICD-10-CM | POA: Diagnosis present

## 2024-07-07 DIAGNOSIS — Z79899 Other long term (current) drug therapy: Secondary | ICD-10-CM

## 2024-07-07 DIAGNOSIS — I110 Hypertensive heart disease with heart failure: Secondary | ICD-10-CM | POA: Diagnosis present

## 2024-07-07 DIAGNOSIS — Z803 Family history of malignant neoplasm of breast: Secondary | ICD-10-CM

## 2024-07-07 DIAGNOSIS — Z79620 Long term (current) use of immunosuppressive biologic: Secondary | ICD-10-CM

## 2024-07-07 DIAGNOSIS — I2510 Atherosclerotic heart disease of native coronary artery without angina pectoris: Secondary | ICD-10-CM | POA: Diagnosis present

## 2024-07-07 DIAGNOSIS — Z96652 Presence of left artificial knee joint: Secondary | ICD-10-CM | POA: Diagnosis present

## 2024-07-07 DIAGNOSIS — A498 Other bacterial infections of unspecified site: Secondary | ICD-10-CM

## 2024-07-07 DIAGNOSIS — E1151 Type 2 diabetes mellitus with diabetic peripheral angiopathy without gangrene: Secondary | ICD-10-CM | POA: Diagnosis present

## 2024-07-07 DIAGNOSIS — I5032 Chronic diastolic (congestive) heart failure: Secondary | ICD-10-CM | POA: Diagnosis present

## 2024-07-07 DIAGNOSIS — E66813 Obesity, class 3: Secondary | ICD-10-CM | POA: Diagnosis present

## 2024-07-07 DIAGNOSIS — Z6836 Body mass index (BMI) 36.0-36.9, adult: Secondary | ICD-10-CM

## 2024-07-07 DIAGNOSIS — J45909 Unspecified asthma, uncomplicated: Secondary | ICD-10-CM | POA: Diagnosis present

## 2024-07-07 DIAGNOSIS — E119 Type 2 diabetes mellitus without complications: Secondary | ICD-10-CM

## 2024-07-07 DIAGNOSIS — Z1624 Resistance to multiple antibiotics: Secondary | ICD-10-CM | POA: Diagnosis present

## 2024-07-07 DIAGNOSIS — Z882 Allergy status to sulfonamides status: Secondary | ICD-10-CM

## 2024-07-07 DIAGNOSIS — N390 Urinary tract infection, site not specified: Secondary | ICD-10-CM | POA: Diagnosis not present

## 2024-07-07 DIAGNOSIS — Z801 Family history of malignant neoplasm of trachea, bronchus and lung: Secondary | ICD-10-CM

## 2024-07-07 DIAGNOSIS — K219 Gastro-esophageal reflux disease without esophagitis: Secondary | ICD-10-CM | POA: Diagnosis present

## 2024-07-07 DIAGNOSIS — B961 Klebsiella pneumoniae [K. pneumoniae] as the cause of diseases classified elsewhere: Secondary | ICD-10-CM | POA: Diagnosis present

## 2024-07-07 DIAGNOSIS — B9689 Other specified bacterial agents as the cause of diseases classified elsewhere: Principal | ICD-10-CM | POA: Diagnosis present

## 2024-07-07 DIAGNOSIS — F32A Depression, unspecified: Secondary | ICD-10-CM | POA: Diagnosis present

## 2024-07-07 DIAGNOSIS — Z800 Family history of malignant neoplasm of digestive organs: Secondary | ICD-10-CM

## 2024-07-07 DIAGNOSIS — L4050 Arthropathic psoriasis, unspecified: Secondary | ICD-10-CM | POA: Diagnosis present

## 2024-07-07 DIAGNOSIS — Z79818 Long term (current) use of other agents affecting estrogen receptors and estrogen levels: Secondary | ICD-10-CM

## 2024-07-07 DIAGNOSIS — Z1612 Extended spectrum beta lactamase (ESBL) resistance: Secondary | ICD-10-CM | POA: Diagnosis present

## 2024-07-07 DIAGNOSIS — I10 Essential (primary) hypertension: Secondary | ICD-10-CM | POA: Diagnosis present

## 2024-07-07 DIAGNOSIS — Z8249 Family history of ischemic heart disease and other diseases of the circulatory system: Secondary | ICD-10-CM

## 2024-07-07 DIAGNOSIS — Z7985 Long-term (current) use of injectable non-insulin antidiabetic drugs: Secondary | ICD-10-CM

## 2024-07-07 LAB — BASIC METABOLIC PANEL WITH GFR
Anion gap: 11 (ref 5–15)
BUN: 18 mg/dL (ref 8–23)
CO2: 23 mmol/L (ref 22–32)
Calcium: 9.3 mg/dL (ref 8.9–10.3)
Chloride: 102 mmol/L (ref 98–111)
Creatinine, Ser: 0.91 mg/dL (ref 0.44–1.00)
GFR, Estimated: >60 mL/min (ref >60)
Glucose, Bld: 127 mg/dL — ABNORMAL HIGH (ref 70–99)
Potassium: 4.4 mmol/L (ref 3.5–5.1)
Sodium: 136 mmol/L (ref 135–145)

## 2024-07-07 LAB — URINALYSIS, ROUTINE W REFLEX MICROSCOPIC
Bilirubin Urine: NEGATIVE
Glucose, UA: NEGATIVE mg/dL
Hgb urine dipstick: NEGATIVE
Ketones, ur: NEGATIVE mg/dL
Nitrite: POSITIVE — AB
Protein, ur: NEGATIVE mg/dL
Specific Gravity, Urine: 1.024 (ref 1.005–1.030)
WBC, UA: >50 WBC/hpf (ref 0–5)
pH: 5 (ref 5.0–8.0)

## 2024-07-07 LAB — CBC
HCT: 41.5 % (ref 36.0–46.0)
Hemoglobin: 13.5 g/dL (ref 12.0–15.0)
MCH: 27.3 pg (ref 26.0–34.0)
MCHC: 32.5 g/dL (ref 30.0–36.0)
MCV: 84 fL (ref 80.0–100.0)
Platelets: 173 K/uL (ref 150–400)
RBC: 4.94 MIL/uL (ref 3.87–5.11)
RDW: 14.5 % (ref 11.5–15.5)
WBC: 8.1 K/uL (ref 4.0–10.5)
nRBC: 0 % (ref 0.0–0.2)

## 2024-07-07 MED ORDER — SODIUM CHLORIDE 0.9 % IV SOLN
1.0000 g | Freq: Three times a day (TID) | INTRAVENOUS | Status: DC
  Administered 2024-07-07: 1 g via INTRAVENOUS
  Filled 2024-07-07 (×2): qty 20

## 2024-07-07 NOTE — Telephone Encounter (Signed)
 Pt called in with recurrent UTI symptoms. She's complaining of frequency, urgency, burning with urination, chills and low grade fever. I encourage pt to seek urgent care as we don't have appts today. Pt requested If Dr. Damien prescribed something based off her last culture result. I let her know that I could ask but she might have to be seen tomorrow to have her urine retested. Pt voiced understanding.

## 2024-07-07 NOTE — Telephone Encounter (Signed)
 Called patient back after talking with Dr. Twylla. He suggested because of her multi-drug resistant urine culture that she had and her worsening symptoms she called about that he suggested going to the ED to be seen to then have IV antibiotics after that. Pt was hesitant to going to ED but will talk with husband when he gets home.

## 2024-07-07 NOTE — ED Provider Notes (Signed)
 [facility] Provider Note    Event Date/Time   First MD Initiated Contact with Patient 07/07/24 2258     (approximate)   History   Urinary Frequency   HPI  April Singleton is a 73 y.o. female who presents to the emergency department today at the advice of urologist because of concerns for UTI secondary to recent stent bacteria.  Patient states that she has been dealing with symptoms of a UTI for quite some time.  She finished a 10-day course of Augmentin  3 days ago but had return of symptoms. She complains of abdominal pain, burning with urination, fevers.      Physical Exam   Triage Vital Signs: ED Triage Vitals [07/07/24 1817]  Encounter Vitals Group     BP (!) 154/73     Girls Systolic BP Percentile      Girls Diastolic BP Percentile      Boys Systolic BP Percentile      Boys Diastolic BP Percentile      Pulse Rate 70     Resp 20     Temp 98.3 F (36.8 C)     Temp src      SpO2 97 %     Weight 185 lb (83.9 kg)     Height 5' (1.524 m)     Head Circumference      Peak Flow      Pain Score 2     Pain Loc      Pain Education      Exclude from Growth Chart     Most recent vital signs: Vitals:   07/07/24 1817  BP: (!) 154/73  Pulse: 70  Resp: 20  Temp: 98.3 F (36.8 C)  SpO2: 97%   General: Awake, alert, oriented. CV:  Good peripheral perfusion. Regular rate and rhythm. Resp:  Normal effort. Lungs clear. Abd:  No distention.    ED Results / Procedures / Treatments   Labs (all labs ordered are listed, but only abnormal results are displayed) Labs Reviewed  BASIC METABOLIC PANEL WITH GFR - Abnormal; Notable for the following components:      Result Value   Glucose, Bld 127 (*)    All other components within normal limits  URINALYSIS, ROUTINE W REFLEX MICROSCOPIC - Abnormal; Notable for the following components:   Color, Urine AMBER (*)    APPearance CLOUDY (*)    Nitrite POSITIVE (*)    Leukocytes,Ua MODERATE (*)     Bacteria, UA MANY (*)    All other components within normal limits  CBC     EKG  None   RADIOLOGY None  PROCEDURES:  Critical Care performed: No    MEDICATIONS ORDERED IN ED: Medications - No data to display   IMPRESSION / MDM / ASSESSMENT AND PLAN / ED COURSE  I reviewed the triage vital signs and the nursing notes.                              Differential diagnosis includes, but is not limited to, UTI  Patient's presentation is most consistent with acute presentation with potential threat to life or bodily function.   Patient presented to the emergency department today because of concern for continued UTI. Recent culture shows multi drug resistance. Sent to the ED by urologist for IV fluids. Blood work without concerning leukocytosis, afebrile. Will start meropenem. Will discuss with hospitalist service.  FINAL CLINICAL IMPRESSION(S) / ED DIAGNOSES   Final diagnoses:  Lower urinary tract infectious disease       Note:  This document was prepared using Dragon voice recognition software and may include unintentional dictation errors.    Floy Roberts, MD 07/07/24 2330

## 2024-07-07 NOTE — ED Triage Notes (Signed)
 Pt sent to ED by Dr Twylla for recurrent UTI for possible IV antibiotics. +dysuria, frequency

## 2024-07-08 DIAGNOSIS — Z7985 Long-term (current) use of injectable non-insulin antidiabetic drugs: Secondary | ICD-10-CM | POA: Diagnosis not present

## 2024-07-08 DIAGNOSIS — B9689 Other specified bacterial agents as the cause of diseases classified elsewhere: Secondary | ICD-10-CM | POA: Diagnosis present

## 2024-07-08 DIAGNOSIS — Z8249 Family history of ischemic heart disease and other diseases of the circulatory system: Secondary | ICD-10-CM | POA: Diagnosis not present

## 2024-07-08 DIAGNOSIS — F32A Depression, unspecified: Secondary | ICD-10-CM | POA: Insufficient documentation

## 2024-07-08 DIAGNOSIS — Z79899 Other long term (current) drug therapy: Secondary | ICD-10-CM | POA: Diagnosis not present

## 2024-07-08 DIAGNOSIS — Z885 Allergy status to narcotic agent status: Secondary | ICD-10-CM | POA: Diagnosis not present

## 2024-07-08 DIAGNOSIS — Z6836 Body mass index (BMI) 36.0-36.9, adult: Secondary | ICD-10-CM | POA: Diagnosis not present

## 2024-07-08 DIAGNOSIS — K551 Chronic vascular disorders of intestine: Secondary | ICD-10-CM | POA: Diagnosis present

## 2024-07-08 DIAGNOSIS — L4050 Arthropathic psoriasis, unspecified: Secondary | ICD-10-CM | POA: Diagnosis present

## 2024-07-08 DIAGNOSIS — I2510 Atherosclerotic heart disease of native coronary artery without angina pectoris: Secondary | ICD-10-CM

## 2024-07-08 DIAGNOSIS — E66813 Obesity, class 3: Secondary | ICD-10-CM | POA: Diagnosis present

## 2024-07-08 DIAGNOSIS — Z888 Allergy status to other drugs, medicaments and biological substances status: Secondary | ICD-10-CM | POA: Diagnosis not present

## 2024-07-08 DIAGNOSIS — Z7984 Long term (current) use of oral hypoglycemic drugs: Secondary | ICD-10-CM | POA: Diagnosis not present

## 2024-07-08 DIAGNOSIS — N390 Urinary tract infection, site not specified: Secondary | ICD-10-CM | POA: Diagnosis present

## 2024-07-08 DIAGNOSIS — E1151 Type 2 diabetes mellitus with diabetic peripheral angiopathy without gangrene: Secondary | ICD-10-CM | POA: Diagnosis present

## 2024-07-08 DIAGNOSIS — I110 Hypertensive heart disease with heart failure: Secondary | ICD-10-CM | POA: Diagnosis present

## 2024-07-08 DIAGNOSIS — Z79818 Long term (current) use of other agents affecting estrogen receptors and estrogen levels: Secondary | ICD-10-CM | POA: Diagnosis not present

## 2024-07-08 DIAGNOSIS — Z882 Allergy status to sulfonamides status: Secondary | ICD-10-CM | POA: Diagnosis not present

## 2024-07-08 DIAGNOSIS — B961 Klebsiella pneumoniae [K. pneumoniae] as the cause of diseases classified elsewhere: Secondary | ICD-10-CM | POA: Diagnosis present

## 2024-07-08 DIAGNOSIS — N200 Calculus of kidney: Secondary | ICD-10-CM | POA: Diagnosis not present

## 2024-07-08 DIAGNOSIS — I5032 Chronic diastolic (congestive) heart failure: Secondary | ICD-10-CM

## 2024-07-08 DIAGNOSIS — Z1612 Extended spectrum beta lactamase (ESBL) resistance: Secondary | ICD-10-CM | POA: Diagnosis present

## 2024-07-08 DIAGNOSIS — Z96652 Presence of left artificial knee joint: Secondary | ICD-10-CM | POA: Diagnosis present

## 2024-07-08 DIAGNOSIS — Z87440 Personal history of urinary (tract) infections: Secondary | ICD-10-CM | POA: Diagnosis not present

## 2024-07-08 DIAGNOSIS — Z1624 Resistance to multiple antibiotics: Secondary | ICD-10-CM | POA: Diagnosis present

## 2024-07-08 DIAGNOSIS — J45909 Unspecified asthma, uncomplicated: Secondary | ICD-10-CM | POA: Diagnosis present

## 2024-07-08 LAB — GLUCOSE, CAPILLARY
Glucose-Capillary: 92 mg/dL (ref 70–99)
Glucose-Capillary: 91 mg/dL (ref 70–99)
Glucose-Capillary: 145 mg/dL — ABNORMAL HIGH (ref 70–99)
Glucose-Capillary: 145 mg/dL — ABNORMAL HIGH (ref 70–99)
Glucose-Capillary: 109 mg/dL — ABNORMAL HIGH (ref 70–99)

## 2024-07-08 MED ORDER — GABAPENTIN 100 MG PO CAPS
100.0000 mg | ORAL_CAPSULE | Freq: Two times a day (BID) | ORAL | Status: DC
  Administered 2024-07-08 – 2024-07-09 (×4): 100 mg via ORAL
  Filled 2024-07-08 (×4): qty 1

## 2024-07-08 MED ORDER — ONDANSETRON HCL 4 MG PO TABS: 4.0000 mg | ORAL_TABLET | Freq: Four times a day (QID) | ORAL | Status: DC | PRN

## 2024-07-08 MED ORDER — QUETIAPINE FUMARATE 25 MG PO TABS
25.0000 mg | ORAL_TABLET | Freq: Every day | ORAL | Status: DC
  Administered 2024-07-08 (×2): 25 mg via ORAL
  Filled 2024-07-08 (×2): qty 1

## 2024-07-08 MED ORDER — SODIUM CHLORIDE 0.9 % IV SOLN
1.0000 g | Freq: Three times a day (TID) | INTRAVENOUS | Status: DC
  Administered 2024-07-08 (×2): 1 g via INTRAVENOUS
  Filled 2024-07-08 (×5): qty 20

## 2024-07-08 MED ORDER — EZETIMIBE 10 MG PO TABS
10.0000 mg | ORAL_TABLET | Freq: Every day | ORAL | Status: DC
  Administered 2024-07-08 – 2024-07-09 (×2): 10 mg via ORAL
  Filled 2024-07-08 (×2): qty 1

## 2024-07-08 MED ORDER — HYDROCODONE-ACETAMINOPHEN 5-325 MG PO TABS
1.0000 | ORAL_TABLET | ORAL | Status: DC | PRN
  Administered 2024-07-08: 1 via ORAL
  Filled 2024-07-08: qty 2

## 2024-07-08 MED ORDER — INSULIN ASPART 100 UNIT/ML IJ SOLN: 0.0000 [IU] | Freq: Every day | INTRAMUSCULAR | Status: DC

## 2024-07-08 MED ORDER — HYDROCHLOROTHIAZIDE 12.5 MG PO TABS
12.5000 mg | ORAL_TABLET | Freq: Every day | ORAL | Status: DC
Start: 2024-07-08 — End: 2024-07-09
  Administered 2024-07-08 – 2024-07-09 (×2): 12.5 mg via ORAL
  Filled 2024-07-08 (×2): qty 1

## 2024-07-08 MED ORDER — ROSUVASTATIN CALCIUM 10 MG PO TABS
5.0000 mg | ORAL_TABLET | Freq: Every day | ORAL | Status: DC
Start: 2024-07-08 — End: 2024-07-09
  Administered 2024-07-08 – 2024-07-09 (×2): 5 mg via ORAL
  Filled 2024-07-08 (×2): qty 1

## 2024-07-08 MED ORDER — LAMOTRIGINE 100 MG PO TABS
100.0000 mg | ORAL_TABLET | Freq: Every day | ORAL | Status: DC
  Administered 2024-07-08 – 2024-07-09 (×2): 100 mg via ORAL
  Filled 2024-07-08 (×2): qty 1

## 2024-07-08 MED ORDER — LOSARTAN POTASSIUM 50 MG PO TABS
50.0000 mg | ORAL_TABLET | Freq: Every day | ORAL | Status: DC
  Administered 2024-07-08 – 2024-07-09 (×2): 50 mg via ORAL
  Filled 2024-07-08 (×2): qty 1

## 2024-07-08 MED ORDER — FLUCONAZOLE 100 MG PO TABS: 150.0000 mg | ORAL_TABLET | ORAL | Status: DC | PRN

## 2024-07-08 MED ORDER — VERAPAMIL HCL ER 240 MG PO TBCR
240.0000 mg | EXTENDED_RELEASE_TABLET | Freq: Every day | ORAL | Status: DC
  Administered 2024-07-08 – 2024-07-09 (×2): 240 mg via ORAL
  Filled 2024-07-08 (×2): qty 1

## 2024-07-08 MED ORDER — ENOXAPARIN SODIUM 60 MG/0.6ML IJ SOSY
0.5000 mg/kg | PREFILLED_SYRINGE | INTRAMUSCULAR | Status: DC
  Administered 2024-07-08 – 2024-07-09 (×2): 42.5 mg via SUBCUTANEOUS
  Filled 2024-07-08 (×2): qty 0.6

## 2024-07-08 MED ORDER — ACETAMINOPHEN 650 MG RE SUPP: 650.0000 mg | Freq: Four times a day (QID) | RECTAL | Status: DC | PRN

## 2024-07-08 MED ORDER — SERTRALINE HCL 50 MG PO TABS
200.0000 mg | ORAL_TABLET | Freq: Every day | ORAL | Status: DC
  Administered 2024-07-08 – 2024-07-09 (×2): 200 mg via ORAL
  Filled 2024-07-08 (×2): qty 4

## 2024-07-08 MED ORDER — ONDANSETRON HCL 4 MG/2ML IJ SOLN: 4.0000 mg | Freq: Four times a day (QID) | INTRAMUSCULAR | Status: DC | PRN

## 2024-07-08 MED ORDER — ACETAMINOPHEN 325 MG PO TABS: 650.0000 mg | ORAL_TABLET | Freq: Four times a day (QID) | ORAL | Status: DC | PRN

## 2024-07-08 MED ORDER — MORPHINE SULFATE (PF) 2 MG/ML IV SOLN: 2.0000 mg | INTRAVENOUS | Status: DC | PRN

## 2024-07-08 MED ORDER — PANTOPRAZOLE SODIUM 40 MG PO TBEC
40.0000 mg | DELAYED_RELEASE_TABLET | Freq: Every day | ORAL | Status: DC
  Administered 2024-07-08 – 2024-07-09 (×2): 40 mg via ORAL
  Filled 2024-07-08 (×2): qty 1

## 2024-07-08 MED ORDER — TRAZODONE HCL 50 MG PO TABS
100.0000 mg | ORAL_TABLET | Freq: Every evening | ORAL | Status: DC | PRN
  Administered 2024-07-08: 100 mg via ORAL
  Filled 2024-07-08: qty 2

## 2024-07-08 MED ORDER — INSULIN ASPART 100 UNIT/ML IJ SOLN
0.0000 [IU] | Freq: Three times a day (TID) | INTRAMUSCULAR | Status: DC
  Administered 2024-07-08 (×2): 2 [IU] via SUBCUTANEOUS
  Filled 2024-07-08 (×2): qty 1

## 2024-07-08 NOTE — H&P (Signed)
 History and Physical    Patient: April Singleton FMW:[id number] DOB: [birth year] DOA: 07/07/2024 DOS: the patient was seen and examined on 07/08/2024 PCP: Leavy Mole, PA-C  Patient coming from: Home  Chief Complaint:  Chief Complaint  Patient presents with   Urinary Frequency    HPI: April Singleton is a 73 y.o. female with medical history significant for psoriatic arthritis on Skyrizi(last dose 02/2024), depression, HTN, DM, morbid obesity, nonobstructive CAD, HFpEF, PAD/SMA stenosis, chronic dyspnea (normal echo) frequent UTIs , sent in by urology for inpatient IV antibiotics for multi drug resistant Klebsiella pneumonia UTI (06/22/24), not improving following completion of a 10-day course of Augmentin  which was the only oral option and sensitivity panel.  Patient reports her UTI symptoms of dysuria and frequency returned as soon as she completed the course.  She denies fever or chills or abdominal pain. In the ED afebrile and otherwise normal vitals except for slightly elevated BP of 154/73. CBC and BMP unremarkable Urinalysis with positive nitrites moderate leuks and many bacteria Patient started on meropenem Admission requested     Review of Systems: As mentioned in the history of present illness. All other systems reviewed and are negative.  Past Medical History:  Diagnosis Date   Anemia    Arthritis    Asthma    CAD (coronary artery disease)    a. 05/2024 Cor CTA: Ca2+ = 817 (95th%'ile). LM <25, LAD 25-49p, LCX non-dominant, OM1 <25, RCA dominant 25-49p, 50-44m, 50-69d, RPDA 50-69.  CT FFR of RCA 0.94/0.91/0.80 ->non-obs dzs.   Depression    Diabetes mellitus without complication (HCC)    Diastolic dysfunction    a. 04/2024 Echo: EF 60-65%, GrI DD, nl RV size/fxn, AoV sclerosis w/o stenosis.   GERD (gastroesophageal reflux disease)    Hypertension    Nephrolithiasis    a. 02/2024 CT Abd: 4mm R lower pole renal stone.   Pneumonia    PONV (postoperative nausea and vomiting)     Superior mesenteric artery stenosis    a. 02/2024 CT Abd: sev SMA stenosis.   Past Surgical History:  Procedure Laterality Date   ABDOMINAL HYSTERECTOMY     BREAST BIOPSY     Pt unsure of type of biopsy or even of which side   KNEE ARTHROPLASTY Left 04/29/2022   Procedure: COMPUTER ASSISTED TOTAL KNEE ARTHROPLASTY;  Surgeon: Mardee Lynwood SQUIBB, MD;  Location: [facility] ORS;  Service: Orthopedics;  Laterality: Left;   REDUCTION MAMMAPLASTY     TONSILLECTOMY     Social History:  reports that she has never smoked. She has never used smokeless tobacco. She reports that she does not currently use alcohol. She reports that she does not use drugs.  Allergies  Allergen Reactions   Codeine Other (See Comments)    Nausea and vomiting   Prednisone  Other (See Comments)    Makes her violent , patient reports only in high doses   Sulfa Antibiotics Hives    Family History  Problem Relation Age of Onset   Heart disease Mother    Lung cancer Mother    Hypertension Mother    Congestive Heart Failure Mother    Hypertension Father    Liver cancer Father    Lung cancer Father    Hypertension Sister    Hypertension Brother    Breast cancer Maternal Aunt    Breast cancer Maternal Aunt    Hypertension Maternal Uncle    Hypertension Paternal Aunt    Heart failure Maternal Grandmother  Fainting Maternal Grandfather    Heart disease Maternal Grandfather     Prior to Admission medications   Medication Sig Start Date End Date Taking? Authorizing Provider  acetaminophen  (TYLENOL ) 500 MG tablet Take 1,000 mg by mouth every 6 (six) hours as needed.    [provider]  Blood Glucose Monitoring Suppl (BLOOD GLUCOSE MONITOR SYSTEM) w/Device KIT Use as directed 12/24/23   Patel, Mayur K, MD  clotrimazole -betamethasone  (LOTRISONE ) cream Apply 1 Application topically daily as needed. 03/08/24   Tapia, Leisa, PA-C  diclofenac  (VOLTAREN ) 75 MG EC tablet Take 1 tablet (75 mg total) by mouth 2 (two) times  daily with a meal. 05/17/24     estradiol  (ESTRACE ) 1 MG tablet Take 1 tablet (1 mg total) by mouth daily. 10/15/23     ezetimibe  (ZETIA ) 10 MG tablet Take 1 tablet (10 mg total) by mouth daily. 05/17/24 08/15/24  Darliss Rogue, MD  fluconazole  (DIFLUCAN ) 150 MG tablet Take 1 tablet (150 mg total) by mouth every 3 (three) days as needed (for vaginal itching/yeast infection sx). 04/13/24   Tapia, Leisa, PA-C  gabapentin  (NEURONTIN ) 100 MG capsule Take 1 capsule (100 mg total) by mouth 2 (two) times daily. If no improvement after 5 days increase to 3 times daily 12/11/23     glucose blood test strip Use as directed. 12/24/23   Patel, Mayur K, MD  hydrochlorothiazide  (HYDRODIURIL ) 12.5 MG tablet Take 1 tablet (12.5 mg total) by mouth daily. 03/08/24   Tapia, Leisa, PA-C  ketoconazole  (NIZORAL ) 2 % cream Apply topically 2 (two) times daily To affected areas, use for up to 3-4 weeks 12/30/23     lamoTRIgine  (LAMICTAL ) 100 MG tablet Take 1 tablet (100 mg total) by mouth daily. 05/06/24     losartan  (COZAAR ) 50 MG tablet Take 1 tablet (50 mg total) by mouth daily. 03/08/24   Tapia, Leisa, PA-C  metFORMIN  (GLUCOPHAGE -XR) 500 MG 24 hr tablet Take 1 tablet (500 mg total) by mouth 2 (two) times daily. 03/08/24   Tapia, Leisa, PA-C  ondansetron  (ZOFRAN -ODT) 4 MG disintegrating tablet Take 1 tablet (4 mg total) by mouth every 8 (eight) hours as needed for nausea or vomiting. 04/05/24   Tapia, Leisa, PA-C  OneTouch UltraSoft 2 Lancets MISC Use as directed. 12/24/23   Patel, Mayur K, MD  pantoprazole  (PROTONIX ) 40 MG tablet Take 1 tablet (40 mg total) by mouth daily. 10/17/23     QUEtiapine  (SEROQUEL ) 25 MG tablet Take 1 tablet (25 mg total) by mouth at bedtime. 05/06/24     rosuvastatin  (CRESTOR ) 5 MG tablet Take 1 tablet (5 mg total) by mouth daily. 06/30/24   Vivienne Lonni Ingle, NP  Semaglutide , 1 MG/DOSE, (OZEMPIC , 1 MG/DOSE,) 4 MG/3ML SOPN Inject 1 mg into the skin once a week. 03/08/24   Tapia, Leisa, PA-C  sertraline   (ZOLOFT ) 100 MG tablet Take 2 tablets (200 mg total) by mouth daily with breakfast. 02/04/24     silver  sulfADIAZINE  (SILVADENE ) 1 % cream Apply 1 Application topically daily. 04/26/24     SKYRIZI PEN 150 MG/ML pen Inject 150 mg into the skin. Unsure of dosing frequency 02/28/24   [provider]  traZODone  (DESYREL ) 100 MG tablet Take 1 tablet (100 mg total) by mouth at bedtime as needed. 02/04/24     triamcinolone  cream (KENALOG ) 0.1 % Apply topically 2 (two) times daily. 09/10/23     verapamil  (CALAN -SR) 240 MG CR tablet Take 1 tablet (240 mg total) by mouth daily. 03/08/24   Tapia, Leisa,  PA-C    Physical Exam: Vitals:   07/07/24 1817 07/07/24 2351 07/07/24 2355 07/07/24 2355  BP: (!) 154/73 (!) 155/71    Pulse: 70  68   Resp: 20     Temp: 98.3 F (36.8 C)   98 F (36.7 C)  TempSrc:    Oral  SpO2: 97%  99%   Weight: 83.9 kg     Height: 5' (1.524 m)      Physical Exam Vitals and nursing note reviewed.  Constitutional:      General: She is not in acute distress. HENT:     Head: Normocephalic and atraumatic.  Cardiovascular:     Rate and Rhythm: Normal rate and regular rhythm.     Heart sounds: Normal heart sounds.  Pulmonary:     Effort: Pulmonary effort is normal.     Breath sounds: Normal breath sounds.  Abdominal:     Palpations: Abdomen is soft.     Tenderness: There is no abdominal tenderness.  Neurological:     Mental Status: Mental status is at baseline.     Labs on Admission: I have personally reviewed following labs and imaging studies  CBC: Recent Labs  Lab 07/07/24 1903  WBC 8.1  HGB 13.5  HCT 41.5  MCV 84.0  PLT 173   Basic Metabolic Panel: Recent Labs  Lab 07/07/24 1818  NA 136  K 4.4  CL 102  CO2 23  GLUCOSE 127*  BUN 18  CREATININE 0.91  CALCIUM  9.3   GFR: Estimated Creatinine Clearance: 53.7 mL/min (by C-G formula based on SCr of 0.91 mg/dL). Liver Function Tests: No results for input(s): AST, ALT, ALKPHOS, BILITOT,  PROT, ALBUMIN in the last 168 hours. No results for input(s): LIPASE, AMYLASE in the last 168 hours. No results for input(s): AMMONIA in the last 168 hours. Coagulation Profile: No results for input(s): INR, PROTIME in the last 168 hours. Cardiac Enzymes: No results for input(s): CKTOTAL, CKMB, CKMBINDEX, TROPONINI in the last 168 hours. BNP (last 3 results) No results for input(s): PROBNP in the last 8760 hours. HbA1C: No results for input(s): HGBA1C in the last 72 hours. CBG: No results for input(s): GLUCAP in the last 168 hours. Lipid Profile: No results for input(s): CHOL, HDL, LDLCALC, TRIG, CHOLHDL, LDLDIRECT in the last 72 hours. Thyroid  Function Tests: No results for input(s): TSH, T4TOTAL, FREET4, T3FREE, THYROIDAB in the last 72 hours. Anemia Panel: No results for input(s): VITAMINB12, FOLATE, FERRITIN, TIBC, IRON , RETICCTPCT in the last 72 hours. Urine analysis:    Component Value Date/Time   COLORURINE AMBER (A) 07/07/2024 1820   APPEARANCEUR CLOUDY (A) 07/07/2024 1820   APPEARANCEUR Slightly cloudy 06/22/2024 1323   LABSPEC 1.024 07/07/2024 1820   PHURINE 5.0 07/07/2024 1820   GLUCOSEU NEGATIVE 07/07/2024 1820   HGBUR NEGATIVE 07/07/2024 1820   BILIRUBINUR NEGATIVE 07/07/2024 1820   BILIRUBINUR Negative 06/22/2024 1323   KETONESUR NEGATIVE 07/07/2024 1820   PROTEINUR NEGATIVE 07/07/2024 1820   UROBILINOGEN 0.2 04/13/2024 1127   NITRITE POSITIVE (A) 07/07/2024 1820   LEUKOCYTESUR MODERATE (A) 07/07/2024 1820    Radiological Exams on Admission: No results found. Data Reviewed for HPI: Relevant notes from primary care and specialist visits, past discharge summaries as available in EHR, including Care Everywhere. Prior diagnostic testing as pertinent to current admission diagnoses Updated medications and problem lists for reconciliation ED course, including vitals, labs, imaging, treatment and response  to treatment Triage notes, nursing and pharmacy notes and ED provider's notes Notable results as  noted above in HPI      Assessment and Plan: * UTI due to to multidrug-resistant Klebsiella pneumonia 06/22/24 Recurrent UTIs At risk for severe infection due to recent immunosuppressive therapy No evidence of sepsis Urine culture from 06/22/2024 Failed 10-day course of Augmentin   Meropenem per pharmacy Last dose of Skyrizi was in June-patient states she does not plan to go back on it because she believes it is causing her to have frequent UTIs  CAD (coronary artery disease) Peripheral artery disease SMA stenosis No acute issues suspected Denies chest pain Continue home rosuvastatin  and antiplatelets  Essential hypertension Controlled.  Continue home meds, losartan , verapamil  and HCTZ  Type 2 diabetes mellitus without complications (HCC) Controlled. Sliding scale insulin  coverage Hold Ozempic  and metformin   Chronic heart failure with preserved ejection fraction (HFpEF) (HCC) Clinically euvolemic Continue home GDMT  Depression Continue Zoloft , trazodone , Seroquel   Obesity, Class III, BMI 40-49.9 (morbid obesity) (HCC) BMI 36 with multiple comorbidities Potential complicating factor to overall prognosis and care  Psoriatic arthritis [facility]) Patient self discontinuing Skyrizi.  Last dose of treatment 2025     DVT prophylaxis: Lovenox   Consults: none  Advance Care Planning:   Code Status: Prior   Family Communication: none  Disposition Plan: Back to previous home environment  Severity of Illness: The appropriate patient status for this patient is OBSERVATION. Observation status is judged to be reasonable and necessary in order to provide the required intensity of service to ensure the patient's safety. The patient's presenting symptoms, physical exam findings, and initial radiographic and laboratory data in the context of their medical condition is felt to place them  at decreased risk for further clinical deterioration. Furthermore, it is anticipated that the patient will be medically stable for discharge from the hospital within 2 midnights of admission.   Author: Delayne LULLA Solian, MD 07/08/2024 12:34 AM  For on call review [email or web address].

## 2024-07-08 NOTE — Progress Notes (Signed)
 Pharmacy Antibiotic Note  April Singleton is a 73 y.o. female admitted on 07/07/2024 with UTI and Kleb pneumo in urine cx.  Pharmacy has been consulted for Meropenem dosing.  Plan: Meropenem 1 gm IV X 1 given in ED on 10/8 @ 2355. Meropenem 1 gm IV Q8H ordered to start on 10/9 @ 0800.   Height: 5' (152.4 cm) Weight: 83.9 kg (185 lb) IBW/kg (Calculated) : 45.5  Temp (24hrs), Avg:98.2 F (36.8 C), Min:98 F (36.7 C), Max:98.3 F (36.8 C)  Recent Labs  Lab 07/07/24 1818 07/07/24 1903  WBC  --  8.1  CREATININE 0.91  --     Estimated Creatinine Clearance: 53.7 mL/min (by C-G formula based on SCr of 0.91 mg/dL).    Allergies  Allergen Reactions   Codeine Other (See Comments)    Nausea and vomiting   Prednisone  Other (See Comments)    Makes her violent , patient reports only in high doses   Sulfa Antibiotics Hives    Antimicrobials this admission:   >>    >>   Dose adjustments this admission:   Microbiology results:  BCx:   UCx:    Sputum:    MRSA PCR:   Thank you for allowing pharmacy to be a part of this patient's care.  Mija Effertz D 07/08/2024 12:44 AM

## 2024-07-08 NOTE — Progress Notes (Signed)
 Anticoagulation monitoring(Lovenox ):  73 yo female ordered Lovenox  40 mg Q24h    Filed Weights   07/07/24 1817  Weight: 83.9 kg (185 lb)   BMI 36    Lab Results  Component Value Date   CREATININE 0.91 07/07/2024   CREATININE 1.04 (H) 05/17/2024   CREATININE 0.78 03/22/2024   Estimated Creatinine Clearance: 53.7 mL/min (by C-G formula based on SCr of 0.91 mg/dL). Hemoglobin & Hematocrit     Component Value Date/Time   HGB 13.5 07/07/2024 1903   HGB 13.6 01/05/2014 1614   HCT 41.5 07/07/2024 1903   HCT 40.6 01/05/2014 1614     Per Protocol for Patient with estCrcl > 30 ml/min and BMI > 30, will transition to Lovenox  42.5 mg Q24h.

## 2024-07-08 NOTE — TOC Initial Note (Signed)
 Transition of Care [facility]) - Initial/Assessment Note    Patient Details  Name: April Singleton MRN: [id number] Date of Birth: [birth year]  [facility] CM/SW Contact:    Marinda Cooks, RN Phone Number: 07/08/2024, 3:27 PM  Clinical Narrative:                 This CM spoke with pt introduced role &  completed Initial assessement. Pt A&Ox.4 Pt reports living  with her husband & daughter in a Single Family Ranch Style home  with 2  steps to enter. Pt has family support provided by family  Pt uses [facility]  pharmacy & PCP is listed as  Dr. Raynard Cower . Pt shared she has DME at home that includes  [employer], [employer], & [employer] . Pt reports last  SNF admission was 03/25-06/25 at [facility] . Pt reports no established  HH services prior to this admission . Pt denies any community resources  In place currently, and shared her family can provide DC transportation  . TOC will cont to follow dc planning / care coordination and update as applicable.         Patient Goals and CMS Choice            Expected Discharge Plan and Services                                              Prior Living Arrangements/Services                       Activities of Daily Living   ADL Screening (condition at time of admission) Independently performs ADLs?: Yes (appropriate for developmental age) Is the patient deaf or have difficulty hearing?: No Does the patient have difficulty seeing, even when wearing glasses/contacts?: No Does the patient have difficulty concentrating, remembering, or making decisions?: No  Permission Sought/Granted                  Emotional Assessment              Admission diagnosis:  Lower urinary tract infectious disease [N39.0] Urinary tract infection due to Klebsiella species [N39.0, B96.89] UTI due to Klebsiella species [N39.0, B96.89] Patient Active Problem List   Diagnosis Date Noted   Obesity, Class III, BMI 40-49.9 (morbid obesity) (HCC)  07/08/2024   Chronic heart failure with preserved ejection fraction (HFpEF) (HCC) 07/08/2024   UTI due to Klebsiella species 07/08/2024   CAD (coronary artery disease)    Depression    UTI due to to multidrug-resistant Klebsiella pneumonia 06/22/24 07/07/2024   Iron  deficiency anemia 03/17/2024   Psoriatic arthritis (HCC) 02/20/2023   Total knee replacement status 04/29/2022   Arthritis of right hip 04/05/2022   Trochanteric bursitis of right hip 04/05/2022   Type 2 diabetes mellitus without complications (HCC) 04/01/2022   Primary osteoarthritis of left knee 02/10/2022   Panniculitis 06/12/2021   Primary osteoarthritis of right knee 10/26/2020   Dyslipidemia 07/24/2020   Cirrhosis of liver without ascites (HCC) 07/24/2020   Rotator cuff arthropathy of left shoulder 03/27/2020   Class 2 severe obesity with serious comorbidity and body mass index (BMI) of 36.0 to 36.9 in adult 03/27/2020   Essential hypertension 03/27/2020   Type 2 diabetes mellitus with hyperglycemia, without long-term current use of insulin  (HCC) 03/15/2020   Chronic fatigue 03/25/2018  Chronic foot pain, right 06/10/2016   PCP:  Leavy Mole, PA-C Pharmacy:   [facility] [address] [place] [address] Phone: [phone] Fax: [phone]  [facility] [address] [place] - [address] [place] [address] Phone: [phone] Fax: [phone]     Social Drivers of Health (SDOH) Social History: SDOH Screenings   Food Insecurity: No Food Insecurity (07/08/2024)  Housing: Low Risk  (07/08/2024)  Transportation Needs: No Transportation Needs (07/08/2024)  Utilities: Not At Risk (07/08/2024)  Alcohol Screen: Low Risk  (03/08/2024)  Depression (PHQ2-9): Low Risk  (03/30/2024)  Financial Resource Strain: Low Risk  (03/08/2024)  Physical Activity: Sufficiently Active (03/08/2024)  Social Connections: Moderately Integrated (07/08/2024)   Stress: No Stress Concern Present (03/08/2024)  Tobacco Use: Low Risk  (07/07/2024)  Health Literacy: Adequate Health Literacy (03/08/2024)   SDOH Interventions:     Readmission Risk Interventions     No data to display

## 2024-07-08 NOTE — Assessment & Plan Note (Addendum)
 Patient self discontinuing Skyrizi.  Last dose of treatment 2025

## 2024-07-08 NOTE — Assessment & Plan Note (Signed)
 Continue Zoloft , trazodone , Seroquel 

## 2024-07-08 NOTE — Assessment & Plan Note (Addendum)
 Peripheral artery disease SMA stenosis No acute issues suspected Denies chest pain Continue home rosuvastatin  and antiplatelets

## 2024-07-08 NOTE — Progress Notes (Signed)
 Progress Note   Patient: April Singleton FMW:[id number] DOB: [birth year] DOA: 07/07/2024     0 DOS: the patient was seen and examined on 07/08/2024   Brief hospital course: From HPI KANITRA PURIFOY is a 73 y.o. female with medical history significant for psoriatic arthritis on Skyrizi(last dose 02/2024), depression, HTN, DM, morbid obesity, nonobstructive CAD, HFpEF, PAD/SMA stenosis, chronic dyspnea (normal echo) frequent UTIs , sent in by urology for inpatient IV antibiotics for multi drug resistant Klebsiella pneumonia UTI (06/22/24), not improving following completion of a 10-day course of Augmentin  which was the only oral option and sensitivity panel.  Patient reports her UTI symptoms of dysuria and frequency returned as soon as she completed the course.  She denies fever or chills or abdominal pain. In the ED afebrile and otherwise normal vitals except for slightly elevated BP of 154/73. CBC and BMP unremarkable Urinalysis with positive nitrites moderate leuks and many bacteria Patient started on meropenem     Assessment and Plan: UTI due to to multidrug-resistant Klebsiella pneumonia 06/22/24 Recurrent UTIs At risk for severe infection due to recent immunosuppressive therapy No evidence of sepsis Urine culture from 06/22/2024 Failed 10-day course of Augmentin   Meropenem per pharmacy Infectious disease have been consulted    CAD (coronary artery disease) Peripheral artery disease SMA stenosis No acute issues suspected Denies chest pain Continue rosuvastatin  and antiplatelets   Essential hypertension Controlled.  Continue home meds, losartan , verapamil  and HCTZ   Type 2 diabetes mellitus without complications (HCC) Controlled. Sliding scale insulin  coverage Hold Ozempic  and metformin    Chronic heart failure with preserved ejection fraction (HFpEF) (HCC) Clinically euvolemic Continue home GDMT   Depression Continue Zoloft , trazodone , Seroquel    Obesity, Class III, BMI  40-49.9 (morbid obesity) (HCC) BMI 36 with multiple comorbidities Potential complicating factor to overall prognosis and care   Psoriatic arthritis [facility]) Patient self discontinuing Skyrizi.  Last dose of treatment 2025         DVT prophylaxis: Lovenox    Consults: none   Advance Care Planning:   Code Status: Prior    Family Communication: none   Disposition Plan: Back to previous home environment    Subjective:  Patient seen and examined at bedside this morning Denies nausea vomiting abdominal pain chest pain cough She tells me she is improving  Physical Exam:  Vitals and nursing note reviewed.  Constitutional:      General: She is not in acute distress. HENT:     Head: Normocephalic and atraumatic.  Cardiovascular:     Rate and Rhythm: Normal rate and regular rhythm.     Heart sounds: Normal heart sounds.  Pulmonary:     Effort: Pulmonary effort is normal.     Breath sounds: Normal breath sounds.  Abdominal:     Palpations: Abdomen is soft.     Tenderness: There is no abdominal tenderness.  Neurological:     Mental Status: Mental status is at baseline.   Vitals:   07/08/24 0000 07/08/24 0053 07/08/24 0433 07/08/24 0919  BP: (!) 144/71 (!) 186/65 (!) 117/51 (!) 128/55  Pulse: 68 61 74 73  Resp: (!) 21 16 16 18   Temp:  97.6 F (36.4 C) 97.8 F (36.6 C) 97.7 F (36.5 C)  TempSrc:  Oral Oral   SpO2: 97% 100% 94% 96%  Weight:  84.2 kg    Height:  5' (1.524 m)      Data Reviewed:    Latest Ref Rng & Units 07/07/2024    6:18  PM 05/17/2024    3:54 PM 03/22/2024   11:16 AM  BMP  Glucose 70 - 99 mg/dL 872  851  97   BUN 8 - 23 mg/dL 18  19  20    Creatinine 0.44 - 1.00 mg/dL 9.08  8.95  9.21   BUN/Creat Ratio 12 - 28  18    Sodium 135 - 145 mmol/L 136  137  137   Potassium 3.5 - 5.1 mmol/L 4.4  4.5  3.7   Chloride 98 - 111 mmol/L 102  101  104   CO2 22 - 32 mmol/L 23  22  23    Calcium  8.9 - 10.3 mg/dL 9.3  9.3  9.7        Latest Ref Rng & Units  07/07/2024    7:03 PM 03/22/2024   11:16 AM 03/08/2024    2:28 PM  CBC  WBC 4.0 - 10.5 K/uL 8.1  6.4  7.4   Hemoglobin 12.0 - 15.0 g/dL 86.4  89.6  89.7   Hematocrit 36.0 - 46.0 % 41.5  34.5  33.8   Platelets 150 - 400 K/uL 173  242  277      Family Communication: Discussed with husband at bedside  Disposition: Status is: Inpatient   Planned Discharge Destination:  Time spent: 51 minutes  Author: Drue ONEIDA Potter, MD 07/08/2024 3:32 PM  For on call review [email or web address].

## 2024-07-08 NOTE — Assessment & Plan Note (Signed)
 Clinically euvolemic Continue home GDMT

## 2024-07-08 NOTE — Assessment & Plan Note (Signed)
 Controlled. Sliding scale insulin  coverage Hold Ozempic  and metformin 

## 2024-07-08 NOTE — Plan of Care (Signed)
  Problem: Coping: Goal: Ability to adjust to condition or change in health will improve Outcome: Progressing   Problem: Fluid Volume: Goal: Ability to maintain a balanced intake and output will improve Outcome: Progressing   Problem: Nutritional: Goal: Progress toward achieving an optimal weight will improve Outcome: Progressing   Problem: Clinical Measurements: Goal: Ability to maintain clinical measurements within normal limits will improve Outcome: Progressing   Problem: Pain Managment: Goal: General experience of comfort will improve and/or be controlled Outcome: Progressing   Problem: Safety: Goal: Ability to remain free from injury will improve Outcome: Progressing

## 2024-07-08 NOTE — Assessment & Plan Note (Addendum)
 Recurrent UTIs At risk for severe infection due to recent immunosuppressive therapy No evidence of sepsis Urine culture from 06/22/2024 Failed 10-day course of Augmentin   Meropenem per pharmacy Last dose of Skyrizi was in June-patient states she does not plan to go back on it because she believes it is causing her to have frequent UTIs

## 2024-07-08 NOTE — Assessment & Plan Note (Signed)
 Controlled.  Continue home meds, losartan , verapamil  and HCTZ

## 2024-07-08 NOTE — Consult Note (Signed)
 NAME: April Singleton  DOB: [birth year]  MRN: [id number]  Date/Time: 07/08/2024 9:59 PM  REQUESTING PROVIDER: Dr.Djan Subjective:  REASON FOR CONSULT: ESBL klebsiella UTI ? April Singleton is a 73 y.o. with a history of DM, Depression, HTN, CAD, r uti , left TKA was asked to go to the ED by her urologist Dr.Stoioff for IV antibiotics due to ESBL klebsiella- pt was recently on augmentin , before that was taking Doxy for 2 months- not much of relief of symptoms ike frequency, urgency, dysuria and some time back pain with antibiotics On ozempic  , has lost 70 pounds- started losing weight even before he went on ozempic  Says he has fever sometimes when she gets UTI Pt is currently on meropenem I am asked to see patient for management of infection    Past Medical History:  Diagnosis Date   Anemia    Arthritis    Asthma    CAD (coronary artery disease)    a. 05/2024 Cor CTA: Ca2+ = 817 (95th%'ile). LM <25, LAD 25-49p, LCX non-dominant, OM1 <25, RCA dominant 25-49p, 50-67m, 50-69d, RPDA 50-69.  CT FFR of RCA 0.94/0.91/0.80 ->non-obs dzs.   Depression    Diabetes mellitus without complication (HCC)    Diastolic dysfunction    a. 04/2024 Echo: EF 60-65%, GrI DD, nl RV size/fxn, AoV sclerosis w/o stenosis.   GERD (gastroesophageal reflux disease)    Hypertension    Nephrolithiasis    a. 02/2024 CT Abd: 4mm R lower pole renal stone.   Pneumonia    PONV (postoperative nausea and vomiting)    Superior mesenteric artery stenosis    a. 02/2024 CT Abd: sev SMA stenosis.    Past Surgical History:  Procedure Laterality Date   ABDOMINAL HYSTERECTOMY     BREAST BIOPSY     Pt unsure of type of biopsy or even of which side   KNEE ARTHROPLASTY Left 04/29/2022   Procedure: COMPUTER ASSISTED TOTAL KNEE ARTHROPLASTY;  Surgeon: Mardee Lynwood SQUIBB, MD;  Location: [facility] ORS;  Service: Orthopedics;  Laterality: Left;   REDUCTION MAMMAPLASTY     TONSILLECTOMY      Social History   Socioeconomic History   Marital  status: Married    Spouse name: Danny   Number of children: 3   Years of education: Not on file   Highest education level: Not on file  Occupational History   Not on file  Tobacco Use   Smoking status: Never   Smokeless tobacco: Never  Vaping Use   Vaping status: Never Used  Substance and Sexual Activity   Alcohol use: Not Currently    Comment: rarely   Drug use: Never   Sexual activity: Yes    Birth control/protection: Surgical    Comment: Hysterectomy  Other Topics Concern   Not on file  Social History Narrative   3 grandchildren and daughter in the home   Social Drivers of Health   Financial Resource Strain: Low Risk  (03/08/2024)   Overall Financial Resource Strain (CARDIA)    Difficulty of Paying Living Expenses: Not hard at all  Food Insecurity: No Food Insecurity (07/08/2024)   Hunger Vital Sign    Worried About Running Out of Food in the Last Year: Never true    Ran Out of Food in the Last Year: Never true  Transportation Needs: No Transportation Needs (07/08/2024)   PRAPARE - Administrator, Civil Service (Medical): No    Lack of Transportation (Non-Medical): No  Physical Activity: Sufficiently Active (  03/08/2024)   Exercise Vital Sign    Days of Exercise per Week: 7 days    Minutes of Exercise per Session: 30 min  Stress: No Stress Concern Present (03/08/2024)   [employer] of Occupational Health - Occupational Stress Questionnaire    Feeling of Stress : Not at all  Social Connections: Moderately Integrated (07/08/2024)   Social Connection and Isolation Panel    Frequency of Communication with Friends and Family: More than three times a week    Frequency of Social Gatherings with Friends and Family: More than three times a week    Attends Religious Services: More than 4 times per year    Active Member of [employer] or Organizations: No    Attends Banker Meetings: Never    Marital Status: Married  Catering manager Violence: Not At Risk  (07/08/2024)   Humiliation, Afraid, Rape, and Kick questionnaire    Fear of Current or Ex-Partner: No    Emotionally Abused: No    Physically Abused: No    Sexually Abused: No    Family History  Problem Relation Age of Onset   Heart disease Mother    Lung cancer Mother    Hypertension Mother    Congestive Heart Failure Mother    Hypertension Father    Liver cancer Father    Lung cancer Father    Hypertension Sister    Hypertension Brother    Breast cancer Maternal Aunt    Breast cancer Maternal Aunt    Hypertension Maternal Uncle    Hypertension Paternal Aunt    Heart failure Maternal Grandmother    Fainting Maternal Grandfather    Heart disease Maternal Grandfather    Allergies  Allergen Reactions   Codeine Other (See Comments)    Nausea and vomiting   Prednisone  Other (See Comments)    Makes her violent , patient reports only in high doses   Sulfa Antibiotics Hives   I? Current Facility-Administered Medications  Medication Dose Route Frequency Provider Last Rate Last Admin   acetaminophen  (TYLENOL ) tablet 650 mg  650 mg Oral Q6H PRN Duncan, Hazel V, MD       Or   acetaminophen  (TYLENOL ) suppository 650 mg  650 mg Rectal Q6H PRN Duncan, Hazel V, MD       enoxaparin  (LOVENOX ) injection 42.5 mg  0.5 mg/kg Subcutaneous Q24H Duncan, Hazel V, MD   42.5 mg at 07/08/24 9078   ezetimibe  (ZETIA ) tablet 10 mg  10 mg Oral Daily Duncan, Hazel V, MD   10 mg at 07/08/24 9081   fluconazole  (DIFLUCAN ) tablet 150 mg  150 mg Oral Q72H PRN Duncan, Hazel V, MD       gabapentin  (NEURONTIN ) capsule 100 mg  100 mg Oral BID Cleatus Hoof V, MD   100 mg at 07/08/24 2135   hydrochlorothiazide  (HYDRODIURIL ) tablet 12.5 mg  12.5 mg Oral Daily Duncan, Hazel V, MD   12.5 mg at 07/08/24 9082   HYDROcodone -acetaminophen  (NORCO/VICODIN) 5-325 MG per tablet 1-2 tablet  1-2 tablet Oral Q4H PRN Duncan, Hazel V, MD   1 tablet at 07/08/24 2137   insulin  aspart (novoLOG ) injection 0-15 Units  0-15 Units  Subcutaneous TID WC Duncan, Hazel V, MD   2 Units at 07/08/24 1715   insulin  aspart (novoLOG ) injection 0-5 Units  0-5 Units Subcutaneous QHS Cleatus Hoof GAILS, MD       lamoTRIgine  (LAMICTAL ) tablet 100 mg  100 mg Oral Daily Duncan, Hazel V, MD   100 mg at  07/08/24 0917   losartan  (COZAAR ) tablet 50 mg  50 mg Oral Daily Cleatus Delayne GAILS, MD   50 mg at 07/08/24 0918   meropenem (MERREM) 1 g in sodium chloride  0.9 % 100 mL IVPB  1 g Intravenous Q8H Cleatus Delayne V, MD 200 mL/hr at 07/08/24 1624 1 g at 07/08/24 1624   morphine  (PF) 2 MG/ML injection 2 mg  2 mg Intravenous Q2H PRN Cleatus Delayne GAILS, MD       ondansetron  (ZOFRAN ) tablet 4 mg  4 mg Oral Q6H PRN Cleatus Delayne GAILS, MD       Or   ondansetron  (ZOFRAN ) injection 4 mg  4 mg Intravenous Q6H PRN Cleatus Delayne GAILS, MD       pantoprazole  (PROTONIX ) EC tablet 40 mg  40 mg Oral Daily Cleatus Delayne GAILS, MD   40 mg at 07/08/24 9082   QUEtiapine  (SEROQUEL ) tablet 25 mg  25 mg Oral QHS Cleatus Delayne GAILS, MD   25 mg at 07/08/24 2135   rosuvastatin  (CRESTOR ) tablet 5 mg  5 mg Oral Daily Cleatus Delayne GAILS, MD   5 mg at 07/08/24 9082   sertraline  (ZOLOFT ) tablet 200 mg  200 mg Oral Q breakfast Cleatus Delayne GAILS, MD   200 mg at 07/08/24 9082   traZODone  (DESYREL ) tablet 100 mg  100 mg Oral QHS PRN Cleatus Delayne GAILS, MD   100 mg at 07/08/24 2138   verapamil  (CALAN -SR) CR tablet 240 mg  240 mg Oral Daily Cleatus Delayne GAILS, MD   240 mg at 07/08/24 9081     Abtx:  Anti-infectives (From admission, onward)    Start     Dose/Rate Route Frequency Ordered Stop   07/08/24 0800  meropenem (MERREM) 1 g in sodium chloride  0.9 % 100 mL IVPB        1 g 200 mL/hr over 30 Minutes Intravenous Every 8 hours 07/08/24 0044     07/08/24 0034  fluconazole  (DIFLUCAN ) tablet 150 mg        150 mg Oral Every 72 hours PRN 07/08/24 0036     07/07/24 2330  meropenem (MERREM) 1 g in sodium chloride  0.9 % 100 mL IVPB  Status:  Discontinued        1 g 200 mL/hr over 30 Minutes Intravenous Every 8  hours 07/07/24 2325 07/08/24 0044       REVIEW OF SYSTEMS:  Const:  fever,  chills, +weight loss Eyes: negative diplopia or visual changes, negative eye pain ENT: negative coryza, negative sore throat Resp: negative cough, hemoptysis, dyspnea Cards: negative for chest pain, palpitations, lower extremity edema GU: as above GI: Negative for abdominal pain, diarrhea, bleeding, constipation Skin: negative for rash and pruritus Heme: negative for easy bruising and gum/nose bleeding MS: weakness, body ache Neurolo:negative for headaches,  some timesdizziness, no vertigo, memory problems  Psych:, depression  Endocrine: diabetes Allergy/Immunology- as above Including sulfa Objective:  VITALS:  BP 120/85 (BP Location: Right Arm)   Pulse 77   Temp 98.1 F (36.7 C) (Oral)   Resp 16   Ht 5' (1.524 m)   Wt 84.2 kg   SpO2 97%   BMI 36.25 kg/m   PHYSICAL EXAM:  General: Alert, cooperative, no distress, appears stated age.  Head: Normocephalic, without obvious abnormality, atraumatic. Eyes: Conjunctivae clear, anicteric sclerae. Pupils are equal ENT Nares normal. No drainage or sinus tenderness. Lips, mucosa, and tongue normal. No Thrush Neck: Supple, symmetrical, no adenopathy, thyroid : non tender no carotid bruit and no  JVD. Back: No CVA tenderness. Lungs: Clear to auscultation bilaterally. No Wheezing or Rhonchi. No rales. Heart: Regular rate and rhythm, no murmur, rub or gallop. Abdomen: Soft, non-tender,not distended. Bowel sounds normal. No masses Extremities: atraumatic, no cyanosis. No edema. No clubbing Skin: No rashes or lesions. Or bruising Lymph: Cervical, supraclavicular normal. Neurologic: Grossly non-focal Pertinent Labs Lab Results CBC    Component Value Date/Time   WBC 8.1 07/07/2024 1903   RBC 4.94 07/07/2024 1903   HGB 13.5 07/07/2024 1903   HGB 13.6 01/05/2014 1614   HCT 41.5 07/07/2024 1903   HCT 40.6 01/05/2014 1614   PLT 173 07/07/2024 1903   PLT  137 (L) 01/05/2014 1614   MCV 84.0 07/07/2024 1903   MCV 87 01/05/2014 1614   MCH 27.3 07/07/2024 1903   MCHC 32.5 07/07/2024 1903   RDW 14.5 07/07/2024 1903   RDW 12.8 01/05/2014 1614   LYMPHSABS 1.6 03/22/2024 1116   LYMPHSABS 2.2 01/05/2014 1614   MONOABS 0.6 03/22/2024 1116   MONOABS 0.6 01/05/2014 1614   EOSABS 0.1 03/22/2024 1116   EOSABS 0.1 01/05/2014 1614   BASOSABS 0.1 03/22/2024 1116   BASOSABS 0.0 01/05/2014 1614       Latest Ref Rng & Units 07/07/2024    6:18 PM 05/17/2024    3:54 PM 03/22/2024   11:16 AM  CMP  Glucose 70 - 99 mg/dL 872  851  97   BUN 8 - 23 mg/dL 18  19  20    Creatinine 0.44 - 1.00 mg/dL 9.08  8.95  9.21   Sodium 135 - 145 mmol/L 136  137  137   Potassium 3.5 - 5.1 mmol/L 4.4  4.5  3.7   Chloride 98 - 111 mmol/L 102  101  104   CO2 22 - 32 mmol/L 23  22  23    Calcium  8.9 - 10.3 mg/dL 9.3  9.3  9.7   Total Protein 6.5 - 8.1 g/dL   7.8   Total Bilirubin 0.0 - 1.2 mg/dL   0.6   Alkaline Phos 38 - 126 U/L   55   AST 15 - 41 U/L   25   ALT 0 - 44 U/L   19       Microbiology: 06/22/24  Klebsiella pneumoniae   IMAGING RESULTS: None this admission  ? Impression/Recommendation ?Recurrent symptoms of frequency , urgency and dysuria Uti with symptoms of Genitourinary syndrome of menopause Possibility of OAB  Pt currently has ESBL klebsiella UTI Recent PVR is zero- done at urologist office ?She is colonized with this organism Rt 4mm lower pole calculi  Treat with carbapenem for 7 days She needs to follow the steps below to prevent UTI  ?*Estrace  /Premarin cream topically- peasized apply topically three times a week * Cetaphil to clean the genital area ( not soap)  * Cranberry supplement (-Knudsen cranberry concentrate- 1 ounce mixed with 8 ounces of water *wash with water after bowel movt *Probiotic for vaginal health( can try Pearls vaginal health) * Increase water consumption- 8 glasses a day *Ask your doctors not to check your  urine on a routine basis  *Avoid antibiotics unless systemic infection/ or before cystoscopy * Kegel Exercise to strengthen pelvic floor  _ have personally spent  60-minutes involved in face-to-face and non-face-to-face activities for this patient on the day of the visit. Professional time spent includes the following activities: Preparing to see the patient (review of tests), Obtaining and/or reviewing separately obtained history (admission/discharge record), Performing a medically  appropriate examination and/or evaluation , Ordering medications/tests/procedures, referring and communicating with other health care professionals, Documenting clinical information in the EMR, Independently interpreting results (not separately reported), Communicating results to the patient Counseling and educating the patient/r and Care coordination (not separately reported).    ________________________________________________ Discussed with patient, requesting provider Note:  This document was prepared using Dragon voice recognition software and may include unintentional dictation errors.

## 2024-07-08 NOTE — Assessment & Plan Note (Signed)
 BMI 36 with multiple comorbidities Potential complicating factor to overall prognosis and care

## 2024-07-09 ENCOUNTER — Other Ambulatory Visit: Payer: Self-pay

## 2024-07-09 DIAGNOSIS — A498 Other bacterial infections of unspecified site: Secondary | ICD-10-CM

## 2024-07-09 DIAGNOSIS — N390 Urinary tract infection, site not specified: Secondary | ICD-10-CM | POA: Diagnosis not present

## 2024-07-09 DIAGNOSIS — B9689 Other specified bacterial agents as the cause of diseases classified elsewhere: Secondary | ICD-10-CM | POA: Diagnosis not present

## 2024-07-09 LAB — BASIC METABOLIC PANEL WITH GFR
Anion gap: 7 (ref 5–15)
BUN: 16 mg/dL (ref 8–23)
CO2: 28 mmol/L (ref 22–32)
Calcium: 9.1 mg/dL (ref 8.9–10.3)
Chloride: 104 mmol/L (ref 98–111)
Creatinine, Ser: 1.05 mg/dL — ABNORMAL HIGH (ref 0.44–1.00)
GFR, Estimated: 56 mL/min — ABNORMAL LOW (ref >60)
Glucose, Bld: 95 mg/dL (ref 70–99)
Potassium: 3.9 mmol/L (ref 3.5–5.1)
Sodium: 139 mmol/L (ref 135–145)

## 2024-07-09 LAB — CBC WITH DIFFERENTIAL/PLATELET
Abs Immature Granulocytes: 0.01 K/uL (ref 0.00–0.07)
Basophils Absolute: 0 K/uL (ref 0.0–0.1)
Basophils Relative: 1 %
Eosinophils Absolute: 0.2 K/uL (ref 0.0–0.5)
Eosinophils Relative: 3 %
HCT: 39.9 % (ref 36.0–46.0)
Hemoglobin: 13.1 g/dL (ref 12.0–15.0)
Immature Granulocytes: 0 %
Lymphocytes Relative: 31 %
Lymphs Abs: 1.6 K/uL (ref 0.7–4.0)
MCH: 27.8 pg (ref 26.0–34.0)
MCHC: 32.8 g/dL (ref 30.0–36.0)
MCV: 84.7 fL (ref 80.0–100.0)
Monocytes Absolute: 0.6 K/uL (ref 0.1–1.0)
Monocytes Relative: 11 %
Neutro Abs: 2.9 K/uL (ref 1.7–7.7)
Neutrophils Relative %: 54 %
Platelets: 155 K/uL (ref 150–400)
RBC: 4.71 MIL/uL (ref 3.87–5.11)
RDW: 14.2 % (ref 11.5–15.5)
WBC: 5.3 K/uL (ref 4.0–10.5)
nRBC: 0 % (ref 0.0–0.2)

## 2024-07-09 LAB — GLUCOSE, CAPILLARY: Glucose-Capillary: 93 mg/dL (ref 70–99)

## 2024-07-09 MED ORDER — SODIUM CHLORIDE 0.9% FLUSH: 10.0000 mL | INTRAVENOUS | Status: DC | PRN

## 2024-07-09 MED ORDER — ERTAPENEM IV (FOR PTA / DISCHARGE USE ONLY): 1.0000 g | INTRAVENOUS | 0 refills | Status: AC

## 2024-07-09 MED ORDER — SODIUM CHLORIDE 0.9 % IV SOLN
1.0000 g | INTRAVENOUS | Status: DC
  Administered 2024-07-09: 1 g via INTRAVENOUS
  Filled 2024-07-09: qty 1000

## 2024-07-09 MED ORDER — SODIUM CHLORIDE 0.9% FLUSH
10.0000 mL | Freq: Two times a day (BID) | INTRAVENOUS | Status: DC
  Administered 2024-07-09: 10 mL

## 2024-07-09 NOTE — Progress Notes (Signed)
 PHARMACY CONSULT NOTE FOR:  OUTPATIENT  PARENTERAL ANTIBIOTIC THERAPY (OPAT)  Indication: ESBL K. Pneumoniae UTI Regimen: Ertapenem 1gm IV q24h End date: 07/16/2024  Labs - Once weekly:  CBC/D and CMP Fax weekly lab results  promptly to [phone] Please pull PICC/midline at completion of IV antibiotics Call [phone] with questions or critical value  IV antibiotic discharge orders are pended. To discharging provider:  please sign these orders via discharge navigator,  Select New Orders & click on the button choice - Manage This Unsigned Work.     Thank you for allowing pharmacy to be a part of this patient's care.  Shaakira Borrero, PharmD, BCPS, BCIDP Work Cell: [phone] 07/09/2024 8:15 AM

## 2024-07-09 NOTE — TOC Transition Note (Signed)
 Transition of Care [facility]) - Discharge Note   Patient Details  Name: April Singleton MRN: [id number] Date of Birth: 1950/12/28  [facility] CM/SW Contact:  Racheal LITTIE Schimke, RN Phone Number: 07/09/2024, 4:37 PM   Final next level of care: Home w Home Health Services Barriers to Discharge: Barriers Resolved   Patient Goals and CMS Choice Patient states their goals for this hospitalization and ongoing recovery are:: Home with [facility] and Home Infusion   Choice offered to / list presented to : Patient      Discharge Placement                    Patient and family notified of of transfer: 07/09/24  Discharge Plan and Services Additional resources added to the After Visit Summary for                            [facility] Arranged: IV Antibiotics, RN [facility] Agency: [facility] Date [facility] Agency Contacted: 07/09/24   Representative spoke with at [facility] Agency: Joane  Social Drivers of Health (SDOH) Interventions SDOH Screenings   Food Insecurity: No Food Insecurity (07/08/2024)  Housing: Low Risk  (07/08/2024)  Transportation Needs: No Transportation Needs (07/08/2024)  Utilities: Not At Risk (07/08/2024)  Alcohol Screen: Low Risk  (03/08/2024)  Depression (PHQ2-9): Low Risk  (03/30/2024)  Financial Resource Strain: Low Risk  (03/08/2024)  Physical Activity: Sufficiently Active (03/08/2024)  Social Connections: Moderately Integrated (07/08/2024)  Stress: No Stress Concern Present (03/08/2024)  Tobacco Use: Low Risk  (07/07/2024)  Health Literacy: Adequate Health Literacy (03/08/2024)     Readmission Risk Interventions     No data to display

## 2024-07-09 NOTE — Progress Notes (Signed)
 Date of Admission:  07/07/2024      ID: April Singleton is a 73 y.o. female  Principal Problem:   UTI due to to multidrug-resistant Klebsiella pneumonia 06/22/24 Active Problems:   Essential hypertension   Psoriatic arthritis (HCC)   Type 2 diabetes mellitus without complications (HCC)   Obesity, Class III, BMI 40-49.9 (morbid obesity) (HCC)   CAD (coronary artery disease)   Chronic heart failure with preserved ejection fraction (HFpEF) (HCC)   Depression   UTI due to Klebsiella species    Subjective: Doing well No complaints  Medications:   enoxaparin  (LOVENOX ) injection  0.5 mg/kg Subcutaneous Q24H   ezetimibe   10 mg Oral Daily   gabapentin   100 mg Oral BID   hydrochlorothiazide   12.5 mg Oral Daily   insulin  aspart  0-15 Units Subcutaneous TID WC   insulin  aspart  0-5 Units Subcutaneous QHS   lamoTRIgine   100 mg Oral Daily   losartan   50 mg Oral Daily   pantoprazole   40 mg Oral Daily   QUEtiapine   25 mg Oral QHS   rosuvastatin   5 mg Oral Daily   sertraline   200 mg Oral Q breakfast   sodium chloride  flush  10-40 mL Intracatheter Q12H   verapamil   240 mg Oral Daily    Objective: Vital signs in last 24 hours: Patient Vitals for the past 24 hrs:  BP Temp Temp src Pulse Resp SpO2  07/09/24 0750 (!) 110/46 97.8 F (36.6 C) Oral -- 14 92 %  07/09/24 0442 (!) 126/56 97.6 F (36.4 C) Oral 66 16 93 %  07/08/24 2006 120/85 98.1 F (36.7 C) Oral 77 16 97 %  07/08/24 1651 (!) 128/46 (!) 97.5 F (36.4 C) -- 68 18 97 %     PHYSICAL EXAM:  General: Alert, cooperative, no distress, appears stated age.  Head: Normocephalic, without obvious abnormality, atraumatic. Eyes: Conjunctivae clear, anicteric sclerae. Pupils are equal ENT Nares normal. No drainage or sinus tenderness. Lips, mucosa, and tongue normal. No Thrush Neck: Supple, symmetrical, no adenopathy, thyroid : non tender no carotid bruit and no JVD. Lungs: Clear to auscultation bilaterally. No Wheezing or  Rhonchi. No rales. Heart: Regular rate and rhythm, no murmur, rub or gallop. Abdomen: Soft, non-tender,not distended. Bowel sounds normal. No masses Extremities: atraumatic, no cyanosis. No edema. No clubbing Skin: No rashes or lesions. Or bruising Lymph: Cervical, supraclavicular normal. Neurologic: Grossly non-focal  Lab Results    Latest Ref Rng & Units 07/09/2024    4:41 AM 07/07/2024    7:03 PM 03/22/2024   11:16 AM  CBC  WBC 4.0 - 10.5 K/uL 5.3  8.1  6.4   Hemoglobin 12.0 - 15.0 g/dL 86.8  86.4  89.6   Hematocrit 36.0 - 46.0 % 39.9  41.5  34.5   Platelets 150 - 400 K/uL 155  173  242        Latest Ref Rng & Units 07/09/2024    4:41 AM 07/07/2024    6:18 PM 05/17/2024    3:54 PM  CMP  Glucose 70 - 99 mg/dL 95  872  851   BUN 8 - 23 mg/dL 16  18  19    Creatinine 0.44 - 1.00 mg/dL 8.94  9.08  8.95   Sodium 135 - 145 mmol/L 139  136  137   Potassium 3.5 - 5.1 mmol/L 3.9  4.4  4.5   Chloride 98 - 111 mmol/L 104  102  101   CO2 22 - 32 mmol/L  28  23  22    Calcium  8.9 - 10.3 mg/dL 9.1  9.3  9.3       Microbiology: 06/22/24 Klebsiella pneumoniae Studies/Results: US  EKG SITE RITE Result Date: 07/09/2024 If Site Rite image not attached, placement could not be confirmed due to current cardiac rhythm.    Assessment/Plan: Recurrent symptoms of frequency, urgency and dysuria. Being treated as UTI.  She has symptoms of genitourinary syndrome of menopause Possibly overactive bladder  She is now has ESBL Klebsiella pneumonia in the urine Followed by urologist and they referred her to the ED Recent PVR done in the office was 0 She also has a right sided 4 mm lower pole calculi Will treat her with 7 days of carbapenem OPAT orders placed  Gave her information to prevent UTI *Estrace  /Premarin cream topically- peasized apply topically three times a week * Cetaphil to clean the genital area ( not soap)  * Cranberry supplement (-Knudsen cranberry concentrate- 1 ounce mixed with  8 ounces of water *wash with water after bowel movt *Probiotic for vaginal health( can try Pearls vaginal health) * Increase water consumption- 8 glasses a day *Ask your doctors not to check your urine on a routine basis  *Avoid antibiotics unless systemic infection/ or before cystoscopy * Kegel Exercise to strengthen pelvic floor  ID will sign off.  Call if needed.

## 2024-07-09 NOTE — Progress Notes (Signed)

## 2024-07-09 NOTE — Treatment Plan (Signed)
 Diagnosis: ESBL klebsiella UTI Baseline Creatinine <1    Allergies  Allergen Reactions   Codeine Other (See Comments)    Nausea and vomiting   Prednisone  Other (See Comments)    Makes her violent , patient reports only in high doses   Sulfa Antibiotics Hives    OPAT Orders Discharge antibiotics: Ertapenem 1 gram Iv every 24 hrs  Duration: 7 days End Date: 07/16/24  [facility] Care Per Protocol:  Labs weekly while on IV antibiotics: X__ CBC with differential __ BMP X__ CMP __ CRP __ ESR __ Vancomycin  trough  _X_ Please pull PIC at completion of IV antibiotics __ Please leave PIC in place until doctor has seen patient or been notified  Fax weekly lab results  promptly to [phone]  Clinic Follow Up Appt:not required with ID   Call [phone] with questions or critical value

## 2024-07-09 NOTE — Discharge Summary (Signed)
 Physician Discharge Summary   Patient: April Singleton MRN: [id number] DOB: November 14, 1950  Admit date:     07/07/2024  Discharge date: 07/09/24  Discharge Physician: Drue ONEIDA Potter   PCP: Leavy Mole, PA-C   Recommendations at discharge:   Follow-up with ID  Discharge Diagnoses: Principal Problem:   UTI due to to multidrug-resistant Klebsiella pneumonia 06/22/24 Active Problems:   CAD (coronary artery disease)   Essential hypertension   Type 2 diabetes mellitus without complications (HCC)   Chronic heart failure with preserved ejection fraction (HFpEF) (HCC)   Psoriatic arthritis (HCC)   Obesity, Class III, BMI 40-49.9 (morbid obesity) (HCC)   Depression   UTI due to Klebsiella species  Resolved Problems:   * No resolved hospital problems. [facility] Course:  From HPI April Singleton is a 73 y.o. female with medical history significant for psoriatic arthritis on Skyrizi(last dose 02/2024), depression, HTN, DM, morbid obesity, nonobstructive CAD, HFpEF, PAD/SMA stenosis, chronic dyspnea (normal echo) frequent UTIs , sent in by urology for inpatient IV antibiotics for multi drug resistant Klebsiella pneumonia UTI (06/22/24), not improving following completion of a 10-day course of Augmentin  which was the only oral option and sensitivity panel.  Patient reports her UTI symptoms of dysuria and frequency returned as soon as she completed the course.  She denies fever or chills or abdominal pain. In the ED afebrile and otherwise normal vitals except for slightly elevated BP of 154/73. CBC and BMP unremarkable Urinalysis with positive nitrites moderate leuks and many bacteria Patient started on meropenem    Other hospital course as noted below   Assessment and Plan: UTI due to to multidrug-resistant Klebsiella pneumonia 06/22/24 Recurrent UTIs At risk for severe infection due to recent immunosuppressive therapy No evidence of sepsis Urine culture from 06/22/2024 Failed 10-day course  of Augmentin   Patient seen by ID and been recommended for IV ertapenem to complete course She will follow-up with ID as an outpatient     CAD (coronary artery disease) Peripheral artery disease SMA stenosis No acute issues suspected Denies chest pain Continue rosuvastatin  and antiplatelets   Essential hypertension Controlled.  Continue home meds, losartan , verapamil  and HCTZ   Type 2 diabetes mellitus without complications (HCC) Controlled. Sliding scale insulin  coverage Hold Ozempic  and metformin  while inpatient   Chronic heart failure with preserved ejection fraction (HFpEF) (HCC) Clinically euvolemic Continue home GDMT   Depression Continue Zoloft , trazodone , Seroquel    Obesity, Class III, BMI 40-49.9 (morbid obesity) (HCC) BMI 36 with multiple comorbidities Potential complicating factor to overall prognosis and care   Psoriatic arthritis [facility]) Patient self discontinuing Skyrizi.  Last dose of treatment 2025  Consultants: Infectious disease Procedures performed: Midline placement Disposition: Home Diet recommendation:  Cardiac diet DISCHARGE MEDICATION: Allergies as of 07/09/2024       Reactions   Codeine Other (See Comments)   Nausea and vomiting   Prednisone  Other (See Comments)   Makes her violent , patient reports only in high doses   Sulfa Antibiotics Hives        Medication List     STOP taking these medications    diclofenac  75 MG EC tablet Commonly known as: VOLTAREN    ketoconazole  2 % cream Commonly known as: NIZORAL    Skyrizi Pen 150 MG/ML pen Generic drug: risankizumab-rzaa   triamcinolone  cream 0.1 % Commonly known as: KENALOG        TAKE these medications    acetaminophen  500 MG tablet Commonly known as: TYLENOL  Take 1,000 mg by mouth  every 6 (six) hours as needed.   clotrimazole -betamethasone  cream Commonly known as: LOTRISONE  Apply 1 Application topically daily as needed.   ertapenem IVPB Commonly known as:  INVANZ Inject 1 g into the vein daily for 6 days. Indication:  ESBL K. Pneumoniae UTI First Dose: Yes Last Day of Therapy:  07/16/2024 Labs - Once weekly:  CBC/D and CMP Fax weekly lab results  promptly to [phone] Method of administration: Mini-Bag Plus / Gravity Method of administration may be changed at the discretion of home infusion pharmacist based upon assessment of the patient and/or caregiver's ability to self-administer the medication ordered. Please pull PICC/midline at completion of IV antibiotics Call [phone] with questions or critical value Start taking on: July 10, 2024   estradiol  1 MG tablet Commonly known as: Estrace  Take 1 tablet (1 mg total) by mouth daily.   ezetimibe  10 MG tablet Commonly known as: ZETIA  Take 1 tablet (10 mg total) by mouth daily.   fluconazole  150 MG tablet Commonly known as: DIFLUCAN  Take 1 tablet (150 mg total) by mouth every 3 (three) days as needed (for vaginal itching/yeast infection sx).   gabapentin  100 MG capsule Commonly known as: NEURONTIN  Take 1 capsule (100 mg total) by mouth 2 (two) times daily. If no improvement after 5 days increase to 3 times daily   hydrochlorothiazide  12.5 MG tablet Commonly known as: HYDRODIURIL  Take 1 tablet (12.5 mg total) by mouth daily.   lamoTRIgine  100 MG tablet Commonly known as: LaMICtal  Take 1 tablet (100 mg total) by mouth daily.   losartan  50 MG tablet Commonly known as: COZAAR  Take 1 tablet (50 mg total) by mouth daily.   metFORMIN  500 MG 24 hr tablet Commonly known as: GLUCOPHAGE -XR Take 1 tablet (500 mg total) by mouth 2 (two) times daily.   ondansetron  4 MG disintegrating tablet Commonly known as: ZOFRAN -ODT Take 1 tablet (4 mg total) by mouth every 8 (eight) hours as needed for nausea or vomiting.   OneTouch UltraSoft 2 Lancets Misc Use as directed.   OneTouch Verio Flex System w/Device Kit Use as directed   OneTouch Verio test strip Generic drug: glucose  blood Use as directed.   Ozempic  (1 MG/DOSE) 4 MG/3ML Sopn Generic drug: Semaglutide  (1 MG/DOSE) Inject 1 mg into the skin once a week.   pantoprazole  40 MG tablet Commonly known as: Protonix  Take 1 tablet (40 mg total) by mouth daily.   QUEtiapine  25 MG tablet Commonly known as: SEROquel  Take 1 tablet (25 mg total) by mouth at bedtime.   rosuvastatin  5 MG tablet Commonly known as: Crestor  Take 1 tablet (5 mg total) by mouth daily.   sertraline  100 MG tablet Commonly known as: Zoloft  Take 2 tablets (200 mg total) by mouth daily with breakfast.   silver  sulfADIAZINE  1 % cream Commonly known as: SILVADENE  Apply 1 Application topically daily.   traZODone  100 MG tablet Commonly known as: DESYREL  Take 1 tablet (100 mg total) by mouth at bedtime as needed.   verapamil  240 MG CR tablet Commonly known as: CALAN -SR Take 1 tablet (240 mg total) by mouth daily.               Discharge Care Instructions  (From admission, onward)           Start     Ordered   07/09/24 0000  Change dressing on IV access line weekly and PRN  (Home infusion instructions - [facility] )        07/09/24 1042  Follow-up Information     Leavy Mole, PA-C. Go on 07/12/2024.   Specialty: Family Medicine Why: @ 2pm Contact information: [address] [place] [address] [place] [address] [phone]                Discharge Exam: Fredricka Weights   07/07/24 1817 07/08/24 0053  Weight: 83.9 kg 84.2 kg   Vitals and nursing note reviewed.  Constitutional:      General: She is not in acute distress. HENT:     Head: Normocephalic and atraumatic.  Cardiovascular:     Rate and Rhythm: Normal rate and regular rhythm.     Heart sounds: Normal heart sounds.  Pulmonary:     Effort: Pulmonary effort is normal.     Breath sounds: Normal breath sounds.  Abdominal:     Palpations: Abdomen is soft.     Tenderness: There is no abdominal tenderness.   Neurological:     Mental Status: Mental status is at baseline.   Condition at discharge: good  The results of significant diagnostics from this hospitalization (including imaging, microbiology, ancillary and laboratory) are listed below for reference.   Imaging Studies: US  EKG SITE RITE Result Date: 07/09/2024 If Site Rite image not attached, placement could not be confirmed due to current cardiac rhythm.  DG Abd 1 View Result Date: 06/27/2024 CLINICAL DATA:  Renal calculus, recurrent urinary tract infection EXAM: ABDOMEN - 1 VIEW COMPARISON:  03/22/2024 FINDINGS: Two supine frontal views of the abdomen and pelvis are obtained. No bowel obstruction or ileus. Stable postsurgical changes from bariatric surgery. No significant fecal retention. No masses or abnormal calcifications. Lung bases are clear. IMPRESSION: 1. Unremarkable bowel gas pattern. 2. No radiopaque urinary tract calculi. Electronically Signed   By: Ozell Daring M.D.   On: 06/27/2024 23:06   CT CORONARY MORPH W/CTA COR W/SCORE W/CA W/CM &/OR WO/CM Addendum Date: 06/20/2024 ADDENDUM REPORT: 06/20/2024 16:18 EXAM: OVER-READ INTERPRETATION  CT CHEST The following report is an over-read performed by radiologist Dr. Ree Levy [facility] on 06/20/2024. This over-read does not include interpretation of cardiac or coronary anatomy or pathology. The coronary CTA interpretation by the cardiologist is attached. COMPARISON:  None. FINDINGS: Mediastinum/Nodes: No solid / cystic mediastinal masses. The esophagus is nondistended precluding optimal assessment. There is mild circumferential thickening of the lower thoracic esophagus, which is most likely seen in the settings of chronic gastroesophageal reflux disease versus esophagitis. There is also small sliding hiatal hernia. No thoracic lymphadenopathy by size criteria. Lungs/Pleura: Imaged tracheo-bronchial tree is patent. There are dependent changes and patchy areas of  linear, plate-like atelectasis and/or scarring throughout bilateral lungs. No mass or consolidation. No pleural effusion or pneumothorax. No suspicious lung nodules. Upper Abdomen: There is cirrhotic liver configuration. Remaining visualized upper abdominal viscera within normal limits. Musculoskeletal: The visualized soft tissues of the chest wall are grossly unremarkable. No suspicious osseous lesions. IMPRESSION: 1. No acute or significant extracardiac findings. 2. Cirrhotic liver configuration. Electronically Signed   By: Ree Molt M.D.   On: 06/20/2024 16:18   Result Date: 06/20/2024 CLINICAL DATA:  Dyspnea on exertion EXAM: Cardiac/Coronary  CTA TECHNIQUE: The patient was scanned on a Siemens Somatom scanner. : A prospective scan was triggered in the ascending thoracic aorta. Axial non-contrast 3 mm slices were carried out through the heart. The data set was analyzed on a dedicated work station and scored using the Agatson method. Gantry rotation speed was 66 msecs and collimation was .6 mm. 100mg  of metoprolol  and  0.8 mg of sl NTG was given. The 3D data set was reconstructed in 5% intervals of the 35-75 % of the R-R cycle. Diastolic phases were analyzed on a dedicated work station using MPR, MIP, and VRT modes. The patient received 100 cc of contrast. FINDINGS: Image quality: Average. Noise artifact is: Limited. Other artifacts: Significant misregistration artifact. Coronary Arteries:  Normal coronary origin.  Right dominance. Left main: The left main is a large caliber vessel with a normal take off from the left coronary cusp that bifurcates to form a left anterior descending artery and a left circumflex artery. There is minimal stenosis (<25%) due to non-calcified plaque in the distal segment at the LAD/LCx bifurcation. Left anterior descending artery: The LAD gives off 2 patent diagonal branches. There is mild stenosis (25-49%) in the proximal segment due to calcified plaque. Unable to accurately  evaluate distal segments due to misregistration artifact and small size. Left circumflex artery: The LCX is non-dominant and patent with no evidence of plaque or stenosis. The LCX gives off 2 patent obtuse marginal branches. There is minimal stenosis (<25%) of the ostial OM1. Right coronary artery: The RCA is dominant with normal take off from the right coronary cusp. There is mild stenosis (25-49%) of the proximal segment due to mixed calcified and non-calcified plaque. There is moderate stenosis (50-69%%) of the mid-segment due to mixed calcified and non-calcified plaque. There is moderate stenosis (50-69%) of the distal segment due to mixed calcified and non-calcified plaque which is best seen at 35% of the R-R interval. The RCA terminates as a PDA and right posterolateral branch. There is moderate stenosis (50-69%) of the proximal PDA due to mixed calcified and non-calcified plaque. Right Atrium: Right atrial size is within normal limits. Right Ventricle: The right ventricular cavity is within normal limits. Left Atrium: Left atrial size is normal in size with no left atrial appendage filling defect. Left Ventricle: The ventricular cavity size is within normal limits. Pulmonary arteries: Normal in size. Pulmonary veins: Normal pulmonary venous drainage. Pericardium: Normal thickness without significant effusion or calcium  present. Cardiac valves: The aortic valve is trileaflet. Mild calcifications of the non-coronary cusp. The mitral valve is normal without significant calcification. Aorta: Normal caliber. Moderate calcifications of the non-coronary, right coronary, and left coronary STJ. Mild calcifications of the descending aorta. Extra-cardiac findings: See attached radiology report for non-cardiac structures. IMPRESSION: 1. Coronary calcium  score of 817. This was 95th percentile for age-, sex, and race-matched controls. 2. Normal coronary origin with right dominance. 3. Moderate multi-focal stenoses of the  mid RCA, distal RCA, and proximal PDA. 4. Mild stenosis of the proximal LAD. 5. Extensive plaque burden. 6. Aortic atherosclerosis. RECOMMENDATIONS: CAD-RADS 3: Moderate stenosis. Consider symptom-guided anti-ischemic pharmacotherapy as well as risk factor modification per guideline directed care. Additional analysis with CT FFR will be submitted. Caron Poser, MD Electronically Signed: By: Caron Poser On: 06/14/2024 18:02   CT CORONARY FRACTIONAL FLOW RESERVE FLUID ANALYSIS Result Date: 06/14/2024 EXAM: CT FFR analysis was performed on the original cardiac CTA dataset. Diagrammatic representation of the CT FFR analysis is provided in a separate PDF document in PACS. This dictation was created using the PDF document and an interactive 3D model of the results. The 3D model is not available in the EMR/PACS. INTERPRETATION: CT FFR provides simultaneous calculation of pressure and flow across the entire coronary tree. For clinical decision making, CT FFR values should be obtained 1-2 cm distal to the lower border of each stenosis measured. Coronary CTA-related  artifacts may impair the diagnostic accuracy of the original cardiac CTA and FFR CT results. *Due to the fact that CT FFR represents a mathematically-derived analysis, it is recommended that the results be interpreted as follows: 1. CT FFR >0.80: Low likelihood of hemodynamic significance. 2. CT FFR 0.76-0.80: Borderline likelihood of hemodynamic significance. 3. CT FFR =< 0.75: High likelihood of hemodynamic significance. *Coronary CT Angiography-derived Fractional Flow Reserve Testing in Patients with Stable Coronary Artery Disease: Recommendations on Interpretation and Reporting. Radiology: Cardiothoracic Imaging. 2019;1(5):e190050 FINDINGS: 1. RCA: CT-FFR of 0.94, 0.91, and 0.80 of the mid-RCA, distal-RCA, and PDA stenoses, respectively. IMPRESSION: 1.  CT FFR analysis is not suggestive of any significant stenosis. Caron Poser, MD Electronically  Signed   By: Caron Poser   On: 06/14/2024 18:04    Microbiology: Results for orders placed or performed in visit on 06/22/24  Microscopic Examination     Status: Abnormal   Collection Time: 06/22/24  1:23 PM   Urine  Result Value Ref Range Status   WBC, UA >30 (A) 0 - 5 /hpf Final   RBC, Urine 0-2 0 - 2 /hpf Final   Epithelial Cells (non renal) >10 (A) 0 - 10 /hpf Final   Bacteria, UA Many (A) None seen/Few Final  CULTURE, URINE COMPREHENSIVE     Status: Abnormal   Collection Time: 06/22/24  4:35 PM   Specimen: Urine   UR  Result Value Ref Range Status   Urine Culture, Comprehensive Final report (A)  Final   Organism ID, Bacteria Klebsiella pneumoniae (A)  Final    Comment: Multi-Drug Resistant Organism 50,000-100,000 colony forming units per mL    Organism ID, Bacteria Not applicable  Final   ANTIMICROBIAL SUSCEPTIBILITY Comment  Final    Comment:       ** S = Susceptible; I = Intermediate; R = Resistant **                    P = Positive; N = Negative             MICS are expressed in micrograms per mL    Antibiotic                 RSLT#1    RSLT#2    RSLT#3    RSLT#4 Amoxicillin /Clavulanic Acid    S Ampicillin                     R Cefazolin                       R Cefepime                       R Cefoxitin                      R Cefpodoxime                    R Ceftriaxone                     R Ciprofloxacin                   R Ertapenem                      S Gentamicin                     S  Levofloxacin                   R Meropenem                      S Nitrofurantoin                  R Piperacillin/Tazobactam        I Tetracycline                   R Tobramycin                     S Trimethoprim/Sulfa             R     Labs: CBC: Recent Labs  Lab 07/07/24 1903 07/09/24 0441  WBC 8.1 5.3  NEUTROABS  --  2.9  HGB 13.5 13.1  HCT 41.5 39.9  MCV 84.0 84.7  PLT 173 155   Basic Metabolic Panel: Recent Labs  Lab 07/07/24 1818 07/09/24 0441  NA 136  139  K 4.4 3.9  CL 102 104  CO2 23 28  GLUCOSE 127* 95  BUN 18 16  CREATININE 0.91 1.05*  CALCIUM  9.3 9.1   Liver Function Tests: No results for input(s): AST, ALT, ALKPHOS, BILITOT, PROT, ALBUMIN in the last 168 hours. CBG: Recent Labs  Lab 07/08/24 0802 07/08/24 1307 07/08/24 1648 07/08/24 2132 07/09/24 0748  GLUCAP 91 145* 145* 109* 93    Discharge time spent:  34 minutes.  Signed: Drue ONEIDA Potter, MD [facility] 07/09/2024

## 2024-07-12 ENCOUNTER — Other Ambulatory Visit: Payer: Self-pay

## 2024-07-12 ENCOUNTER — Other Ambulatory Visit: Payer: Self-pay | Admitting: *Deleted

## 2024-07-12 ENCOUNTER — Inpatient Hospital Stay: Admitting: Family Medicine

## 2024-07-12 DIAGNOSIS — D509 Iron deficiency anemia, unspecified: Secondary | ICD-10-CM

## 2024-07-13 ENCOUNTER — Inpatient Hospital Stay: Attending: Oncology

## 2024-07-13 DIAGNOSIS — Z800 Family history of malignant neoplasm of digestive organs: Secondary | ICD-10-CM | POA: Diagnosis not present

## 2024-07-13 DIAGNOSIS — D509 Iron deficiency anemia, unspecified: Secondary | ICD-10-CM | POA: Insufficient documentation

## 2024-07-13 DIAGNOSIS — Z801 Family history of malignant neoplasm of trachea, bronchus and lung: Secondary | ICD-10-CM | POA: Diagnosis not present

## 2024-07-13 DIAGNOSIS — Z803 Family history of malignant neoplasm of breast: Secondary | ICD-10-CM | POA: Insufficient documentation

## 2024-07-13 LAB — FERRITIN: Ferritin: 67 ng/mL (ref 11–307)

## 2024-07-13 LAB — CBC WITH DIFFERENTIAL (CANCER CENTER ONLY)
Abs Immature Granulocytes: 0.02 K/uL (ref 0.00–0.07)
Basophils Absolute: 0 K/uL (ref 0.0–0.1)
Basophils Relative: 1 %
Eosinophils Absolute: 0.2 K/uL (ref 0.0–0.5)
Eosinophils Relative: 3 %
HCT: 41 % (ref 36.0–46.0)
Hemoglobin: 13.6 g/dL (ref 12.0–15.0)
Immature Granulocytes: 0 %
Lymphocytes Relative: 25 %
Lymphs Abs: 1.5 K/uL (ref 0.7–4.0)
MCH: 27.7 pg (ref 26.0–34.0)
MCHC: 33.2 g/dL (ref 30.0–36.0)
MCV: 83.5 fL (ref 80.0–100.0)
Monocytes Absolute: 0.6 K/uL (ref 0.1–1.0)
Monocytes Relative: 9 %
Neutro Abs: 3.7 K/uL (ref 1.7–7.7)
Neutrophils Relative %: 62 %
Platelet Count: 171 K/uL (ref 150–400)
RBC: 4.91 MIL/uL (ref 3.87–5.11)
RDW: 13.7 % (ref 11.5–15.5)
WBC Count: 6 K/uL (ref 4.0–10.5)
nRBC: 0 % (ref 0.0–0.2)

## 2024-07-13 LAB — IRON AND TIBC
Iron: 86 ug/dL (ref 28–170)
Saturation Ratios: 23 % (ref 10.4–31.8)
TIBC: 372 ug/dL (ref 250–450)
UIBC: 286 ug/dL

## 2024-07-13 NOTE — Telephone Encounter (Unsigned)
 Copied from CRM [id number]. Topic: General - Other >> Jul 13, 2024 10:32 AM Myrick T wrote: Reason for CRM: Bascom [id number] from [facility] called with an FYI: patient is getting iv antibiotics by another provider. She also has a history of congestive heart failure but denies the diagnosis so she chooses not the weigh daily. If there is addtnl info needed please reach out to Tonya

## 2024-07-14 ENCOUNTER — Inpatient Hospital Stay

## 2024-07-14 ENCOUNTER — Encounter: Payer: Self-pay | Admitting: Oncology

## 2024-07-14 ENCOUNTER — Inpatient Hospital Stay: Admitting: Oncology

## 2024-07-14 VITALS — BP 139/89 | HR 79 | Temp 96.9°F | Resp 18 | Ht 60.0 in | Wt 187.0 lb

## 2024-07-14 DIAGNOSIS — D509 Iron deficiency anemia, unspecified: Secondary | ICD-10-CM

## 2024-07-14 NOTE — Progress Notes (Unsigned)
  [facility]  [phone] [phone]  ID: April Singleton OB: 1950-12-17  MR#: [id number]  RDW#:[id number]  Patient Care Team: Leavy Mole, PA-C as PCP - General (Family Medicine) Darliss Rogue, MD as PCP - Cardiology (Cardiology) Vivienne Lonni Ingle, NP as Nurse Practitioner (Cardiology)  CHIEF COMPLAINT: Iron  deficiency anemia.  INTERVAL HISTORY: Patient returns to clinic today for repeat laboratory, further evaluation, and consideration of additional Venofer .  She currently feels well and is asymptomatic.  She does not complain of any weakness or fatigue today. She has no neurologic complaints.  She denies any recent fevers or illnesses.  She has a good appetite and denies weight loss.  She has no chest pain, shortness of breath, cough, or hemoptysis.  She denies any nausea, vomiting, constipation, or diarrhea.  She has no melena or hematochezia.  She has no urinary complaints.  Patient offers no further specific complaints today.  REVIEW OF SYSTEMS:   Review of Systems  Constitutional: Negative.  Negative for fever, malaise/fatigue and weight loss.  Respiratory: Negative.  Negative for cough, hemoptysis and shortness of breath.   Cardiovascular: Negative.  Negative for chest pain and leg swelling.  Gastrointestinal: Negative.  Negative for abdominal pain, blood in stool and melena.  Genitourinary: Negative.  Negative for hematuria.  Musculoskeletal: Negative.  Negative for back pain.  Skin: Negative.  Negative for rash.  Neurological: Negative.  Negative for dizziness, focal weakness, weakness and headaches.  Psychiatric/Behavioral: Negative.  The patient is not nervous/anxious.     As per HPI. Otherwise, a complete review of systems is negative.  PAST MEDICAL HISTORY: Past Medical History:  Diagnosis Date   Anemia    Arthritis    Asthma    CAD (coronary artery disease)    a. 05/2024 Cor CTA: Ca2+ = 817 (95th%'ile). LM <25, LAD  25-49p, LCX non-dominant, OM1 <25, RCA dominant 25-49p, 50-63m, 50-69d, RPDA 50-69.  CT FFR of RCA 0.94/0.91/0.80 ->non-obs dzs.   Depression    Diabetes mellitus without complication (HCC)    Diastolic dysfunction    a. 04/2024 Echo: EF 60-65%, GrI DD, nl RV size/fxn, AoV sclerosis w/o stenosis.   GERD (gastroesophageal reflux disease)    Hypertension    Nephrolithiasis    a. 02/2024 CT Abd: 4mm R lower pole renal stone.   Pneumonia    PONV (postoperative nausea and vomiting)    Superior mesenteric artery stenosis    a. 02/2024 CT Abd: sev SMA stenosis.    PAST SURGICAL HISTORY: Past Surgical History:  Procedure Laterality Date   ABDOMINAL HYSTERECTOMY     BREAST BIOPSY     Pt unsure of type of biopsy or even of which side   KNEE ARTHROPLASTY Left 04/29/2022   Procedure: COMPUTER ASSISTED TOTAL KNEE ARTHROPLASTY;  Surgeon: Mardee Lynwood SQUIBB, MD;  Location: [facility] ORS;  Service: Orthopedics;  Laterality: Left;   REDUCTION MAMMAPLASTY     TONSILLECTOMY      FAMILY HISTORY: Family History  Problem Relation Age of Onset   Heart disease Mother    Lung cancer Mother    Hypertension Mother    Congestive Heart Failure Mother    Hypertension Father    Liver cancer Father    Lung cancer Father    Hypertension Sister    Hypertension Brother    Breast cancer Maternal Aunt    Breast cancer Maternal Aunt    Hypertension Maternal Uncle    Hypertension Paternal Aunt    Heart failure Maternal Grandmother  Fainting Maternal Grandfather    Heart disease Maternal Grandfather     ADVANCED DIRECTIVES (Y/N):  N  HEALTH MAINTENANCE: Social History   Tobacco Use   Smoking status: Never   Smokeless tobacco: Never  Vaping Use   Vaping status: Never Used  Substance Use Topics   Alcohol use: Not Currently    Comment: rarely   Drug use: Never     Colonoscopy:  PAP:  Bone density:  Lipid panel:  Allergies  Allergen Reactions   Codeine Other (See Comments)    Nausea and vomiting    Prednisone  Other (See Comments)    Makes her violent , patient reports only in high doses   Sulfa Antibiotics Hives    Current Outpatient Medications  Medication Sig Dispense Refill   acetaminophen  (TYLENOL ) 500 MG tablet Take 1,000 mg by mouth every 6 (six) hours as needed.     Blood Glucose Monitoring Suppl (BLOOD GLUCOSE MONITOR SYSTEM) w/Device KIT Use as directed 1 kit 0   clotrimazole -betamethasone  (LOTRISONE ) cream Apply 1 Application topically daily as needed. 45 g 3   ertapenem (INVANZ) 1 g injection      ertapenem (INVANZ) IVPB Inject 1 g into the vein daily for 6 days. Indication:  ESBL K. Pneumoniae UTI First Dose: Yes Last Day of Therapy:  07/16/2024 Labs - Once weekly:  CBC/D and CMP Fax weekly lab results  promptly to [phone] Method of administration: Mini-Bag Plus / Gravity Method of administration may be changed at the discretion of home infusion pharmacist based upon assessment of the patient and/or caregiver's ability to self-administer the medication ordered. Please pull PICC/midline at completion of IV antibiotics Call [phone] with questions or critical value 7 Units 0   estradiol  (ESTRACE ) 1 MG tablet Take 1 tablet (1 mg total) by mouth daily. 90 tablet 1   ezetimibe  (ZETIA ) 10 MG tablet Take 1 tablet (10 mg total) by mouth daily. 90 tablet 3   fluconazole  (DIFLUCAN ) 150 MG tablet Take 1 tablet (150 mg total) by mouth every 3 (three) days as needed (for vaginal itching/yeast infection sx). 4 tablet 0   gabapentin  (NEURONTIN ) 100 MG capsule Take 1 capsule (100 mg total) by mouth 2 (two) times daily. If no improvement after 5 days increase to 3 times daily 60 capsule 11   glucose blood test strip Use as directed. 100 each 0   hydrochlorothiazide  (HYDRODIURIL ) 12.5 MG tablet Take 1 tablet (12.5 mg total) by mouth daily. 90 tablet 1   lamoTRIgine  (LAMICTAL ) 100 MG tablet Take 1 tablet (100 mg total) by mouth daily. 90 tablet 0   losartan  (COZAAR ) 50 MG  tablet Take 1 tablet (50 mg total) by mouth daily. 90 tablet 1   metFORMIN  (GLUCOPHAGE -XR) 500 MG 24 hr tablet Take 1 tablet (500 mg total) by mouth 2 (two) times daily. 180 tablet 1   ondansetron  (ZOFRAN -ODT) 4 MG disintegrating tablet Take 1 tablet (4 mg total) by mouth every 8 (eight) hours as needed for nausea or vomiting. 20 tablet 0   OneTouch UltraSoft 2 Lancets MISC Use as directed. 100 each 0   pantoprazole  (PROTONIX ) 40 MG tablet Take 1 tablet (40 mg total) by mouth daily. 90 tablet 3   QUEtiapine  (SEROQUEL ) 25 MG tablet Take 1 tablet (25 mg total) by mouth at bedtime. 90 tablet 0   rosuvastatin  (CRESTOR ) 5 MG tablet Take 1 tablet (5 mg total) by mouth daily. 90 tablet 3   Semaglutide , 1 MG/DOSE, (OZEMPIC , 1 MG/DOSE,) 4 MG/3ML SOPN  Inject 1 mg into the skin once a week. 9 mL 1   sertraline  (ZOLOFT ) 100 MG tablet Take 2 tablets (200 mg total) by mouth daily with breakfast. 180 tablet 0   silver  sulfADIAZINE  (SILVADENE ) 1 % cream Apply 1 Application topically daily. 90 g 1   traZODone  (DESYREL ) 100 MG tablet Take 1 tablet (100 mg total) by mouth at bedtime as needed. 90 tablet 0   verapamil  (CALAN -SR) 240 MG CR tablet Take 1 tablet (240 mg total) by mouth daily. 90 tablet 1   No current facility-administered medications for this visit.    OBJECTIVE: Vitals:   07/14/24 1016  BP: 139/89  Pulse: 79  Resp: 18  Temp: (!) 96.9 F (36.1 C)  SpO2: 99%     Body mass index is 36.52 kg/m.    ECOG FS:0 - Asymptomatic  General: Well-developed, well-nourished, no acute distress. Eyes: Pink conjunctiva, anicteric sclera. HEENT: Normocephalic, moist mucous membranes. Lungs: No audible wheezing or coughing. Heart: Regular rate and rhythm. Abdomen: Soft, nontender, no obvious distention. Musculoskeletal: No edema, cyanosis, or clubbing. Neuro: Alert, answering all questions appropriately. Cranial nerves grossly intact. Skin: No rashes or petechiae noted. Psych: Normal affect.  LAB  RESULTS:  Lab Results  Component Value Date   NA 139 07/09/2024   K 3.9 07/09/2024   CL 104 07/09/2024   CO2 28 07/09/2024   GLUCOSE 95 07/09/2024   BUN 16 07/09/2024   CREATININE 1.05 (H) 07/09/2024   CALCIUM  9.1 07/09/2024   PROT 7.8 03/22/2024   ALBUMIN 3.8 03/22/2024   AST 25 03/22/2024   ALT 19 03/22/2024   ALKPHOS 55 03/22/2024   BILITOT 0.6 03/22/2024   GFRNONAA 56 (L) 07/09/2024   GFRAA 100 03/27/2020    Lab Results  Component Value Date   WBC 6.0 07/13/2024   NEUTROABS 3.7 07/13/2024   HGB 13.6 07/13/2024   HCT 41.0 07/13/2024   MCV 83.5 07/13/2024   PLT 171 07/13/2024   Lab Results  Component Value Date   IRON  86 07/13/2024   TIBC 372 07/13/2024   IRONPCTSAT 23 07/13/2024   Lab Results  Component Value Date   FERRITIN 67 07/13/2024     STUDIES: US  EKG SITE RITE Result Date: 07/09/2024 If Site Rite image not attached, placement could not be confirmed due to current cardiac rhythm.  DG Abd 1 View Result Date: 06/27/2024 CLINICAL DATA:  Renal calculus, recurrent urinary tract infection EXAM: ABDOMEN - 1 VIEW COMPARISON:  03/22/2024 FINDINGS: Two supine frontal views of the abdomen and pelvis are obtained. No bowel obstruction or ileus. Stable postsurgical changes from bariatric surgery. No significant fecal retention. No masses or abnormal calcifications. Lung bases are clear. IMPRESSION: 1. Unremarkable bowel gas pattern. 2. No radiopaque urinary tract calculi. Electronically Signed   By: Ozell Daring M.D.   On: 06/27/2024 23:06    ASSESSMENT: Iron  deficiency anemia.  PLAN:    Iron  deficiency anemia: Patient's hemoglobin and iron  stores are now within normal limits.  She reports her primary care physician recently gave her stool cards, but we do not have these results.  She states she has not had a colonoscopy in 6 or 7 years and acknowledges that she likely will need one in the near future.  She does not require additional IV Venofer  today.  Patient  last received treatment on March 31, 2024.  No intervention is needed.  Return to clinic in 4 months with repeat laboratory work, further evaluation, and continuation of treatment if needed.  I spent a total of 20 minutes reviewing chart data, face-to-face evaluation with the patient, counseling and coordination of care as detailed above.   Patient expressed understanding and was in agreement with this plan. She also understands that She can call clinic at any time with any questions, concerns, or complaints.    Evalene JINNY Reusing, MD   07/15/2024 7:25 AM

## 2024-07-14 NOTE — Progress Notes (Unsigned)
 Patient has had a UTI the infectious disease provider started her on a IV antibiotic last week. She is having severe diarrhea since being on the IV antibiotic, which is making her not want to eat.

## 2024-07-15 ENCOUNTER — Encounter: Payer: Self-pay | Admitting: Oncology

## 2024-07-16 ENCOUNTER — Ambulatory Visit: Admitting: Nurse Practitioner

## 2024-07-19 ENCOUNTER — Telehealth: Payer: Self-pay

## 2024-07-19 NOTE — Telephone Encounter (Signed)
 FYI

## 2024-07-19 NOTE — Telephone Encounter (Signed)
 Copied from CRM [id number]. Topic: Appointments - Appointment Scheduling >> Jul 19, 2024  1:31 PM Delon DASEN wrote: Patient scheduled for hospital follow up, only wants to see Leisa.

## 2024-07-23 ENCOUNTER — Ambulatory Visit (INDEPENDENT_AMBULATORY_CARE_PROVIDER_SITE_OTHER): Admitting: Urology

## 2024-07-23 VITALS — BP 125/78 | HR 80 | Ht 60.0 in | Wt 185.0 lb

## 2024-07-23 DIAGNOSIS — N390 Urinary tract infection, site not specified: Secondary | ICD-10-CM

## 2024-07-23 LAB — URINALYSIS, COMPLETE
Bilirubin, UA: NEGATIVE
Glucose, UA: NEGATIVE
Ketones, UA: NEGATIVE
Nitrite, UA: NEGATIVE
Protein,UA: NEGATIVE
RBC, UA: NEGATIVE
Specific Gravity, UA: 1.015 (ref 1.005–1.030)
Urobilinogen, Ur: 0.2 mg/dL (ref 0.2–1.0)
pH, UA: 6 (ref 5.0–7.5)

## 2024-07-23 LAB — MICROSCOPIC EXAMINATION

## 2024-07-23 MED ORDER — GEMTESA 75 MG PO TABS: 1.0000 | ORAL_TABLET | Freq: Every day | ORAL | Status: DC

## 2024-07-23 NOTE — Progress Notes (Signed)
 07/23/2024 8:08 AM   April Singleton [birth year] [id number]  Referring provider: Leavy Mole, PA-C [address] [place],  [place] [address]  Chief Complaint  Patient presents with   Nephrolithiasis    HPI: April Singleton is a 73 y.o. female who presents for follow-up visit.  Refer to my prior office note 04/21/2024. She was started on low-dose antibiotic prophylaxis at that visit and was doing much better on the antibiotic however at a follow-up visit 06/22/2024 she was complaining of a 1 week history of frequency, urgency, dysuria, pelvic pressure and low back pain with subjective fever and chills.  Urinalysis was nitrite positive with 2+ leukocytes.  Urine culture subsequently grew 50-100 K MDR Klebsiella which was sensitive to Augmentin  which was started.  1 week later she was complaining of worsening symptoms and had requested an antibiotic change however there were no effective p.o. alternatives secondary to multidrug-resistant bacteria She was admitted to [facility] 07/08/2024 and started on IV antibiotic therapy.  She was seen by ID who felt she had chronic colonization.  Low-dose vaginal estrogen, cranberry and probiotics were recommended She was discharged 07/09/2024 with a PICC line and an additional 5 days of IV antibiotic therapy Her symptoms resolved after completing IV antibiotic therapy.  She presently has mild dysuria.   PMH: Past Medical History:  Diagnosis Date   Anemia    Arthritis    Asthma    CAD (coronary artery disease)    a. 05/2024 Cor CTA: Ca2+ = 817 (95th%'ile). LM <25, LAD 25-49p, LCX non-dominant, OM1 <25, RCA dominant 25-49p, 50-57m, 50-69d, RPDA 50-69.  CT FFR of RCA 0.94/0.91/0.80 ->non-obs dzs.   Depression    Diabetes mellitus without complication (HCC)    Diastolic dysfunction    a. 04/2024 Echo: EF 60-65%, GrI DD, nl RV size/fxn, AoV sclerosis w/o stenosis.   GERD (gastroesophageal reflux disease)    Hypertension    Nephrolithiasis    a.  02/2024 CT Abd: 4mm R lower pole renal stone.   Pneumonia    PONV (postoperative nausea and vomiting)    Superior mesenteric artery stenosis    a. 02/2024 CT Abd: sev SMA stenosis.    Surgical History: Past Surgical History:  Procedure Laterality Date   ABDOMINAL HYSTERECTOMY     BREAST BIOPSY     Pt unsure of type of biopsy or even of which side   KNEE ARTHROPLASTY Left 04/29/2022   Procedure: COMPUTER ASSISTED TOTAL KNEE ARTHROPLASTY;  Surgeon: Mardee Lynwood SQUIBB, MD;  Location: [facility] ORS;  Service: Orthopedics;  Laterality: Left;   REDUCTION MAMMAPLASTY     TONSILLECTOMY      Home Medications:  Allergies as of 07/23/2024       Reactions   Codeine Other (See Comments)   Nausea and vomiting   Prednisone  Other (See Comments)   Makes her violent , patient reports only in high doses   Sulfa Antibiotics Hives        Medication List        Accurate as of July 23, 2024  8:08 AM. If you have any questions, ask your nurse or doctor.          STOP taking these medications    ertapenem 1 g injection Commonly known as: INVANZ       TAKE these medications    acetaminophen  500 MG tablet Commonly known as: TYLENOL  Take 1,000 mg by mouth every 6 (six) hours as needed.   clotrimazole -betamethasone  cream Commonly known as:  LOTRISONE  Apply 1 Application topically daily as needed.   estradiol  1 MG tablet Commonly known as: Estrace  Take 1 tablet (1 mg total) by mouth daily.   ezetimibe  10 MG tablet Commonly known as: ZETIA  Take 1 tablet (10 mg total) by mouth daily.   fluconazole  150 MG tablet Commonly known as: DIFLUCAN  Take 1 tablet (150 mg total) by mouth every 3 (three) days as needed (for vaginal itching/yeast infection sx).   gabapentin  100 MG capsule Commonly known as: NEURONTIN  Take 1 capsule (100 mg total) by mouth 2 (two) times daily. If no improvement after 5 days increase to 3 times daily   Gemtesa 75 MG Tabs Generic drug: Vibegron Take 1 tablet (75  mg total) by mouth daily.   hydrochlorothiazide  12.5 MG tablet Commonly known as: HYDRODIURIL  Take 1 tablet (12.5 mg total) by mouth daily.   lamoTRIgine  100 MG tablet Commonly known as: LaMICtal  Take 1 tablet (100 mg total) by mouth daily.   losartan  50 MG tablet Commonly known as: COZAAR  Take 1 tablet (50 mg total) by mouth daily.   metFORMIN  500 MG 24 hr tablet Commonly known as: GLUCOPHAGE -XR Take 1 tablet (500 mg total) by mouth 2 (two) times daily.   ondansetron  4 MG disintegrating tablet Commonly known as: ZOFRAN -ODT Take 1 tablet (4 mg total) by mouth every 8 (eight) hours as needed for nausea or vomiting.   OneTouch UltraSoft 2 Lancets Misc Use as directed.   OneTouch Verio Flex System w/Device Kit Use as directed   OneTouch Verio test strip Generic drug: glucose blood Use as directed.   Ozempic  (1 MG/DOSE) 4 MG/3ML Sopn Generic drug: Semaglutide  (1 MG/DOSE) Inject 1 mg into the skin once a week.   pantoprazole  40 MG tablet Commonly known as: Protonix  Take 1 tablet (40 mg total) by mouth daily.   QUEtiapine  25 MG tablet Commonly known as: SEROquel  Take 1 tablet (25 mg total) by mouth at bedtime.   rosuvastatin  5 MG tablet Commonly known as: Crestor  Take 1 tablet (5 mg total) by mouth daily.   sertraline  100 MG tablet Commonly known as: Zoloft  Take 2 tablets (200 mg total) by mouth daily with breakfast.   silver  sulfADIAZINE  1 % cream Commonly known as: SILVADENE  Apply 1 Application topically daily.   traZODone  100 MG tablet Commonly known as: DESYREL  Take 1 tablet (100 mg total) by mouth at bedtime as needed.   verapamil  240 MG CR tablet Commonly known as: CALAN -SR Take 1 tablet (240 mg total) by mouth daily.        Allergies:  Allergies  Allergen Reactions   Codeine Other (See Comments)    Nausea and vomiting   Prednisone  Other (See Comments)    Makes her violent , patient reports only in high doses   Sulfa Antibiotics Hives     Family History: Family History  Problem Relation Age of Onset   Heart disease Mother    Lung cancer Mother    Hypertension Mother    Congestive Heart Failure Mother    Hypertension Father    Liver cancer Father    Lung cancer Father    Hypertension Sister    Hypertension Brother    Breast cancer Maternal Aunt    Breast cancer Maternal Aunt    Hypertension Maternal Uncle    Hypertension Paternal Aunt    Heart failure Maternal Grandmother    Fainting Maternal Grandfather    Heart disease Maternal Grandfather     Social History:  reports that she has  never smoked. She has never used smokeless tobacco. She reports that she does not currently use alcohol. She reports that she does not use drugs.   Physical Exam: BP 125/78   Pulse 80   Ht 5' (1.524 m)   Wt 185 lb (83.9 kg)   BMI 36.13 kg/m   Constitutional:  Alert, No acute distress. HEENT:  AT Respiratory: Normal respiratory effort, no increased work of breathing. Psychiatric: Normal mood and affect.   Assessment & Plan:    1. Recurrent UTI  Mild dysuria and urinalysis was ordered.  She will be contacted with results She does have baseline frequency, urgency and given samples of Gemtesa x 28 days Schedule cystoscopy to rule out any lower tract abnormalities.  CT remarkable for a small, nonobstructing 4 mm right lower pole calculus   Glendia JAYSON Barba, MD  [facility] [address], [address] [place], [place] [address] [phone]

## 2024-07-23 NOTE — Patient Instructions (Signed)

## 2024-07-24 ENCOUNTER — Encounter: Payer: Self-pay | Admitting: Urology

## 2024-07-24 ENCOUNTER — Ambulatory Visit: Payer: Self-pay | Admitting: Urology

## 2024-07-28 ENCOUNTER — Other Ambulatory Visit: Payer: Self-pay | Admitting: *Deleted

## 2024-07-28 ENCOUNTER — Other Ambulatory Visit

## 2024-07-28 DIAGNOSIS — N390 Urinary tract infection, site not specified: Secondary | ICD-10-CM

## 2024-07-28 LAB — MICROSCOPIC EXAMINATION: Epithelial Cells (non renal): >10 /HPF — AB (ref 0–10)

## 2024-07-28 LAB — URINALYSIS, COMPLETE
Bilirubin, UA: NEGATIVE
Glucose, UA: NEGATIVE
Ketones, UA: NEGATIVE
Nitrite, UA: NEGATIVE
Protein,UA: NEGATIVE
Specific Gravity, UA: 1.02 (ref 1.005–1.030)
Urobilinogen, Ur: 0.2 mg/dL (ref 0.2–1.0)
pH, UA: 6 (ref 5.0–7.5)

## 2024-07-28 NOTE — Telephone Encounter (Signed)
 Patient coming in today for urine sample and culture

## 2024-07-30 ENCOUNTER — Other Ambulatory Visit: Payer: Self-pay

## 2024-07-30 ENCOUNTER — Other Ambulatory Visit: Payer: Self-pay | Admitting: Family Medicine

## 2024-07-30 ENCOUNTER — Other Ambulatory Visit: Payer: Self-pay | Admitting: Urology

## 2024-07-30 DIAGNOSIS — I10 Essential (primary) hypertension: Secondary | ICD-10-CM

## 2024-07-30 DIAGNOSIS — Z5181 Encounter for therapeutic drug level monitoring: Secondary | ICD-10-CM

## 2024-07-30 DIAGNOSIS — E1165 Type 2 diabetes mellitus with hyperglycemia: Secondary | ICD-10-CM

## 2024-07-30 MED ORDER — AMOXICILLIN-POT CLAVULANATE 875-125 MG PO TABS: 1.0000 | ORAL_TABLET | Freq: Two times a day (BID) | ORAL | 0 refills | Status: AC

## 2024-07-30 NOTE — Progress Notes (Signed)
 Patient called nursing line after hours. Spoke with triage RN. Worsening UTI-type symptoms since clinic visit earlier this week. Culture pending. She has a history of highly resistant Klebsiella requiring PICC abx. Will send her Augmentin  which is the only oral option based on last culture, 7-day course. Follow up with us  on Monday to check in.

## 2024-07-31 MED ORDER — METFORMIN HCL ER 500 MG PO TB24
500.0000 mg | ORAL_TABLET | Freq: Two times a day (BID) | ORAL | 0 refills | Status: AC
  Filled 2024-07-31 – 2024-10-05 (×2): qty 180, 90d supply, fill #0

## 2024-07-31 MED ORDER — OZEMPIC (1 MG/DOSE) 4 MG/3ML ~~LOC~~ SOPN
1.0000 mg | PEN_INJECTOR | SUBCUTANEOUS | 0 refills | Status: DC
  Filled 2024-07-31 – 2024-08-03 (×2): qty 9, 84d supply, fill #0

## 2024-07-31 MED ORDER — VERAPAMIL HCL ER 240 MG PO TBCR
240.0000 mg | EXTENDED_RELEASE_TABLET | Freq: Every day | ORAL | 0 refills | Status: AC
  Filled 2024-07-31 – 2024-10-05 (×2): qty 90, 90d supply, fill #0

## 2024-07-31 MED FILL — Losartan Potassium Tab 50 MG: ORAL | 90 days supply | Qty: 90 | Fill #0 | Status: CN

## 2024-07-31 NOTE — Telephone Encounter (Signed)
 Requested Prescriptions  Pending Prescriptions Disp Refills   Semaglutide , 1 MG/DOSE, (OZEMPIC , 1 MG/DOSE,) 4 MG/3ML SOPN 9 mL 0    Sig: Inject 1 mg into the skin once a week.     Endocrinology:  Diabetes - GLP-1 Receptor Agonists - semaglutide  Failed - 07/31/2024  4:26 PM      Failed - HBA1C in normal range and within 180 days    Hemoglobin A1C  Date Value Ref Range Status  09/03/2012 5.7 4.2 - 6.3 % Final    Comment:    The American Diabetes Association recommends that a primary goal of therapy should be <7% and that physicians should reevaluate the treatment regimen in patients with HbA1c values consistently >8%.    HbA1c POC (<> result, manual entry)  Date Value Ref Range Status  10/26/2020 7.2 4.0 - 5.6 % Final   Hgb A1c MFr Bld  Date Value Ref Range Status  03/08/2024 6.1 (H) <5.7 % Final    Comment:    For someone without known diabetes, a hemoglobin  A1c value between 5.7% and 6.4% is consistent with prediabetes and should be confirmed with a  follow-up test. . For someone with known diabetes, a value <7% indicates that their diabetes is well controlled. A1c targets should be individualized based on duration of diabetes, age, comorbid conditions, and other considerations. . This assay result is consistent with an increased risk of diabetes. . Currently, no consensus exists regarding use of hemoglobin A1c for diagnosis of diabetes for children. .          Failed - Cr in normal range and within 360 days    Creat  Date Value Ref Range Status  03/08/2024 0.81 0.60 - 1.00 mg/dL Final   Creatinine, Ser  Date Value Ref Range Status  07/09/2024 1.05 (H) 0.44 - 1.00 mg/dL Final   Creatinine, Urine  Date Value Ref Range Status  03/08/2024 190 20 - 275 mg/dL Final         Passed - Valid encounter within last 6 months    Recent Outpatient Visits           3 months ago Dysuria   [facility] Leavy Mole, [facility]-C   3 months ago Dysuria    [facility] Leavy Mole, PA-C   4 months ago Encounter for medical examination to establish care   [facility] Leavy Mole, [place]       Future Appointments             In 2 weeks Twylla Glendia BROCKS, MD [facility] [place]   In 4 months Vivienne, Lonni Ingle, NP [facility]             metFORMIN  (GLUCOPHAGE -XR) 500 MG 24 hr tablet 180 tablet 0    Sig: Take 1 tablet (500 mg total) by mouth 2 (two) times daily.     Endocrinology:  Diabetes - Biguanides Failed - 07/31/2024  4:26 PM      Failed - Cr in normal range and within 360 days    Creat  Date Value Ref Range Status  03/08/2024 0.81 0.60 - 1.00 mg/dL Final   Creatinine, Ser  Date Value Ref Range Status  07/09/2024 1.05 (H) 0.44 - 1.00 mg/dL Final   Creatinine, Urine  Date Value Ref Range Status  03/08/2024 190 20 - 275 mg/dL Final         Failed - eGFR in normal  range and within 360 days    GFR, Est African American  Date Value Ref Range Status  03/27/2020 100 > OR = 60 mL/min/1.29m2 Final   GFR, Est Non African American  Date Value Ref Range Status  03/27/2020 87 > OR = 60 mL/min/1.54m2 Final   GFR, Estimated  Date Value Ref Range Status  07/09/2024 56 (L) >60 mL/min Final    Comment:    (NOTE) Calculated using the CKD-EPI Creatinine Equation (2021)    eGFR  Date Value Ref Range Status  05/17/2024 57 (L) >59 mL/min/1.73 Final         Failed - B12 Level in normal range and within 720 days    Vitamin B-12  Date Value Ref Range Status  05/22/2018 676 180 - 914 pg/mL Final    Comment:    (NOTE) This assay is not validated for testing neonatal or myeloproliferative syndrome specimens for Vitamin B12 levels. Performed at [facility], [address] [place], [place] [address]          Passed - HBA1C is between 0 and 7.9 and within 180 days    Hemoglobin A1C  Date Value Ref Range Status  09/03/2012  5.7 4.2 - 6.3 % Final    Comment:    The American Diabetes Association recommends that a primary goal of therapy should be <7% and that physicians should reevaluate the treatment regimen in patients with HbA1c values consistently >8%.    HbA1c POC (<> result, manual entry)  Date Value Ref Range Status  10/26/2020 7.2 4.0 - 5.6 % Final   Hgb A1c MFr Bld  Date Value Ref Range Status  03/08/2024 6.1 (H) <5.7 % Final    Comment:    For someone without known diabetes, a hemoglobin  A1c value between 5.7% and 6.4% is consistent with prediabetes and should be confirmed with a  follow-up test. . For someone with known diabetes, a value <7% indicates that their diabetes is well controlled. A1c targets should be individualized based on duration of diabetes, age, comorbid conditions, and other considerations. . This assay result is consistent with an increased risk of diabetes. . Currently, no consensus exists regarding use of hemoglobin A1c for diagnosis of diabetes for children. SABRA Amy - Valid encounter within last 6 months    Recent Outpatient Visits           3 months ago Dysuria   [facility] Leavy Mole, PA-C   3 months ago Dysuria   [facility] Leavy Mole, PA-C   4 months ago Encounter for medical examination to establish care   [facility] Leavy Mole, [place]       Future Appointments             In 2 weeks Twylla, Glendia BROCKS, MD [facility]   In 4 months Vivienne, Lonni Ingle, NP [facility] [place] [facility] - CBC within normal limits and completed in the last 12 months    WBC  Date Value Ref Range Status  07/09/2024 5.3 4.0 - 10.5 K/uL Final   WBC Count  Date Value Ref Range Status  07/13/2024 6.0 4.0 - 10.5 K/uL Final   RBC  Date Value Ref Range Status  07/13/2024 4.91 3.87 - 5.11 MIL/uL Final   Hemoglobin   Date  Value Ref Range Status  07/13/2024 13.6 12.0 - 15.0 g/dL Final   HGB  Date Value Ref Range Status  01/05/2014 13.6 12.0 - 16.0 g/dL Final   HCT  Date Value Ref Range Status  07/13/2024 41.0 36.0 - 46.0 % Final  01/05/2014 40.6 35.0 - 47.0 % Final   MCHC  Date Value Ref Range Status  07/13/2024 33.2 30.0 - 36.0 g/dL Final   [facility]  Date Value Ref Range Status  07/13/2024 27.7 26.0 - 34.0 pg Final   MCV  Date Value Ref Range Status  07/13/2024 83.5 80.0 - 100.0 fL Final  01/05/2014 87 80 - 100 fL Final   No results found for: PLTCOUNTKUC, LABPLAT, POCPLA RDW  Date Value Ref Range Status  07/13/2024 13.7 11.5 - 15.5 % Final  01/05/2014 12.8 11.5 - 14.5 % Final          verapamil  (CALAN -SR) 240 MG CR tablet 90 tablet 0    Sig: Take 1 tablet (240 mg total) by mouth daily.     Cardiovascular: Calcium  Channel Blockers 3 Failed - 07/31/2024  4:26 PM      Failed - Cr in normal range and within 360 days    Creat  Date Value Ref Range Status  03/08/2024 0.81 0.60 - 1.00 mg/dL Final   Creatinine, Ser  Date Value Ref Range Status  07/09/2024 1.05 (H) 0.44 - 1.00 mg/dL Final   Creatinine, Urine  Date Value Ref Range Status  03/08/2024 190 20 - 275 mg/dL Final         Passed - ALT in normal range and within 360 days    ALT  Date Value Ref Range Status  03/22/2024 19 0 - 44 U/L Final   SGPT (ALT)  Date Value Ref Range Status  01/05/2014 31 12 - 78 U/L Final         Passed - AST in normal range and within 360 days    AST  Date Value Ref Range Status  03/22/2024 25 15 - 41 U/L Final   SGOT(AST)  Date Value Ref Range Status  01/05/2014 29 15 - 37 Unit/L Final         Passed - Last BP in normal range    BP Readings from Last 1 Encounters:  07/23/24 125/78         Passed - Last Heart Rate in normal range    Pulse Readings from Last 1 Encounters:  07/23/24 80         Passed - Valid encounter within last 6 months    Recent Outpatient Visits            3 months ago Dysuria   [facility] Le[facility] M[facility], [facility]-C   3 months ago Dysuria   [facility] Leavy Mole, PA-C   4 months ago Encounter for medical examination to establish care   [facility] Leavy Mole, [place]       Future Appointments             In 2 weeks Stoioff, Glendia BROCKS, MD [facility] [place]   In 4 months Vivienne, Lonni Ingle, NP [facility]

## 2024-07-31 NOTE — Telephone Encounter (Signed)
 Requested medication (s) are due for refill today: Yes  Requested medication (s) are on the active medication list: Yes  Last refill:  12/24/23  Future visit scheduled: Yes  Notes to clinic:  Unable to refill per protocol, last refill by another provider.      Requested Prescriptions  Pending Prescriptions Disp Refills   ONETOUCH VERIO test strip [Pharmacy Med Name: glucose blood test strip] 100 each 0    Sig: Use as directed.     Endocrinology: Diabetes - Testing Supplies Passed - 07/31/2024  5:14 PM      Passed - Valid encounter within last 12 months    Recent Outpatient Visits           3 months ago Dysuria   [facility] Leavy Mole, [facility]-C   3 months ago Dysuria   [facility] Leavy Mole, PA-C   4 months ago Encounter for medical examination to establish care   [facility] Leavy Mole, [place]       Future Appointments             In 2 weeks Twylla, Glendia BROCKS, MD [facility]   In 4 months Vivienne, Lonni Ingle, NP [facility]            Signed Prescriptions Disp Refills   losartan  (COZAAR ) 50 MG tablet 90 tablet 0    Sig: Take 1 tablet (50 mg total) by mouth daily.     Cardiovascular:  Angiotensin Receptor Blockers Failed - 07/31/2024  5:14 PM      Failed - Cr in normal range and within 180 days    Creat  Date Value Ref Range Status  03/08/2024 0.81 0.60 - 1.00 mg/dL Final   Creatinine, Ser  Date Value Ref Range Status  07/09/2024 1.05 (H) 0.44 - 1.00 mg/dL Final   Creatinine, Urine  Date Value Ref Range Status  03/08/2024 190 20 - 275 mg/dL Final         Passed - K in normal range and within 180 days    Potassium  Date Value Ref Range Status  07/09/2024 3.9 3.5 - 5.1 mmol/L Final  01/05/2014 3.9 3.5 - 5.1 mmol/L Final         Passed - Patient is not pregnant      Passed - Last BP in normal range    BP Readings from  Last 1 Encounters:  07/23/24 125/78         Passed - Valid encounter within last 6 months    Recent Outpatient Visits           3 months ago Dysuria   [facility] Leavy Mole, [facility]-C   3 months ago Dysuria   [facility] Leavy Mole, PA-C   4 months ago Encounter for medical examination to establish care   [facility] Leavy Mole, [place]       Future Appointments             In 2 weeks Stoioff, Glendia BROCKS, MD [facility] [place]   In 4 months Vivienne, Lonni Ingle, NP [facility]

## 2024-08-01 ENCOUNTER — Other Ambulatory Visit: Payer: Self-pay

## 2024-08-02 ENCOUNTER — Other Ambulatory Visit: Payer: Self-pay

## 2024-08-02 MED FILL — Glucose Blood Test Strip: 30 days supply | Qty: 100 | Fill #0 | Status: AC

## 2024-08-02 NOTE — Progress Notes (Signed)
 Left message to check on patient. I left a voice mail for her to call us  and let us  know how she is doing.

## 2024-08-03 ENCOUNTER — Other Ambulatory Visit: Payer: Self-pay

## 2024-08-03 ENCOUNTER — Encounter: Payer: Self-pay | Admitting: Internal Medicine

## 2024-08-03 ENCOUNTER — Ambulatory Visit (INDEPENDENT_AMBULATORY_CARE_PROVIDER_SITE_OTHER): Admitting: Internal Medicine

## 2024-08-03 VITALS — BP 130/70 | HR 100 | Temp 98.1°F | Resp 16 | Ht 60.0 in | Wt 189.9 lb

## 2024-08-03 DIAGNOSIS — Z09 Encounter for follow-up examination after completed treatment for conditions other than malignant neoplasm: Secondary | ICD-10-CM | POA: Diagnosis not present

## 2024-08-03 DIAGNOSIS — J3489 Other specified disorders of nose and nasal sinuses: Secondary | ICD-10-CM

## 2024-08-03 DIAGNOSIS — Z23 Encounter for immunization: Secondary | ICD-10-CM | POA: Diagnosis not present

## 2024-08-03 DIAGNOSIS — E1165 Type 2 diabetes mellitus with hyperglycemia: Secondary | ICD-10-CM | POA: Diagnosis not present

## 2024-08-03 DIAGNOSIS — I10 Essential (primary) hypertension: Secondary | ICD-10-CM | POA: Diagnosis not present

## 2024-08-03 DIAGNOSIS — Z7985 Long-term (current) use of injectable non-insulin antidiabetic drugs: Secondary | ICD-10-CM

## 2024-08-03 DIAGNOSIS — N390 Urinary tract infection, site not specified: Secondary | ICD-10-CM | POA: Diagnosis not present

## 2024-08-03 DIAGNOSIS — I5032 Chronic diastolic (congestive) heart failure: Secondary | ICD-10-CM

## 2024-08-03 DIAGNOSIS — B9689 Other specified bacterial agents as the cause of diseases classified elsewhere: Secondary | ICD-10-CM

## 2024-08-03 DIAGNOSIS — K219 Gastro-esophageal reflux disease without esophagitis: Secondary | ICD-10-CM

## 2024-08-03 LAB — POCT GLYCOSYLATED HEMOGLOBIN (HGB A1C): Hemoglobin A1C: 6 % — AB (ref 4.0–5.6)

## 2024-08-03 MED ORDER — PANTOPRAZOLE SODIUM 40 MG PO TBEC
40.0000 mg | DELAYED_RELEASE_TABLET | Freq: Every day | ORAL | 1 refills | Status: AC
  Filled 2024-08-03 – 2024-10-05 (×2): qty 90, 90d supply, fill #0
  Filled 2024-10-25: qty 90, 90d supply, fill #1

## 2024-08-03 MED ORDER — QUETIAPINE FUMARATE 25 MG PO TABS
25.0000 mg | ORAL_TABLET | Freq: Every day | ORAL | 0 refills | Status: AC
  Filled 2024-08-03: qty 90, 90d supply, fill #0

## 2024-08-03 MED ORDER — FLUTICASONE PROPIONATE 50 MCG/ACT NA SUSP
2.0000 | Freq: Every day | NASAL | 6 refills | Status: AC
  Filled 2024-08-03: qty 16, 30d supply, fill #0
  Filled 2024-10-05: qty 16, 30d supply, fill #1

## 2024-08-03 MED ORDER — SERTRALINE HCL 100 MG PO TABS
200.0000 mg | ORAL_TABLET | Freq: Every day | ORAL | 0 refills | Status: AC
  Filled 2024-08-03: qty 180, 90d supply, fill #0

## 2024-08-03 MED ORDER — LAMOTRIGINE 25 MG PO TABS
50.0000 mg | ORAL_TABLET | Freq: Every day | ORAL | 0 refills | Status: DC
  Filled 2024-08-03: qty 60, 30d supply, fill #0

## 2024-08-03 MED ORDER — HYDROCHLOROTHIAZIDE 12.5 MG PO TABS
12.5000 mg | ORAL_TABLET | Freq: Every day | ORAL | 1 refills | Status: AC
  Filled 2024-10-05 – 2024-10-06 (×2): qty 90, 90d supply, fill #0
  Filled 2024-10-25: qty 90, 90d supply, fill #1

## 2024-08-03 MED ORDER — TRAZODONE HCL 100 MG PO TABS
100.0000 mg | ORAL_TABLET | Freq: Every evening | ORAL | 0 refills | Status: AC | PRN
  Filled 2024-08-03: qty 90, 90d supply, fill #0

## 2024-08-03 NOTE — Progress Notes (Signed)
 Established Patient Office Visit  Subjective   Patient ID: April Singleton, female    DOB: 1951-06-27  Age: 73 y.o. MRN: [id number]  Chief Complaint  Patient presents with   Hospitalization Follow-up    HPI  Patient is here for hospital follow up. This is my first time meeting her.   Discussed the use of AI scribe software for clinical note transcription with the patient, who gave verbal consent to proceed.  History of Present Illness April Singleton is a 73 year old female with recurrent urinary tract infections who presents with ongoing symptoms despite treatment.  She has persistent symptoms of a urinary tract infection despite antibiotic treatment. Previously hospitalized for a severe UTI, she received IV antibiotics. A recent urine culture showed gram-negative rods, with specific bacteria and antibiotic susceptibilities pending. She has been on Augmentin  multiple times without effectiveness. She experiences severe burning in her urethra, especially at night, affecting her sleep.  Her medical history includes hypertension, diabetes, coronary artery disease, and mild diastolic dysfunction. She takes losartan , hydrochlorothiazide , and verapamil  for blood pressure. Her diabetes is well-controlled with Ozempic , with an A1c of 6.0. She experiences some swelling in her feet, attributed to arthritis.  She underwent a total knee replacement two years ago, complicated by a torn tendon during physical therapy, leading to prolonged physical therapy and back issues. 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  She has a recent onset of coughing and ear pressure, attributed to sinus issues. She has used Flonase in the past for similar symptoms. No fever or chills are present.   Discharge Date: 07/09/24 Diagnosis: Resistant UTI Procedures/tests: urine culture showing resistant klebsiella pneumonia UTI, only susceptible to oral Augmentin  which she failed  outpatient and required IV antibiotics. Seen by ID and treated with IV Ertapenem.  Consultants: ID New medications: Back on oral Augmentin  per Urology Discontinued medications: None Status: fluctuating   Patient Active Problem List   Diagnosis Date Noted   Infection due to ESBL-producing Klebsiella pneumoniae 07/09/2024   Obesity, Class III, BMI 40-49.9 (morbid obesity) (HCC) 07/08/2024   Chronic heart failure with preserved ejection fraction (HFpEF) (HCC) 07/08/2024   UTI due to Klebsiella species 07/08/2024   CAD (coronary artery disease)    Depression    UTI due to to multidrug-resistant Klebsiella pneumonia 06/22/24 07/07/2024   Iron  deficiency anemia 03/17/2024   Psoriatic arthritis (HCC) 02/20/2023   Total knee replacement status 04/29/2022   Arthritis of right hip 04/05/2022   Trochanteric bursitis of right hip 04/05/2022   Type 2 diabetes mellitus without complications (HCC) 04/01/2022   Primary osteoarthritis of left knee 02/10/2022   Panniculitis 06/12/2021   Primary osteoarthritis of right knee 10/26/2020   Dyslipidemia 07/24/2020   Cirrhosis of liver without ascites (HCC) 07/24/2020   Rotator cuff arthropathy of left shoulder 03/27/2020   Class 2 severe obesity with serious comorbidity and body mass index (BMI) of 36.0 to 36.9 in adult 03/27/2020   Essential hypertension 03/27/2020   Type 2 diabetes mellitus with hyperglycemia, without long-term current use of insulin  (HCC) 03/15/2020   Chronic fatigue 03/25/2018   Chronic foot pain, right 06/10/2016   Past Medical History:  Diagnosis Date   Anemia    Arthritis    Asthma    CAD (coronary artery disease)    a. 05/2024 Cor CTA: Ca2+ = 817 (95th%'ile). LM <25, LAD 25-49p, LCX non-dominant, OM1 <25, RCA dominant 25-49p, 50-46m, 50-69d, RPDA 50-69.  CT FFR of RCA 0.94/0.91/0.80 ->  non-obs dzs.   Depression    Diabetes mellitus without complication (HCC)    Diastolic dysfunction    a. 04/2024 Echo: EF 60-65%, GrI DD, nl  RV size/fxn, AoV sclerosis w/o stenosis.   GERD (gastroesophageal reflux disease)    Hypertension    Nephrolithiasis    a. 02/2024 CT Abd: 4mm R lower pole renal stone.   Pneumonia    PONV (postoperative nausea and vomiting)    Superior mesenteric artery stenosis    a. 02/2024 CT Abd: sev SMA stenosis.   Past Surgical History:  Procedure Laterality Date   ABDOMINAL HYSTERECTOMY     BREAST BIOPSY     Pt unsure of type of biopsy or even of which side   KNEE ARTHROPLASTY Left 04/29/2022   Procedure: COMPUTER ASSISTED TOTAL KNEE ARTHROPLASTY;  Surgeon: Mardee Lynwood SQUIBB, MD;  Location: [facility] ORS;  Service: Orthopedics;  Laterality: Left;   REDUCTION MAMMAPLASTY     TONSILLECTOMY     Social History   Tobacco Use   Smoking status: Never   Smokeless tobacco: Never  Vaping Use   Vaping status: Never Used  Substance Use Topics   Alcohol use: Not Currently    Comment: rarely   Drug use: Never   Social History   Socioeconomic History   Marital status: Married    Spouse name: Danny   Number of children: 3   Years of education: Not on file   Highest education level: Not on file  Occupational History   Not on file  Tobacco Use   Smoking status: Never   Smokeless tobacco: Never  Vaping Use   Vaping status: Never Used  Substance and Sexual Activity   Alcohol use: Not Currently    Comment: rarely   Drug use: Never   Sexual activity: Yes    Birth control/protection: Surgical    Comment: Hysterectomy  Other Topics Concern   Not on file  Social History Narrative   3 grandchildren and daughter in the home   Social Drivers of Health   Financial Resource Strain: Low Risk  (03/08/2024)   Overall Financial Resource Strain (CARDIA)    Difficulty of Paying Living Expenses: Not hard at all  Food Insecurity: No Food Insecurity (07/08/2024)   Hunger Vital Sign    Worried About Running Out of Food in the Last Year: Never true    Ran Out of Food in the Last Year: Never true   Transportation Needs: No Transportation Needs (07/08/2024)   PRAPARE - Administrator, Civil Service (Medical): No    Lack of Transportation (Non-Medical): No  Physical Activity: Sufficiently Active (03/08/2024)   Exercise Vital Sign    Days of Exercise per Week: 7 days    Minutes of Exercise per Session: 30 min  Stress: No Stress Concern Present (03/08/2024)   [employer] of Occupational Health - Occupational Stress Questionnaire    Feeling of Stress : Not at all  Social Connections: Moderately Integrated (07/08/2024)   Social Connection and Isolation Panel    Frequency of Communication with Friends and Family: More than three times a week    Frequency of Social Gatherings with Friends and Family: More than three times a week    Attends Religious Services: More than 4 times per year    Active Member of [employer] or Organizations: No    Attends Banker Meetings: Never    Marital Status: Married  Catering Manager Violence: Not At Risk (07/08/2024)   Humiliation, Afraid,  Rape, and Kick questionnaire    Fear of Current or Ex-Partner: No    Emotionally Abused: No    Physically Abused: No    Sexually Abused: No   Family Status  Relation Name Status   Mother  Deceased   Father  Deceased   Sister  (Not Specified)   Brother  (Not Specified)   Mat Aunt  Deceased   Mat Aunt  Deceased   Mat Uncle  (Not Specified)   Oceanographer  (Not Specified)   MGM  Deceased   MGF  Deceased  No partnership data on file   Family History  Problem Relation Age of Onset   Heart disease Mother    Lung cancer Mother    Hypertension Mother    Congestive Heart Failure Mother    Hypertension Father    Liver cancer Father    Lung cancer Father    Hypertension Sister    Hypertension Brother    Breast cancer Maternal Aunt    Breast cancer Maternal Aunt    Hypertension Maternal Uncle    Hypertension Paternal Aunt    Heart failure Maternal Grandmother    Fainting Maternal Grandfather     Heart disease Maternal Grandfather    Allergies  Allergen Reactions   Codeine Other (See Comments)    Nausea and vomiting   Prednisone  Other (See Comments)    Makes her violent , patient reports only in high doses   Sulfa Antibiotics Hives      Review of Systems  Constitutional:  Negative for fever.  Genitourinary:  Positive for dysuria. Negative for frequency and urgency.      Objective:     BP 130/70 (Cuff Size: Large)   Pulse 100   Temp 98.1 F (36.7 C) (Oral)   Resp 16   Ht 5' (1.524 m)   Wt 189 lb 14.4 oz (86.1 kg)   SpO2 97%   BMI 37.09 kg/m  BP Readings from Last 3 Encounters:  08/03/24 130/70  07/23/24 125/78  07/14/24 139/89   Wt Readings from Last 3 Encounters:  08/03/24 189 lb 14.4 oz (86.1 kg)  07/23/24 185 lb (83.9 kg)  07/14/24 187 lb (84.8 kg)      Physical Exam Constitutional:      Appearance: Normal appearance.  HENT:     Head: Normocephalic and atraumatic.     Right Ear: Tympanic membrane, ear canal and external ear normal.     Left Ear: Tympanic membrane, ear canal and external ear normal.     Mouth/Throat:     Mouth: Mucous membranes are moist.     Pharynx: Posterior oropharyngeal erythema present.  Eyes:     Conjunctiva/sclera: Conjunctivae normal.  Cardiovascular:     Rate and Rhythm: Normal rate and regular rhythm.     Pulses:          Dorsalis pedis pulses are 2+ on the right side and 2+ on the left side.  Pulmonary:     Effort: Pulmonary effort is normal.     Breath sounds: Normal breath sounds.  Musculoskeletal:     Right foot: Normal range of motion. No deformity, bunion, Charcot foot, foot drop or prominent metatarsal heads.     Left foot: Normal range of motion. No deformity, bunion, Charcot foot, foot drop or prominent metatarsal heads.  Feet:     Right foot:     Protective Sensation: 6 sites tested.  6 sites sensed.     Skin integrity: Skin integrity normal.  Toenail Condition: Right toenails are normal.      Left foot:     Protective Sensation: 6 sites tested.  6 sites sensed.     Skin integrity: Skin integrity normal.     Toenail Condition: Left toenails are normal.  Skin:    General: Skin is warm and dry.  Neurological:     General: No focal deficit present.     Mental Status: She is alert. Mental status is at baseline.  Psychiatric:        Mood and Affect: Mood normal.        Behavior: Behavior normal.      No results found for any visits on 08/03/24.  Last CBC Lab Results  Component Value Date   WBC 6.0 07/13/2024   HGB 13.6 07/13/2024   HCT 41.0 07/13/2024   MCV 83.5 07/13/2024   MCH 27.7 07/13/2024   RDW 13.7 07/13/2024   PLT 171 07/13/2024   Last metabolic panel Lab Results  Component Value Date   GLUCOSE 95 07/09/2024   NA 139 07/09/2024   K 3.9 07/09/2024   CL 104 07/09/2024   CO2 28 07/09/2024   BUN 16 07/09/2024   CREATININE 1.05 (H) 07/09/2024   GFRNONAA 56 (L) 07/09/2024   CALCIUM  9.1 07/09/2024   PHOS 3.9 01/05/2014   PROT 7.8 03/22/2024   ALBUMIN 3.8 03/22/2024   BILITOT 0.6 03/22/2024   ALKPHOS 55 03/22/2024   AST 25 03/22/2024   ALT 19 03/22/2024   ANIONGAP 7 07/09/2024   Last lipids Lab Results  Component Value Date   CHOL 193 03/08/2024   HDL 88 03/08/2024   LDLCALC 74 03/08/2024   TRIG 211 (H) 03/08/2024   CHOLHDL 2.2 03/08/2024   Last hemoglobin A1c Lab Results  Component Value Date   HGBA1C 6.1 (H) 03/08/2024   Last thyroid  functions Lab Results  Component Value Date   TSH 3.05 09/11/2022   Last vitamin D No results found for: 25OHVITD2, 25OHVITD3, VD25OH Last vitamin B12 and Folate Lab Results  Component Value Date   VITAMINB12 676 05/22/2018   FOLATE 15.2 05/22/2018      The 10-year ASCVD risk score (Arnett DK, et al., 2019) is: 27.2%    Assessment & Plan:   Assessment & Plan Urinary tract infection, pending culture Recurrent UTI with gram-negative rods. Previous Augmentin  treatment ineffective, required  IV antibiotics. Awaiting culture for specific bacteria and susceptibilities. - Await final culture results to determine specific bacteria and antibiotic susceptibilities. - Continue Augmentin  as prescribed. - Take probiotics to mitigate gastrointestinal side effects of Augmentin . - Follow up with Urology for culture results and further management.  Type 2 diabetes mellitus Well-controlled with A1c of 6.0. - Performed foot exam and urine test to monitor for complications. - Continue current diabetes management regimen.  Essential hypertension Well-controlled with losartan , hydrochlorothiazide , and verapamil . BP 130/70 mmHg. - Refilled hydrochlorothiazide  prescription. - Continue current antihypertensive regimen.  Diastolic heart failure (heart failure with preserved ejection fraction) Diastolic heart failure with preserved ejection fraction, mild LV hypertrophy, grade 1 diastolic dysfunction. Normal ejection fraction. - Continue current management without additional medication for heart failure.  Gastroesophageal reflux disease Managed with daily pantoprazole . - Refilled pantoprazole  prescription.  Sinus Pressure Sinus pressure likely contributing to ear pressure and cough. Possibly related to allergies or weather. - Prescribed Flonase nasal spray, two sprays in each nostril twice daily. - Advised use of nasal saline before Flonase to prevent dryness and potential epistaxis. - Monitor for emotional changes due  to steroid use and discontinue if necessary.  - POCT HgB A1C - HM Diabetes Foot Exam - hydrochlorothiazide  (HYDRODIURIL ) 12.5 MG tablet; Take 1 tablet (12.5 mg total) by mouth daily.  Dispense: 90 tablet; Refill: 1 - fluticasone (FLONASE) 50 MCG/ACT nasal spray; Place 2 sprays into both nostrils daily.  Dispense: 16 g; Refill: 6 - pantoprazole  (PROTONIX ) 40 MG tablet; Take 1 tablet (40 mg total) by mouth daily.  Dispense: 90 tablet; Refill: 1 - Flu vaccine HIGH DOSE PF(Fluzone  Trivalent)   Return in about 6 months (around 01/31/2025) for with me.    Sharyle Fischer, DO

## 2024-08-04 ENCOUNTER — Inpatient Hospital Stay: Admitting: Family Medicine

## 2024-08-05 ENCOUNTER — Ambulatory Visit: Payer: Self-pay | Admitting: Urology

## 2024-08-05 LAB — CULTURE, URINE COMPREHENSIVE

## 2024-08-13 ENCOUNTER — Ambulatory Visit: Admitting: Urology

## 2024-08-13 ENCOUNTER — Other Ambulatory Visit: Payer: Self-pay

## 2024-08-13 ENCOUNTER — Encounter: Payer: Self-pay | Admitting: Urology

## 2024-08-13 VITALS — BP 131/73 | HR 81 | Ht 60.0 in | Wt 185.0 lb

## 2024-08-13 DIAGNOSIS — N390 Urinary tract infection, site not specified: Secondary | ICD-10-CM | POA: Diagnosis not present

## 2024-08-13 LAB — URINALYSIS, COMPLETE
Bilirubin, UA: NEGATIVE
Glucose, UA: NEGATIVE
Ketones, UA: NEGATIVE
Nitrite, UA: POSITIVE — AB
Protein,UA: NEGATIVE
RBC, UA: NEGATIVE
Specific Gravity, UA: 1.025 (ref 1.005–1.030)
Urobilinogen, Ur: 0.2 mg/dL (ref 0.2–1.0)
pH, UA: 6 (ref 5.0–7.5)

## 2024-08-13 LAB — MICROSCOPIC EXAMINATION
Epithelial Cells (non renal): >10 /HPF — AB (ref 0–10)
WBC, UA: >30 /HPF — AB (ref 0–5)

## 2024-08-13 MED ORDER — GEMTESA 75 MG PO TABS
1.0000 | ORAL_TABLET | Freq: Every day | ORAL | 11 refills | Status: AC
  Filled 2024-08-13: qty 30, 30d supply, fill #0
  Filled 2024-10-05: qty 30, 30d supply, fill #1

## 2024-08-13 MED ORDER — AMOXICILLIN-POT CLAVULANATE 875-125 MG PO TABS
1.0000 | ORAL_TABLET | Freq: Two times a day (BID) | ORAL | 0 refills | Status: DC
  Filled 2024-08-13: qty 14, 7d supply, fill #0

## 2024-08-13 NOTE — Progress Notes (Signed)
   08/13/24  CC:  Chief Complaint  Patient presents with   Cysto    HPI: Symptomatic MDR Klebsiella UTI.  Symptoms resolved on antibiotic therapy then return 2-3 days after discontinuing.  Some improvement in her baseline OAB symptoms on Gemtesa.  Recently completed course of Augmentin  a few days ago and complaining of increased frequency, urgency, dysuria.  UA today >30 WBC/>10 epis  Blood pressure 131/73, pulse 81, height 5' (1.524 m), weight 185 lb (83.9 kg). NED. A&Ox3.   No respiratory distress   Abd soft, NT, ND Normal external genitalia with patent urethral meatus  Cystoscopy Procedure Note  Patient identification was confirmed, informed consent was obtained, and patient was prepped using Betadine solution.  Lidocaine  jelly was administered per urethral meatus.    Procedure: - Flexible cystoscope introduced, without any difficulty.   - Thorough search of the bladder revealed:    normal urethral meatus    normal urothelium    no stones    no ulcers     no tumors    no urethral polyps    no trabeculation  - Ureteral orifices were normal in position and appearance.  Post-Procedure: - Patient tolerated the procedure well  Assessment/ Plan:  Recurrent MDR Klebsiella UTI Catheterized urine was obtained and sent for culture  Restart Augmentin  pending culture Rx Gemtesa sent : Low-dose vaginal estrogen Has been seen by ID and was felt to be colonized with symptoms related to OAB.  She would like a second opinion and [facility] referral sent    Glendia JAYSON Barba, MD

## 2024-08-17 LAB — CULTURE, URINE COMPREHENSIVE

## 2024-08-18 ENCOUNTER — Other Ambulatory Visit: Admitting: Urology

## 2024-08-18 ENCOUNTER — Telehealth: Payer: Self-pay

## 2024-08-18 ENCOUNTER — Other Ambulatory Visit: Payer: Self-pay

## 2024-08-18 DIAGNOSIS — N390 Urinary tract infection, site not specified: Secondary | ICD-10-CM

## 2024-08-18 MED ORDER — DOXYCYCLINE HYCLATE 100 MG PO CAPS
100.0000 mg | ORAL_CAPSULE | Freq: Two times a day (BID) | ORAL | 0 refills | Status: DC
  Filled 2024-08-18: qty 14, 7d supply, fill #0

## 2024-08-18 NOTE — Telephone Encounter (Signed)
 Pt called in with c/o burning with urination, frequency, urgency and chills with a temperature of 100.1. Pt is currently on Augmentin  for a positive culture and history of recurrent UTI. Patient is requesting a change in antibiotics to something else. Patient is currently being referred to [facility] but that referral process has not been completed by our referrals team yet so she is in limbo with getting an appointment with the other clinic.

## 2024-08-25 ENCOUNTER — Other Ambulatory Visit: Payer: Self-pay

## 2024-08-25 MED ORDER — DOXYCYCLINE HYCLATE 100 MG PO CAPS
100.0000 mg | ORAL_CAPSULE | Freq: Two times a day (BID) | ORAL | 0 refills | Status: AC
  Filled 2024-08-25: qty 14, 7d supply, fill #0

## 2024-08-25 NOTE — Addendum Note (Signed)
 Addended by: CLEOTILDE HELLER L on: 08/25/2024 11:56 AM   Modules accepted: Orders

## 2024-08-30 ENCOUNTER — Other Ambulatory Visit: Payer: Self-pay

## 2024-08-30 MED ORDER — LAMOTRIGINE 25 MG PO TABS
50.0000 mg | ORAL_TABLET | Freq: Every day | ORAL | 0 refills | Status: DC
  Filled 2024-08-30: qty 90, 45d supply, fill #0

## 2024-09-01 ENCOUNTER — Other Ambulatory Visit: Payer: Self-pay

## 2024-09-01 MED ORDER — CELECOXIB 100 MG PO CAPS
100.0000 mg | ORAL_CAPSULE | Freq: Two times a day (BID) | ORAL | 3 refills | Status: AC
  Filled 2024-09-01: qty 60, 30d supply, fill #0
  Filled 2024-10-05: qty 60, 30d supply, fill #1
  Filled 2024-10-25: qty 60, 30d supply, fill #2

## 2024-09-02 ENCOUNTER — Ambulatory Visit: Payer: Self-pay | Admitting: Nurse Practitioner

## 2024-09-02 LAB — HEPATIC FUNCTION PANEL
ALT: 18 IU/L (ref 0–32)
AST: 21 IU/L (ref 0–40)
Albumin: 4.2 g/dL (ref 3.8–4.8)
Alkaline Phosphatase: 75 IU/L (ref 49–135)
Bilirubin Total: <0.2 mg/dL (ref 0.0–1.2)
Bilirubin, Direct: 0.09 mg/dL (ref 0.00–0.40)
Total Protein: 6.8 g/dL (ref 6.0–8.5)

## 2024-09-02 LAB — LIPID PANEL
Chol/HDL Ratio: 1.9 ratio (ref 0.0–4.4)
Cholesterol, Total: 150 mg/dL (ref 100–199)
HDL: 79 mg/dL (ref >39)
LDL Chol Calc (NIH): 43 mg/dL (ref 0–99)
Triglycerides: 174 mg/dL — ABNORMAL HIGH (ref 0–149)
VLDL Cholesterol Cal: 28 mg/dL (ref 5–40)

## 2024-09-13 ENCOUNTER — Telehealth: Payer: Self-pay | Admitting: Orthopedic Surgery

## 2024-09-13 NOTE — Telephone Encounter (Signed)
 Pt called and said she needs to verify the prescription you all talked about. CB#[phone]

## 2024-09-14 NOTE — Telephone Encounter (Signed)
 I left voicemail advising. ?

## 2024-09-16 NOTE — Telephone Encounter (Signed)
 Pt called the triage line stating she is having nausea, vomiting, burning, urinary retention and feels like she has a fever coming on. Pt was advise that based on her symptoms she would need to go to the ER. Pt states that she will not go to the ER, pt was informed that her symptoms are red flags for her to go to the ER. Pt states she will sit here and die than to go to the ER pt states she did not want to sit and wait in the ER . I suggested urgent care if she didn't want to wait for a long period of time. Then pt states she wasn't going to go to either urgent care or ER. Pt was reiterated about our recommend to go to the ER.  Pt then asked if Dr.Stoioff could send something to make her feel better. Pt was informed of our protocol. Pt would need to come in to see a provider and give us  a urine sample but with her symptoms it is recommend to go to the ER. Spoke with [facility], also recommend pt to go to there ER.   Pt then went on a rant stating Dr.Stoioff was her Doctor and he should see and treat her and that if she gets worse or dies then we will hear about it then pt hanged the phone up.

## 2024-09-17 ENCOUNTER — Telehealth: Payer: Self-pay

## 2024-09-17 NOTE — Telephone Encounter (Signed)
 Please ignore this my pervious documentation. documented under wrong encounter.

## 2024-09-17 NOTE — Telephone Encounter (Signed)
 Pt walked into clinic today requesting to give a urine for testing.   Pt called triage yesterday and was advised to be seen at ED or UC. She refused.   Confirmed with SCS,  SV, and SM.................SABRAbest course of action for this pt is to be seen in ED or urgent care.  Reiterated this to pt again. Pt refuses to be seen in the ED or UC.

## 2024-09-17 NOTE — Telephone Encounter (Signed)
 Encounter for yesterdays event with pt. 09/16/2024 at 3:16, documented under different encounter.  Pt called the triage line stating she is having nausea, vomiting, burning, urinary retention and feels like she has a fever coming on. Pt was advise that based on her symptoms she would need to go to the ER. Pt states that she will not go to the ER, pt was informed that her symptoms are red flags for her to go to the ER. Pt states she will sit here and die than to go to the ER pt states she did not want to sit and wait in the ER . I suggested urgent care if she didn't want to wait for a long period of time. Then pt states she wasn't going to go to either urgent care or ER. Pt was reiterated about our recommend to go to the ER.   Pt then asked if Dr.Stoioff could send something to make her feel better. Pt was informed of our protocol. Pt would need to come in to see a provider and give us  a urine sample but with her symptoms it is recommend to go to the ER. Spoke with [facility], also recommend pt to go to there ER.    Pt then went on a rant stating Dr.Stoioff was her Doctor and he should see and treat her and that if she gets worse or dies then we will hear about it then pt hanged the phone up.

## 2024-10-05 ENCOUNTER — Other Ambulatory Visit: Payer: Self-pay

## 2024-10-05 MED ORDER — SILVER SULFADIAZINE 1 % EX CREA
TOPICAL_CREAM | Freq: Every day | CUTANEOUS | 1 refills | Status: AC
  Filled 2024-10-05: qty 50, 50d supply, fill #0

## 2024-10-05 MED FILL — Losartan Potassium Tab 50 MG: ORAL | 90 days supply | Qty: 90 | Fill #0 | Status: AC

## 2024-10-06 ENCOUNTER — Other Ambulatory Visit: Payer: Self-pay

## 2024-10-07 ENCOUNTER — Other Ambulatory Visit: Payer: Self-pay

## 2024-10-08 ENCOUNTER — Other Ambulatory Visit: Payer: Self-pay

## 2024-10-08 ENCOUNTER — Emergency Department (HOSPITAL_COMMUNITY)
Admission: EM | Admit: 2024-10-08 | Discharge: 2024-10-09 | Disposition: A | Attending: Emergency Medicine | Admitting: Emergency Medicine

## 2024-10-08 ENCOUNTER — Encounter (HOSPITAL_COMMUNITY): Payer: Self-pay | Admitting: *Deleted

## 2024-10-08 DIAGNOSIS — I10 Essential (primary) hypertension: Secondary | ICD-10-CM | POA: Insufficient documentation

## 2024-10-08 DIAGNOSIS — D72819 Decreased white blood cell count, unspecified: Secondary | ICD-10-CM | POA: Insufficient documentation

## 2024-10-08 DIAGNOSIS — R300 Dysuria: Secondary | ICD-10-CM | POA: Insufficient documentation

## 2024-10-08 DIAGNOSIS — Z79899 Other long term (current) drug therapy: Secondary | ICD-10-CM | POA: Diagnosis not present

## 2024-10-08 DIAGNOSIS — Z794 Long term (current) use of insulin: Secondary | ICD-10-CM | POA: Insufficient documentation

## 2024-10-08 DIAGNOSIS — R002 Palpitations: Secondary | ICD-10-CM | POA: Insufficient documentation

## 2024-10-08 DIAGNOSIS — E1165 Type 2 diabetes mellitus with hyperglycemia: Secondary | ICD-10-CM | POA: Insufficient documentation

## 2024-10-08 DIAGNOSIS — R0602 Shortness of breath: Secondary | ICD-10-CM | POA: Insufficient documentation

## 2024-10-08 DIAGNOSIS — R3915 Urgency of urination: Secondary | ICD-10-CM | POA: Insufficient documentation

## 2024-10-08 DIAGNOSIS — R350 Frequency of micturition: Secondary | ICD-10-CM | POA: Diagnosis present

## 2024-10-08 DIAGNOSIS — Z7984 Long term (current) use of oral hypoglycemic drugs: Secondary | ICD-10-CM | POA: Insufficient documentation

## 2024-10-08 DIAGNOSIS — J45909 Unspecified asthma, uncomplicated: Secondary | ICD-10-CM | POA: Insufficient documentation

## 2024-10-08 DIAGNOSIS — R079 Chest pain, unspecified: Secondary | ICD-10-CM | POA: Diagnosis not present

## 2024-10-08 LAB — CBC WITH DIFFERENTIAL/PLATELET
Abs Immature Granulocytes: 0.02 K/uL (ref 0.00–0.07)
Basophils Absolute: 0 K/uL (ref 0.0–0.1)
Basophils Relative: 1 %
Eosinophils Absolute: 0.2 K/uL (ref 0.0–0.5)
Eosinophils Relative: 3 %
HCT: 40.9 % (ref 36.0–46.0)
Hemoglobin: 13.1 g/dL (ref 12.0–15.0)
Immature Granulocytes: 0 %
Lymphocytes Relative: 27 %
Lymphs Abs: 1.7 K/uL (ref 0.7–4.0)
MCH: 28.1 pg (ref 26.0–34.0)
MCHC: 32 g/dL (ref 30.0–36.0)
MCV: 87.8 fL (ref 80.0–100.0)
Monocytes Absolute: 0.5 K/uL (ref 0.1–1.0)
Monocytes Relative: 8 %
Neutro Abs: 4 K/uL (ref 1.7–7.7)
Neutrophils Relative %: 61 %
Platelets: 185 K/uL (ref 150–400)
RBC: 4.66 MIL/uL (ref 3.87–5.11)
RDW: 13.2 % (ref 11.5–15.5)
WBC: 6.4 K/uL (ref 4.0–10.5)
nRBC: 0 % (ref 0.0–0.2)

## 2024-10-08 LAB — URINALYSIS, W/ REFLEX TO CULTURE (INFECTION SUSPECTED)
Bilirubin Urine: NEGATIVE
Glucose, UA: NEGATIVE mg/dL
Hgb urine dipstick: NEGATIVE
Ketones, ur: NEGATIVE mg/dL
Leukocytes,Ua: NEGATIVE
Nitrite: POSITIVE — AB
Protein, ur: NEGATIVE mg/dL
Specific Gravity, Urine: 1.017 (ref 1.005–1.030)
pH: 5 (ref 5.0–8.0)

## 2024-10-08 LAB — COMPREHENSIVE METABOLIC PANEL WITH GFR
ALT: 23 U/L (ref 0–44)
AST: 27 U/L (ref 15–41)
Albumin: 4 g/dL (ref 3.5–5.0)
Alkaline Phosphatase: 73 U/L (ref 38–126)
Anion gap: 12 (ref 5–15)
BUN: 17 mg/dL (ref 8–23)
CO2: 24 mmol/L (ref 22–32)
Calcium: 9.7 mg/dL (ref 8.9–10.3)
Chloride: 103 mmol/L (ref 98–111)
Creatinine, Ser: 0.84 mg/dL (ref 0.44–1.00)
GFR, Estimated: >60 mL/min
Glucose, Bld: 103 mg/dL — ABNORMAL HIGH (ref 70–99)
Potassium: 4.3 mmol/L (ref 3.5–5.1)
Sodium: 139 mmol/L (ref 135–145)
Total Bilirubin: <0.2 mg/dL (ref 0.0–1.2)
Total Protein: 7.3 g/dL (ref 6.5–8.1)

## 2024-10-08 NOTE — ED Triage Notes (Signed)
 The pt took somce tylenol  at 1600 today for a fever

## 2024-10-08 NOTE — ED Triage Notes (Signed)
 The pt reports that she has had a uit since may and for the past week she has pain and some blood when she wipes fever urinary frequency

## 2024-10-08 NOTE — ED Provider Triage Note (Signed)
 Emergency Medicine Provider Triage Evaluation Note  April Singleton , a 74 y.o. female  was evaluated in triage.  Pt complains of UTI, history of same.  Was on 56-month course of doxycycline  without relief  Review of Systems  Positive: Hematuria, urgency, frequency, fever, chills, nausea Negative: Chest pain, shortness of breath  Physical Exam  BP (!) 150/59 (BP Location: Right Arm)   Pulse 73   Temp 98.3 F (36.8 C)   Resp 13   Ht 5' (1.524 m)   Wt 83.9 kg   SpO2 94%   BMI 36.12 kg/m  Gen:   Awake, no distress   Resp:  Normal effort  MSK:   Moves extremities without difficulty  Other:    Medical Decision Making  Medically screening exam initiated at 5:03 PM.  Appropriate orders placed.  April Singleton Patient was informed that the remainder of the evaluation will be completed by another provider, this initial triage assessment does not replace that evaluation, and the importance of remaining in the ED until their evaluation is complete.  Labs ordered   April Singleton SAILOR, PA-C 10/08/24 1704

## 2024-10-09 ENCOUNTER — Emergency Department (HOSPITAL_COMMUNITY)

## 2024-10-09 LAB — TROPONIN T, HIGH SENSITIVITY
Troponin T High Sensitivity: <15 ng/L (ref 0–19)
Troponin T High Sensitivity: <15 ng/L (ref 0–19)

## 2024-10-09 MED ORDER — HYDROCHLOROTHIAZIDE 12.5 MG PO TABS
12.5000 mg | ORAL_TABLET | Freq: Once | ORAL | Status: AC
  Administered 2024-10-09: 12.5 mg via ORAL
  Filled 2024-10-09: qty 1

## 2024-10-09 MED ORDER — AMOXICILLIN-POT CLAVULANATE 875-125 MG PO TABS: 1.0000 | ORAL_TABLET | Freq: Two times a day (BID) | ORAL | 0 refills | Status: AC

## 2024-10-09 MED ORDER — ONDANSETRON HCL 4 MG/2ML IJ SOLN: 4.0000 mg | Freq: Once | INTRAMUSCULAR | Status: DC

## 2024-10-09 MED ORDER — FOSFOMYCIN TROMETHAMINE 3 G PO PACK
3.0000 g | PACK | Freq: Once | ORAL | Status: AC
  Administered 2024-10-09: 3 g via ORAL
  Filled 2024-10-09: qty 3

## 2024-10-09 MED ORDER — LOSARTAN POTASSIUM 50 MG PO TABS
50.0000 mg | ORAL_TABLET | ORAL | Status: AC
  Administered 2024-10-09: 50 mg via ORAL
  Filled 2024-10-09: qty 1

## 2024-10-09 MED ORDER — VERAPAMIL HCL ER 240 MG PO TBCR
240.0000 mg | EXTENDED_RELEASE_TABLET | ORAL | Status: AC
  Administered 2024-10-09: 240 mg via ORAL
  Filled 2024-10-09: qty 1

## 2024-10-09 NOTE — Discharge Instructions (Addendum)
 Please follow-up with your urologist and primary care provider.  Seek emergency care if experiencing any new or worsening symptoms.

## 2024-10-09 NOTE — ED Notes (Signed)
 Pt upset that she wasn't admitted for IV Abx. She reports continued vomiting and diarrhea ( one episode since 0700 hrs). Offered anti-emetics. But she stated that she just wants to go home even though she isnt feeling great. She states  I rather die at home than come to the er. Acknowledge pt's feelings and informed her that this RN will talk to provider about her concerns.

## 2024-10-09 NOTE — ED Provider Notes (Signed)
 " [place] EMERGENCY DEPARTMENT AT [facility] Provider Note   CSN: [id number] Arrival date & time: 10/08/24  1634     Patient presents with: Urinary Frequency   April Singleton is a 74 y.o. female with history of nephrolithiasis, hypertension, GERD, diastolic dysfunction, diabetes, depression, asthma, arthritis, UTI due to drug-resistant ESBL producing Klebsiella pneumonia.  Presents to ED complaining of dysuria, urinary urgency as well as chest pain that was noted upon patient arrival to her stretcher.  Patient reports that she has had a chronic UTI for the last 6 months.  Reports that she was hospitalized at 1 point and received IV antibiotics due to her UTI being multidrug-resistant Klebsiella pneumonia.  Patient received multiple rounds of ermapenem and was discharged home.  Patient followed up with urology on 07/19/2024.  At that visit, it was noted in her chart that on 04/21/2024 patient was started on low-dose antibiotic prophylaxis and was doing much better on antibiotic however at follow-up visit on 06/22/2024 she was complaining of 1 week of frequency, urgency, dysuria, pelvic pressure and low back pain.  Patient has objective fevers and chills.  Patient had nitrite positive with 2+ leukocytes, culture subsequently grew 50-100 multidrug-resistant Klebsiella which was sensitive to Augmentin  which was started.  Patient wanting later complaining of worsening symptoms and requested antibiotic change however there are no effective p.o. alternatives secondary to multidrug-resistant bacteria.  Patient was admitted to [facility] on 07/08/2024 and started on IV antibiotic therapy.  He was seen by ID who felt she had chronic colonization, low-dose vaginal estrogen, cranberry and probiotics were recommended.  Patient discharged on 07/09/2024 with PICC line and additional 5 days of IV antibiotics.  Patient reports here complaining of subjective fevers at home over the last 2 days.  Reports dysuria, burning  with urination, frequency.  Denies abdominal pain nausea or vomiting.  Patient also reported that she developed chest pain, palpitations on the left side of her chest without radiation upon arrival to stretcher.  Attributes this to anxiety, blood pressure, wait time that was long in ED waiting room.  States she was short of breath as well.  Reports this episode lasted 1 minute then resolved.  Denies history of the same.  Denies lightheadedness or dizziness or weakness.   Urinary Frequency Associated symptoms include chest pain.       Prior to Admission medications  Medication Sig Start Date End Date Taking? Authorizing Provider  acetaminophen  (TYLENOL ) 500 MG tablet Take 1,000 mg by mouth every 6 (six) hours as needed.    [provider]  amoxicillin -clavulanate (AUGMENTIN ) 875-125 MG tablet Take 1 tablet by mouth every 12 (twelve) hours. 08/13/24   Stoioff, Glendia BROCKS, MD  Blood Glucose Monitoring Suppl (BLOOD GLUCOSE MONITOR SYSTEM) w/Device KIT Use as directed 12/24/23   Tobie Solo K, MD  celecoxib  (CELEBREX ) 100 MG capsule Take 1 capsule (100 mg total) by mouth 2 (two) times daily. 09/01/24     clotrimazole -betamethasone  (LOTRISONE ) cream Apply 1 Application topically daily as needed. 03/08/24   Tapia, Leisa, PA-C  estradiol  (ESTRACE ) 1 MG tablet Take 1 tablet (1 mg total) by mouth daily. 10/15/23     ezetimibe  (ZETIA ) 10 MG tablet Take 1 tablet (10 mg total) by mouth daily. 05/17/24 02/11/25  Darliss Rogue, MD  fluconazole  (DIFLUCAN ) 150 MG tablet Take 1 tablet (150 mg total) by mouth every 3 (three) days as needed (for vaginal itching/yeast infection sx). 04/13/24   Tapia, Leisa, PA-C  fluticasone  (FLONASE ) 50 MCG/ACT nasal  spray Place 2 sprays into both nostrils daily. 08/03/24   Bernardo Fend, DO  gabapentin  (NEURONTIN ) 100 MG capsule Take 1 capsule (100 mg total) by mouth 2 (two) times daily. If no improvement after 5 days increase to 3 times daily 12/11/23     glucose blood  (ONETOUCH VERIO) test strip Use as directed. 08/02/24   Sowles, Krichna, MD  hydrochlorothiazide  (HYDRODIURIL ) 12.5 MG tablet Take 1 tablet (12.5 mg total) by mouth daily. 03/08/24   Tapia, Leisa, PA-C  hydrochlorothiazide  (HYDRODIURIL ) 12.5 MG tablet Take 1 tablet (12.5 mg total) by mouth daily. 08/03/24   Bernardo Fend, DO  lamoTRIgine  (LAMICTAL ) 100 MG tablet Take 1 tablet (100 mg total) by mouth daily. 05/06/24     lamoTRIgine  (LAMICTAL ) 25 MG tablet Take 2 tablets (50 mg total) by mouth daily. 08/30/24     losartan  (COZAAR ) 50 MG tablet Take 1 tablet (50 mg total) by mouth daily. 07/31/24   Tapia, Leisa, PA-C  metFORMIN  (GLUCOPHAGE -XR) 500 MG 24 hr tablet Take 1 tablet (500 mg total) by mouth 2 (two) times daily. 07/31/24   Tapia, Leisa, PA-C  ondansetron  (ZOFRAN -ODT) 4 MG disintegrating tablet Take 1 tablet (4 mg total) by mouth every 8 (eight) hours as needed for nausea or vomiting. 04/05/24   Tapia, Leisa, PA-C  OneTouch UltraSoft 2 Lancets MISC Use as directed. 12/24/23   Patel, Mayur K, MD  pantoprazole  (PROTONIX ) 40 MG tablet Take 1 tablet (40 mg total) by mouth daily. 08/03/24   Bernardo Fend, DO  QUEtiapine  (SEROQUEL ) 25 MG tablet Take 1 tablet (25 mg total) by mouth at bedtime. 08/03/24     rosuvastatin  (CRESTOR ) 5 MG tablet Take 1 tablet (5 mg total) by mouth daily. 06/30/24   Vivienne Lonni Ingle, NP  Semaglutide , 1 MG/DOSE, (OZEMPIC , 1 MG/DOSE,) 4 MG/3ML SOPN Inject 1 mg into the skin once a week. 07/31/24   Tapia, Leisa, PA-C  sertraline  (ZOLOFT ) 100 MG tablet Take 2 tablets (200 mg total) by mouth daily with breakfast. 02/04/24     sertraline  (ZOLOFT ) 100 MG tablet Take 2 tablets (200 mg total) by mouth daily with breakfast. 08/03/24     silver  sulfADIAZINE  (SILVADENE ) 1 % cream Apply 1 Application topically daily. 10/05/24     traZODone  (DESYREL ) 100 MG tablet Take 1 tablet (100 mg total) by mouth at bedtime as needed. 08/03/24     verapamil  (CALAN -SR) 240 MG CR tablet Take 1 tablet  (240 mg total) by mouth daily. 07/31/24   Tapia, Leisa, PA-C  Vibegron  (GEMTESA ) 75 MG TABS Take 1 tablet (75 mg total) by mouth daily. 08/13/24   Stoioff, Glendia BROCKS, MD    Allergies: Codeine, Prednisone , and Sulfa antibiotics    Review of Systems  Cardiovascular:  Positive for chest pain.  Genitourinary:  Positive for dysuria and frequency.  All other systems reviewed and are negative.   Updated Vital Signs BP (!) 186/87   Pulse 74   Temp 98.3 F (36.8 C) (Oral)   Resp 20   Ht 5' (1.524 m)   Wt 83.9 kg   SpO2 100%   BMI 36.12 kg/m   Physical Exam Vitals and nursing note reviewed.  Constitutional:      General: She is not in acute distress.    Appearance: She is well-developed.  HENT:     Head: Normocephalic and atraumatic.  Eyes:     Conjunctiva/sclera: Conjunctivae normal.  Cardiovascular:     Rate and Rhythm: Normal rate and regular rhythm.  Heart sounds: No murmur heard. Pulmonary:     Effort: Pulmonary effort is normal. No respiratory distress.     Breath sounds: Normal breath sounds.  Abdominal:     Palpations: Abdomen is soft.     Tenderness: There is no abdominal tenderness.  Musculoskeletal:        General: No swelling.     Cervical back: Neck supple.  Skin:    General: Skin is warm and dry.     Capillary Refill: Capillary refill takes less than 2 seconds.  Neurological:     Mental Status: She is alert and oriented to person, place, and time. Mental status is at baseline.  Psychiatric:        Mood and Affect: Mood normal.     (all labs ordered are listed, but only abnormal results are displayed) Labs Reviewed  COMPREHENSIVE METABOLIC PANEL WITH GFR - Abnormal; Notable for the following components:      Result Value   Glucose, Bld 103 (*)    All other components within normal limits  URINALYSIS, W/ REFLEX TO CULTURE (INFECTION SUSPECTED) - Abnormal; Notable for the following components:   Color, Urine AMBER (*)    Nitrite POSITIVE (*)     Bacteria, UA RARE (*)    All other components within normal limits  URINE CULTURE  CBC WITH DIFFERENTIAL/PLATELET  TROPONIN T, HIGH SENSITIVITY  TROPONIN T, HIGH SENSITIVITY    EKG: EKG Interpretation Date/Time:  Saturday October 09 2024 05:06:01 EST Ventricular Rate:  68 PR Interval:  170 QRS Duration:  90 QT Interval:  406 QTC Calculation: 431 R Axis:   34  Text Interpretation: Normal sinus rhythm Normal ECG No significant change since last tracing Confirmed by Emil Share [phone]) on 10/09/2024 5:22:07 AM  Radiology: DG Chest 2 View Result Date: 10/09/2024 CLINICAL DATA:  Chest pain EXAM: CHEST - 2 VIEW COMPARISON:  03/22/2024 FINDINGS: The lungs are clear without focal pneumonia, edema, pneumothorax or pleural effusion. Cardiopericardial silhouette is at upper limits of normal for size. No acute bony abnormality. IMPRESSION: No active cardiopulmonary disease. Electronically Signed   By: Camellia Candle M.D.   On: 10/09/2024 06:04    Procedures   Medications Ordered in the ED  hydrochlorothiazide  (HYDRODIURIL ) tablet 12.5 mg (12.5 mg Oral Given 10/09/24 0606)  verapamil  (CALAN -SR) CR tablet 240 mg (240 mg Oral Given 10/09/24 0606)  losartan  (COZAAR ) tablet 50 mg (50 mg Oral Given 10/09/24 0606)  fosfomycin  (MONUROL ) packet 3 g (3 g Oral Given 10/09/24 0606)    Medical Decision Making Amount and/or Complexity of Data Reviewed Radiology: ordered. ECG/medicine tests: ordered.  Risk Prescription drug management.   74 year old female presents for evaluation.  On exam, HD stable.  Lung sounds are clear bilaterally, no hypoxia.  Abdomen soft and compressible.  Neuroexam at baseline.  Overall nontoxic in appearance.  Patient presents with urinary frequency and dysuria.  Chart reviewed.  Patient seems to have complicated urological history.  ID at [facility] where patient was recently seen in October felt that her UTI was due to chronic colonization.  Here, urinalysis reveals nitrite positive  urine, bacteria, no leukocytes.  Will culture patient urine.  Given fosfomycin  here.  She has no white count here.  No tachycardia or fever. Patient advised she will most likely to follow-up with urology as outpatient for further care.  Terms of his and chest pain, reports that this occurred immediately upon arrival to stretcher.  Labs pulled.  Troponin is undetectable initially but will require delta  troponin.  CBC is without leukocytosis or anemia.  Metabolic panel is gross unremarkable.  Chest x-ray unremarkable.  EKG normal sinus rhythm.  Patient given blood pressure medication.  At this time patient requires delta troponin.  Will sign out to oncoming provider [facility] pending delta troponin.  Plan of management discussed.    Final diagnoses:  Urinary frequency  Chest pain, unspecified type    ED Discharge Orders     None          Ruthell Lonni FALCON, PA-C 10/09/24 9366    Emil Share, DO 10/09/24 9364  "

## 2024-10-09 NOTE — ED Provider Notes (Signed)
 Accepted handoff at shift change from [place] PA-C. Please see prior provider note for more detail.   Briefly: Patient is 74 y.o.  Presents to ED complaining of dysuria, urinary urgency as well as chest pain that was noted upon patient arrival to her stretcher.  Patient reports that she has had a chronic UTI for the last 6 months.  Reports that she was hospitalized at 1 point and received IV antibiotics due to her UTI being multidrug-resistant Klebsiella pneumonia.  Patient received multiple rounds of ermapenem and was discharged home.  Patient followed up with urology on 07/19/2024.  At that visit, it was noted in her chart that on 04/21/2024 patient was started on low-dose antibiotic prophylaxis and was doing much better on antibiotic however at follow-up visit on 06/22/2024 she was complaining of 1 week of frequency, urgency, dysuria, pelvic pressure and low back pain.  Patient has objective fevers and chills.  Patient had nitrite positive with 2+ leukocytes, culture subsequently grew 50-100 multidrug-resistant Klebsiella which was sensitive to Augmentin  which was started.  Patient wanting later complaining of worsening symptoms and requested antibiotic change however there are no effective p.o. alternatives secondary to multidrug-resistant bacteria.  Patient was admitted to [facility] on 07/08/2024 and started on IV antibiotic therapy.  He was seen by ID who felt she had chronic colonization, low-dose vaginal estrogen, cranberry and probiotics were recommended.  Patient discharged on 07/09/2024 with PICC line and additional 5 days of IV antibiotics.  Patient reports here complaining of subjective fevers at home over the last 2 days.  Reports dysuria, burning with urination, frequency.  Denies abdominal pain nausea or vomiting.  Patient also reported that she developed chest pain, palpitations on the left side of her chest without radiation upon arrival to stretcher.  Attributes this to anxiety, blood pressure, wait time  that was long in ED waiting room.  States she was short of breath as well.  Reports this episode lasted 1 minute then resolved.  Denies history of the same.  Denies lightheadedness or dizziness or weakness.   Plan:  - dispo pending delta trop - CBC without leukocytosis or anemia.  CMP with mild hyperglycemia at 103.  Delta troponin reassuring.  UA concerning for infection with positive nitrites and rare bacteria.  Chest x-ray without acute cardiopulmonary disease.  EKG sinus rhythm. - Prior provider ordered fosfomycin  for patient.  Patient requesting a course of Augmentin  stating that she vomited the dose of fosfomycin . Patient stating that she is ready to go home. We offered her anti-emetics multiple times which she declined stating that she just wants to go home. Per chart review, prior urine cultures susceptible to Augmentin  so I will provide patient with paper prescription.  Patient declining wanting home anti-emetics as well. Recommended following up with PCP and urologist.   - Staffed with Dr. Doretha. - Patient afebrile with stable vitals.  Provided with return precautions.  Discharged in good condition.   Hoy Nidia FALCON, [place] 10/09/24 9063    Doretha Folks, MD 10/09/24 1331

## 2024-10-11 ENCOUNTER — Other Ambulatory Visit: Payer: Self-pay

## 2024-10-11 LAB — URINE CULTURE: Culture: 50000 — AB

## 2024-10-11 MED ORDER — AMOXICILLIN-POT CLAVULANATE 875-125 MG PO TABS
1.0000 | ORAL_TABLET | Freq: Two times a day (BID) | ORAL | 0 refills | Status: AC
  Filled 2024-10-11: qty 14, 7d supply, fill #0

## 2024-10-12 ENCOUNTER — Other Ambulatory Visit: Payer: Self-pay

## 2024-10-12 ENCOUNTER — Other Ambulatory Visit: Payer: Self-pay | Admitting: Family Medicine

## 2024-10-12 ENCOUNTER — Other Ambulatory Visit: Payer: Self-pay | Admitting: Internal Medicine

## 2024-10-12 ENCOUNTER — Telehealth (HOSPITAL_BASED_OUTPATIENT_CLINIC_OR_DEPARTMENT_OTHER): Payer: Self-pay | Admitting: *Deleted

## 2024-10-12 DIAGNOSIS — R300 Dysuria: Secondary | ICD-10-CM

## 2024-10-12 DIAGNOSIS — N390 Urinary tract infection, site not specified: Secondary | ICD-10-CM

## 2024-10-12 DIAGNOSIS — I10 Essential (primary) hypertension: Secondary | ICD-10-CM

## 2024-10-12 DIAGNOSIS — Z5181 Encounter for therapeutic drug level monitoring: Secondary | ICD-10-CM

## 2024-10-12 DIAGNOSIS — E1165 Type 2 diabetes mellitus with hyperglycemia: Secondary | ICD-10-CM

## 2024-10-12 NOTE — Telephone Encounter (Signed)
 Post ED Visit - Positive Culture Follow-up:  Patient Follow-Up  Culture assessed and recommendations reviewed by:  [x]  Leonor Bash Pharm.D. []  Venetia Gully, Pharm.D., BCPS AQ-ID []  Garrel Crews, Pharm.D., BCPS []  Almarie Lunger, Pharm.D., BCPS []  [place], [address].D., BCPS, AAHIVP []  Rosaline Bihari, Pharm.D., BCPS, AAHIVP []  Vernell Meier, PharmD, BCPS []  Latanya Hint, PharmD, BCPS []  Donald Medley, PharmD, BCPS []  Rocky Bold, PharmD  Positive urine culture reviewed by [name]. Patient contacted by phone. At the beginning of the conversation, the patient stated she had been improving. She then asked which organism was identified in her urine culture. I informed her the organism was Klebsiella pneumoniae.  Upon hearing this, the patient stated she knows she needs IV antibiotics but reported she would not come to [name]. She stated she was not feeling her best. I advised her to return to the Emergency Department for further evaluation and treatment.  The patient stated, [name] will not admit me. I dont want to wait or waste time. I explained that the purpose of my call was to assess how she was feeling and to advise her to return to the ED for proper treatment if she was not improving.  The patient replied, I will when I feel bad enough. I can get septic and die from this and [name] will not give me IV antibiotics. I again advised her that if her symptoms were not improving, she should return to the ED, where treatment would include IV antibiotics if indicated.  I informed the patient that while she should come in for therapy, I could not guarantee wait times, as wait times are prolonged due to peak season; however, we are working hard to provide care to all patients in our community.  The patient expressed dissatisfaction with her previous visit, stating she felt she should have been treated as an inpatient. She stated she would only return if she felt sick enough.  []  Patient discharged  without antimicrobial prescription and treatment is now indicated [x]  Organism is resistant to prescribed ED discharge antimicrobial []  Patient with positive blood cultures    April Singleton 10/12/2024, 12:12 PM

## 2024-10-13 ENCOUNTER — Other Ambulatory Visit: Payer: Self-pay

## 2024-10-13 NOTE — Telephone Encounter (Signed)
 Requested medication (s) are due for refill today: yes  Requested medication (s) are on the active medication list: yes  Last refill:  10/15/23  Future visit scheduled: no  Notes to clinic:  Unable to refill per protocol, last refill by another provider.      Requested Prescriptions  Pending Prescriptions Disp Refills   estradiol  (ESTRACE ) 1 MG tablet 90 tablet 1    Sig: Take 1 tablet (1 mg total) by mouth daily.     OB/GYN:  Estrogens Failed - 10/13/2024  8:57 AM      Failed - Last BP in normal range    BP Readings from Last 1 Encounters:  10/09/24 (!) 153/70         Passed - Mammogram is up-to-date per Health Maintenance      Passed - Valid encounter within last 12 months    Recent Outpatient Visits           2 months ago Hospital discharge follow-up   [facility] Bernardo Fend, DO   6 months ago Dysuria   [facility] Leavy Mole, PA-C   6 months ago Dysuria   [facility] Leavy Mole, PA-C   7 months ago Encounter for medical examination to establish care   [facility] Leavy Mole, PA-C       Future Appointments             In 1 month Vivienne, Lonni Ingle, NP [facility]

## 2024-10-13 NOTE — Telephone Encounter (Signed)
 Requested medication (s) are due for refill today: routing for review  Requested medication (s) are on the active medication list: yes  Last refill:  08/03/24  Future visit scheduled: yes  Notes to clinic:   Medication not assigned to a protocol, review manually.      Requested Prescriptions  Pending Prescriptions Disp Refills   fluconazole  (DIFLUCAN ) 150 MG tablet 4 tablet 0    Sig: Take 1 tablet (150 mg total) by mouth every 3 (three) days as needed (for vaginal itching/yeast infection sx).     Off-Protocol Failed - 10/13/2024  9:11 AM      Failed - Medication not assigned to a protocol, review manually.      Passed - Valid encounter within last 12 months    Recent Outpatient Visits           2 months ago Hospital discharge follow-up   [facility] Bernardo Fend, DO   6 months ago Dysuria   [facility] Leavy Mole, PA-C   6 months ago Dysuria   [facility] Leavy Mole, PA-C   7 months ago Encounter for medical examination to establish care   [facility] Le[place]ole, [place]       Future Appointments             In 1 month Vivienne, Lonni Ingle, NP [place] [facility] VERIO test strip [facility] Med Name: glucose blood (ONETOUCH VERIO) test strip] 100 each 0    Sig: Use as directed.     Endocrinology: Diabetes - Testing Supplies Passed - 10/13/2024  9:11 AM      Passed - Valid encounter within last 12 months    Recent Outpatient Visits           2 months ago Hospital discharge follow-up   [facility] Bernardo Fend, DO   6 months ago Dysuria   [facility] Leavy Mole, PA-C   6 months ago Dysuria   [facility] Leavy Mole, PA-C   7 months ago Encounter for medical examination to establish care   [facility] Le[place] Mole, [place]       Future Appointments             In 1 month Vivienne, Lonni Ingle, NP  [facility] at [facility]             estradiol  (ESTRACE ) 1 MG tablet 90 tablet 1    Sig: Take 1 tablet (1 mg total) by mouth daily.     OB/GYN:  Estrogens Failed - 10/13/2024  9:11 AM      Failed - Last BP in normal range    BP Readings from Last 1 Encounters:  10/09/24 (!) 153/70         Passed - Mammogram is up-to-date per Health Maintenance      Passed - Valid encounter within last 12 months    Recent Outpatient Visits           2 months ago Hospital discharge follow-up   [facility] Bernardo Fend, DO   6 months ago Dysuria   [facility] Leavy Mole, PA-C   6 months ago Dysuria   [facility] Leavy Mole, PA-C   7 months ago Encounter  for medical examination to establish care   [facility] Leavy Mole, [place]       Future Appointments             In 1 month Vivienne, Lonni Ingle, NP [place] [facility]             hydrochlorothiazide  (HYDRODIURIL ) 12.5 MG tablet [Pharmacy Med Name: hydroCHLOROthiazide  12.5 MG Oral Tablet] 90 tablet 1    Sig: Take 1 tablet (12.5 mg total) by mouth daily.     Cardiovascular: Diuretics - Thiazide Failed - 10/13/2024  9:11 AM      Failed - Last BP in normal range    BP Readings from Last 1 Encounters:  10/09/24 (!) 153/70         Passed - Cr in normal range and within 180 days    Creat  Date Value Ref Range Status  03/08/2024 0.81 0.60 - 1.00 mg/dL Final   Creatinine, Ser  Date Value Ref Range Status  10/08/2024 0.84 0.44 - 1.00 mg/dL Final   Creatinine, Urine  Date Value Ref Range Status  03/08/2024 190 20 - 275 mg/dL Final         Passed - K in normal range and within 180 days    Potassium  Date Value Ref Range Status  10/08/2024 4.3 3.5 - 5.1 mmol/L  Final  01/05/2014 3.9 3.5 - 5.1 mmol/L Final         Passed - Na in normal range and within 180 days    Sodium  Date Value Ref Range Status  10/08/2024 139 135 - 145 mmol/L Final  05/17/2024 137 134 - 144 mmol/L Final  01/05/2014 135 (L) 136 - 145 mmol/L Final         Passed - Valid encounter within last 6 months    Recent Outpatient Visits           2 months ago Hospital discharge follow-up   [facility] Bernardo Fend, DO   6 months ago Dysuria   [facility] Leavy Mole, PA-C   6 months ago Dysuria   [facility] Leavy Mole, PA-C   7 months ago Encounter for medical examination to establish care   [facility] Leavy Mole, [place]       Future Appointments             In 1 month Vivienne, Lonni Ingle, NP [place] [facility]             OZEMPIC , 1 MG/DOSE, 4 MG/3ML SOPN [Pharmacy Med Name: Semaglutide , 1 MG/DOSE, (OZEMPIC , 1 MG/DOSE,) 4 MG/3ML Solution Pen-injector] 9 mL 0    Sig: Inject 1 mg into the skin once a week.     Endocrinology:  Diabetes - GLP-1 Receptor Agonists - semaglutide  Failed - 10/13/2024  9:11 AM      Failed - HBA1C in normal range and within 180 days    Hemoglobin A1C  Date Value Ref Range Status  08/03/2024 6.0 (A) 4.0 - 5.6 % Final  09/03/2012 5.7 4.2 - 6.3 % Final    Comment:    The American Diabetes Association recommends that a primary goal of therapy should be <7% and that physicians should reevaluate the treatment regimen in patients with HbA1c values consistently >8%.    HbA1c POC (<> result, manual entry)  Date Value Ref Range Status  10/26/2020 7.2 4.0 - 5.6 % Final  Hgb A1c MFr Bld  Date Value Ref Range Status  03/08/2024 6.1 (H) <5.7 % Final    Comment:    For someone without known diabetes, a hemoglobin  A1c value between 5.7% and 6.4% is consistent with prediabetes and should be confirmed  with a  follow-up test. . For someone with known diabetes, a value <7% indicates that their diabetes is well controlled. A1c targets should be individualized based on duration of diabetes, age, comorbid conditions, and other considerations. . This assay result is consistent with an increased risk of diabetes. . Currently, no consensus exists regarding use of hemoglobin A1c for diagnosis of diabetes for children. .          Passed - Cr in normal range and within 360 days    Creat  Date Value Ref Range Status  03/08/2024 0.81 0.60 - 1.00 mg/dL Final   Creatinine, Ser  Date Value Ref Range Status  10/08/2024 0.84 0.44 - 1.00 mg/dL Final   Creatinine, Urine  Date Value Ref Range Status  03/08/2024 190 20 - 275 mg/dL Final         Passed - Valid encounter within last 6 months    Recent Outpatient Visits           2 months ago Hospital discharge follow-up   [facility] Bernardo Fend, DO   6 months ago Dysuria   [facility] Leavy Mole, PA-C   6 months ago Dysuria   [facility] Leavy Mole, PA-C   7 months ago Encounter for medical examination to establish care   [facility] Leavy Mole, PA-C       Future Appointments             In 1 month Vivienne, Lonni Ingle, NP [facility]

## 2024-10-14 ENCOUNTER — Other Ambulatory Visit: Payer: Self-pay

## 2024-10-14 ENCOUNTER — Ambulatory Visit
Admission: RE | Admit: 2024-10-14 | Discharge: 2024-10-14 | Disposition: A | Source: Ambulatory Visit | Attending: Internal Medicine

## 2024-10-14 ENCOUNTER — Encounter: Payer: Self-pay | Admitting: Internal Medicine

## 2024-10-14 ENCOUNTER — Ambulatory Visit
Admission: RE | Admit: 2024-10-14 | Discharge: 2024-10-14 | Disposition: A | Discharge status: Home / Self Care | Attending: Internal Medicine | Admitting: Internal Medicine

## 2024-10-14 ENCOUNTER — Ambulatory Visit (INDEPENDENT_AMBULATORY_CARE_PROVIDER_SITE_OTHER): Admitting: Internal Medicine

## 2024-10-14 ENCOUNTER — Other Ambulatory Visit (HOSPITAL_COMMUNITY)
Admission: RE | Admit: 2024-10-14 | Discharge: 2024-10-14 | Disposition: A | Discharge status: Home / Self Care | Source: Ambulatory Visit | Attending: Internal Medicine | Admitting: Internal Medicine

## 2024-10-14 VITALS — BP 134/82 | HR 85 | Temp 97.8°F | Resp 16 | Ht 60.0 in | Wt 194.3 lb

## 2024-10-14 DIAGNOSIS — R300 Dysuria: Secondary | ICD-10-CM

## 2024-10-14 DIAGNOSIS — N949 Unspecified condition associated with female genital organs and menstrual cycle: Secondary | ICD-10-CM | POA: Insufficient documentation

## 2024-10-14 DIAGNOSIS — M79672 Pain in left foot: Secondary | ICD-10-CM | POA: Diagnosis present

## 2024-10-14 LAB — POCT URINALYSIS DIPSTICK
Bilirubin, UA: NEGATIVE
Blood, UA: NEGATIVE
Glucose, UA: NEGATIVE
Ketones, UA: NEGATIVE
Nitrite, UA: NEGATIVE
Protein, UA: NEGATIVE
Spec Grav, UA: 1.02
Urobilinogen, UA: 0.2 U/dL
pH, UA: 5

## 2024-10-14 NOTE — Progress Notes (Signed)
 "  Acute Office Visit  Subjective:     Patient ID: April Singleton, female    DOB: [birth year], 74 y.o.   MRN: [id number]  Chief Complaint  Patient presents with   Dysuria    Urinary frequency, seen in ER   Foot Pain    Left foot for 2 weeks    Dysuria  Associated symptoms include chills, frequency and urgency. Pertinent negatives include no flank pain or hematuria.  Foot Pain Associated symptoms include chills. Pertinent negatives include no abdominal pain or fever.   Patient is in today for recheck of urine and foot pain.   Discussed the use of AI scribe software for clinical note transcription with the patient, who gave verbal consent to proceed.  History of Present Illness April Singleton is a 74 year old female who presents with recurrent urinary tract infections and foot swelling.  She has recurrent urinary tract infections. Recent urine culture grew resistant Klebsiella and group B Streptococcus. Recent urine dip showed no blood or nitrates with a small amount of leukocytes. She continues to have low-grade fever, burning, urgency, frequency, and bladder spasms.  She developed foot swelling a few weeks ago involving the side and top of the foot. She denies trauma. Swelling has improved slightly on the side but persists overall. She takes Celebrex  100 mg twice daily for inflammation. No recent foot x-rays.    Review of Systems  Constitutional:  Positive for chills. Negative for fever.  Gastrointestinal:  Negative for abdominal pain.  Genitourinary:  Positive for dysuria, frequency and urgency. Negative for flank pain and hematuria.  Musculoskeletal:  Positive for joint pain.  Skin: Negative.         Objective:    BP 134/82 (Cuff Size: Large)   Pulse 85   Temp 97.8 F (36.6 C) (Oral)   Resp 16   Ht 5' (1.524 m)   Wt 194 lb 4.8 oz (88.1 kg)   SpO2 98%   BMI 37.95 kg/m  BP Readings from Last 3 Encounters:  10/14/24 134/82  10/09/24 (!) 153/70  08/13/24 131/73    Wt Readings from Last 3 Encounters:  10/14/24 194 lb 4.8 oz (88.1 kg)  10/08/24 184 lb 15.5 oz (83.9 kg)  08/13/24 185 lb (83.9 kg)      Physical Exam Constitutional:      Appearance: Normal appearance.  HENT:     Head: Normocephalic and atraumatic.  Eyes:     Conjunctiva/sclera: Conjunctivae normal.  Cardiovascular:     Rate and Rhythm: Normal rate and regular rhythm.  Pulmonary:     Effort: Pulmonary effort is normal.     Breath sounds: Normal breath sounds.  Abdominal:     Tenderness: There is no right CVA tenderness or left CVA tenderness.  Musculoskeletal:        General: Swelling and tenderness present.  Feet:     Comments: Pain and swelling on the dorsal left midfoot, no skin changes Skin:    General: Skin is warm and dry.     Findings: No rash.  Neurological:     General: No focal deficit present.     Mental Status: She is alert. Mental status is at baseline.  Psychiatric:        Mood and Affect: Mood normal.        Behavior: Behavior normal.     No results found for any visits on 10/14/24.      Assessment & Plan:   Assessment & Plan Urinary  tract infection due to resistant organisms Recurrent UTI with resistant Klebsiella and group B strep.  - Sent urine for culture to confirm resolution. - Performed vaginal swab to rule out yeast or bacterial vaginosis. - Await culture and swab results before further treatment. - Consider hospitalization for IV antibiotics if culture positive for Klebsiella.  Left foot pain and swelling, possible arthritis Left foot pain and swelling without trauma. Swelling not indicative of infection. Possible tendon issue or severe arthritis. - Ordered x-ray of left foot. - Advised to avoid additional NSAIDs. - Consider referral to podiatrist if arthritis confirmed. - Discussed potential for joint injections if arthritis confirmed.  - POCT urinalysis dipstick - Urine Culture - Cervicovaginal ancillary only - DG Foot  Complete Left; Future   Return for will need TOC appointment, Leisa patient.  Sharyle Fischer, DO   "

## 2024-10-15 ENCOUNTER — Ambulatory Visit: Payer: Self-pay | Admitting: Internal Medicine

## 2024-10-15 ENCOUNTER — Other Ambulatory Visit: Payer: Self-pay

## 2024-10-15 DIAGNOSIS — R300 Dysuria: Secondary | ICD-10-CM

## 2024-10-15 DIAGNOSIS — B379 Candidiasis, unspecified: Secondary | ICD-10-CM

## 2024-10-15 DIAGNOSIS — N390 Urinary tract infection, site not specified: Secondary | ICD-10-CM

## 2024-10-15 LAB — CERVICOVAGINAL ANCILLARY ONLY
Bacterial Vaginitis (gardnerella): NEGATIVE
Candida Glabrata: NEGATIVE
Candida Vaginitis: POSITIVE — AB
Comment: NEGATIVE
Comment: NEGATIVE
Comment: NEGATIVE
Comment: NEGATIVE
Trichomonas: NEGATIVE

## 2024-10-15 MED ORDER — FLUCONAZOLE 150 MG PO TABS
150.0000 mg | ORAL_TABLET | ORAL | 0 refills | Status: AC | PRN
  Filled 2024-10-15: qty 4, 12d supply, fill #0

## 2024-10-16 ENCOUNTER — Other Ambulatory Visit: Payer: Self-pay

## 2024-10-16 LAB — URINE CULTURE
MICRO NUMBER:: 17474128
SPECIMEN QUALITY:: ADEQUATE

## 2024-10-18 ENCOUNTER — Other Ambulatory Visit: Payer: Self-pay

## 2024-10-18 ENCOUNTER — Encounter: Payer: Self-pay | Admitting: Internal Medicine

## 2024-10-19 ENCOUNTER — Other Ambulatory Visit: Payer: Self-pay

## 2024-10-19 MED ORDER — CIPROFLOXACIN HCL 500 MG PO TABS
500.0000 mg | ORAL_TABLET | Freq: Two times a day (BID) | ORAL | 0 refills | Status: AC
  Filled 2024-10-19: qty 10, 5d supply, fill #0

## 2024-10-25 ENCOUNTER — Encounter: Payer: Self-pay | Admitting: Internal Medicine

## 2024-10-25 ENCOUNTER — Other Ambulatory Visit: Payer: Self-pay

## 2024-10-25 DIAGNOSIS — Z5181 Encounter for therapeutic drug level monitoring: Secondary | ICD-10-CM

## 2024-10-25 DIAGNOSIS — E1165 Type 2 diabetes mellitus with hyperglycemia: Secondary | ICD-10-CM

## 2024-10-26 ENCOUNTER — Other Ambulatory Visit: Payer: Self-pay

## 2024-10-26 MED ORDER — TROSPIUM CHLORIDE 20 MG PO TABS
20.0000 mg | ORAL_TABLET | Freq: Two times a day (BID) | ORAL | 11 refills | Status: AC
  Filled 2024-10-26: qty 60, 30d supply, fill #0

## 2024-10-26 MED ORDER — OZEMPIC (1 MG/DOSE) 4 MG/3ML ~~LOC~~ SOPN
1.0000 mg | PEN_INJECTOR | SUBCUTANEOUS | 0 refills | Status: AC
  Filled 2024-10-26: qty 9, 84d supply, fill #0

## 2024-11-03 ENCOUNTER — Other Ambulatory Visit: Payer: Self-pay

## 2024-11-03 ENCOUNTER — Other Ambulatory Visit: Payer: Self-pay | Admitting: Internal Medicine

## 2024-11-03 MED ORDER — LAMOTRIGINE 25 MG PO TABS
50.0000 mg | ORAL_TABLET | Freq: Every day | ORAL | 0 refills | Status: AC
  Filled 2024-11-03: qty 44, 22d supply, fill #0
  Filled 2024-11-03: qty 46, 23d supply, fill #0

## 2024-11-03 MED ORDER — QUETIAPINE FUMARATE 25 MG PO TABS
25.0000 mg | ORAL_TABLET | Freq: Every day | ORAL | 0 refills | Status: AC
  Filled 2024-11-03: qty 90, 90d supply, fill #0

## 2024-11-03 MED ORDER — TRAZODONE HCL 100 MG PO TABS
100.0000 mg | ORAL_TABLET | Freq: Every evening | ORAL | 0 refills | Status: AC | PRN
  Filled 2024-11-03: qty 90, 90d supply, fill #0

## 2024-11-03 MED ORDER — SERTRALINE HCL 100 MG PO TABS
200.0000 mg | ORAL_TABLET | Freq: Every day | ORAL | 0 refills | Status: AC
  Filled 2024-11-03: qty 180, 90d supply, fill #0

## 2024-11-04 ENCOUNTER — Other Ambulatory Visit: Payer: Self-pay

## 2024-11-04 NOTE — Telephone Encounter (Signed)
 Requested medication (s) are due for refill today: yes  Requested medication (s) are on the active medication list: yes  Last refill:  10/15/23  Future visit scheduled: {no  Notes to clinic:  Unable to refill per protocol, last refill by another provider. Routing to PCP for approval.     Requested Prescriptions  Pending Prescriptions Disp Refills   estradiol  (ESTRACE ) 1 MG tablet 90 tablet 1    Sig: Take 1 tablet (1 mg total) by mouth daily.     OB/GYN:  Estrogens Passed - 11/04/2024  3:10 PM      Passed - Mammogram is up-to-date per Health Maintenance      Passed - Last BP in normal range    BP Readings from Last 1 Encounters:  10/14/24 134/82         Passed - Valid encounter within last 12 months    Recent Outpatient Visits           3 weeks ago Dysuria   [facility] Bernardo Fend, DO   3 months ago Hospital discharge follow-up   [facility] Bernardo Fend, DO   6 months ago Dysuria   [facility] Leavy Mole, PA-C   7 months ago Dysuria   [facility] Leavy Mole, PA-C   8 months ago Encounter for medical examination to establish care   [facility] Leavy Mole, PA-C       Future Appointments             In 1 month Vivienne, Lonni Ingle, NP [facility]

## 2024-11-15 ENCOUNTER — Inpatient Hospital Stay

## 2024-11-16 ENCOUNTER — Inpatient Hospital Stay

## 2024-11-16 ENCOUNTER — Inpatient Hospital Stay: Admitting: Oncology

## 2024-12-08 ENCOUNTER — Ambulatory Visit: Admitting: Nurse Practitioner

## 2024-12-23 ENCOUNTER — Encounter: Admitting: Internal Medicine

## 2025-01-31 ENCOUNTER — Ambulatory Visit: Admitting: Internal Medicine
